# Patient Record
Sex: Female | Born: 1935 | Race: White | Hispanic: No | Smoking: Former smoker
Health system: Southern US, Community
[De-identification: ages and names within clinical notes are randomized; demographics above are authoritative.]

## PROBLEM LIST (undated history)

## (undated) DIAGNOSIS — L03039 Cellulitis of unspecified toe: Secondary | ICD-10-CM

## (undated) DIAGNOSIS — F329 Major depressive disorder, single episode, unspecified: Secondary | ICD-10-CM

## (undated) DIAGNOSIS — D649 Anemia, unspecified: Secondary | ICD-10-CM

## (undated) DIAGNOSIS — I639 Cerebral infarction, unspecified: Secondary | ICD-10-CM

## (undated) DIAGNOSIS — I219 Acute myocardial infarction, unspecified: Secondary | ICD-10-CM

## (undated) DIAGNOSIS — I669 Occlusion and stenosis of unspecified cerebral artery: Secondary | ICD-10-CM

## (undated) DIAGNOSIS — N390 Urinary tract infection, site not specified: Secondary | ICD-10-CM

## (undated) DIAGNOSIS — F0390 Unspecified dementia without behavioral disturbance: Secondary | ICD-10-CM

## (undated) DIAGNOSIS — E785 Hyperlipidemia, unspecified: Secondary | ICD-10-CM

## (undated) DIAGNOSIS — K219 Gastro-esophageal reflux disease without esophagitis: Secondary | ICD-10-CM

## (undated) DIAGNOSIS — J45909 Unspecified asthma, uncomplicated: Secondary | ICD-10-CM

## (undated) DIAGNOSIS — N179 Acute kidney failure, unspecified: Secondary | ICD-10-CM

## (undated) DIAGNOSIS — I10 Essential (primary) hypertension: Secondary | ICD-10-CM

## (undated) DIAGNOSIS — M1990 Unspecified osteoarthritis, unspecified site: Secondary | ICD-10-CM

## (undated) DIAGNOSIS — R609 Edema, unspecified: Secondary | ICD-10-CM

## (undated) HISTORY — PX: CARDIAC SURGERY: SHX584

## (undated) HISTORY — DX: Cellulitis of unspecified toe: L03.039

## (undated) HISTORY — DX: Acute myocardial infarction, unspecified: I21.9

## (undated) HISTORY — DX: Edema, unspecified: R60.9

## (undated) HISTORY — DX: Acute kidney failure, unspecified: N17.9

## (undated) HISTORY — PX: KNEE SURGERY: SHX244

## (undated) HISTORY — DX: Occlusion and stenosis of unspecified cerebral artery: I66.9

## (undated) HISTORY — DX: Urinary tract infection, site not specified: N39.0

## (undated) HISTORY — DX: Unspecified osteoarthritis, unspecified site: M19.90

## (undated) HISTORY — DX: Cerebral infarction, unspecified: I63.9

## (undated) HISTORY — DX: Unspecified asthma, uncomplicated: J45.909

## (undated) HISTORY — DX: Unspecified dementia, unspecified severity, without behavioral disturbance, psychotic disturbance, mood disturbance, and anxiety: F03.90

## (undated) HISTORY — DX: Anemia, unspecified: D64.9

---

## 2004-01-21 ENCOUNTER — Other Ambulatory Visit: Payer: Self-pay

## 2004-05-29 ENCOUNTER — Ambulatory Visit: Payer: Self-pay | Admitting: Internal Medicine

## 2006-01-22 ENCOUNTER — Ambulatory Visit: Payer: Self-pay | Admitting: Internal Medicine

## 2009-01-17 ENCOUNTER — Ambulatory Visit: Payer: Self-pay | Admitting: Internal Medicine

## 2009-02-21 ENCOUNTER — Other Ambulatory Visit: Payer: Self-pay | Admitting: Internal Medicine

## 2009-07-11 ENCOUNTER — Ambulatory Visit: Payer: Self-pay | Admitting: Internal Medicine

## 2010-03-01 ENCOUNTER — Encounter: Payer: Self-pay | Admitting: Internal Medicine

## 2010-06-27 ENCOUNTER — Ambulatory Visit: Payer: Medicare Other | Admitting: Family Medicine

## 2010-06-27 ENCOUNTER — Encounter: Payer: Self-pay | Admitting: Family Medicine

## 2010-06-27 DIAGNOSIS — H6120 Impacted cerumen, unspecified ear: Secondary | ICD-10-CM

## 2010-07-06 NOTE — Assessment & Plan Note (Signed)
Summary: EAR CLEANING/EVM   Vital Signs:  Patient Profile:   77 Years Old Female CC:      Feels like Ears are closed up. Height:     60 inches Weight:      170 pounds BMI:     33.32 O2 Sat:      97 % O2 treatment:    Room Air Temp:     98.2 degrees F oral Pulse rate:   73 / minute Pulse rhythm:   regular Resp:     18 per minute BP sitting:   135 / 78  (right arm)  Pt. in pain?   no  Vitals Entered By: Levonne Spiller EMT-P (June 27, 2010 5:00 PM)              Is Patient Diabetic? No  Does patient need assistance? Functional Status Self care Ambulation Normal      Current Allergies (reviewed today): ! * CODIENEHistory of Present Illness History from: patient Reason for visit: see chief complaint Chief Complaint: Feels like Ears are clogged up. History of Present Illness: The patient presented with her daughter because they were concerned that the patient had a buildup of wax in the ears.  She has had some decreased hearing sensation and was concerned.  No other problems to report at this time.  The patient did not bring or was able to recall her significant medical history including the medications that she was taking.  The daughter was also unable to provide the information.    REVIEW OF SYSTEMS Constitutional Symptoms      Denies fever, chills, night sweats, weight loss, weight gain, and fatigue.  Eyes       Denies change in vision, eye pain, eye discharge, glasses, contact lenses, and eye surgery. Ear/Nose/Throat/Mouth       Complains of hearing loss/aids.      Denies change in hearing, ear pain, ear discharge, dizziness, frequent runny nose, frequent nose bleeds, sinus problems, sore throat, hoarseness, and tooth pain or bleeding.      Comments: possible cerumen impaction Respiratory       Denies dry cough, productive cough, wheezing, shortness of breath, asthma, bronchitis, and emphysema/COPD.  Cardiovascular       Denies murmurs, chest pain, and tires easily  with exhertion.    Gastrointestinal       Denies stomach pain, nausea/vomiting, diarrhea, constipation, blood in bowel movements, and indigestion. Genitourniary       Denies painful urination, blood or discharge from vagina, kidney stones, and loss of urinary control. Neurological       Denies paralysis, seizures, and fainting/blackouts. Musculoskeletal       Denies muscle pain, joint pain, joint stiffness, decreased range of motion, redness, swelling, muscle weakness, and gout.  Skin       Denies bruising, unusual mles/lumps or sores, and hair/skin or nail changes.  Psych       Denies mood changes, temper/anger issues, anxiety/stress, speech problems, depression, and sleep problems.  Past History:  Past Medical History: Pt and daughter unable to recall  Past Surgical History: Pt and daughter present unable to recall  Family History: Pt denies significant family health problems.   Social History: Pt is 77 years old, living with daughters who provide care for her.   Physical Exam General appearance: well developed, well nourished, no acute distress Head: normocephalic, atraumatic Eyes: conjunctivae and lids normal Pupils: equal, round, reactive to light Ears: excessive cerumen bilateral Nasal: mild edema of the  turbinates Oral/Pharynx: moist mucous membranes Neurological: grossly intact and non-focal MSE: oriented to time, place, and person Assessment New Problems: CERUMEN IMPACTION, BILATERAL (ICD-380.4)   Patient Education: The risks, benefits and possible side effects were clearly explained and discussed with the patient.  The patient verbalized clear understanding.  The patient was given instructions to return if symptoms don't improve, worsen or new changes develop.  If it is not during clinic hours and the patient cannot get back to this clinic then the patient was told to seek medical care at an available urgent care or emergency department.  The patient verbalized  understanding.    Plan Planning Comments:   Ear lavage performed.  Follow Up: Follow up on an as needed basis, Follow up with Primary Physician  The patient and/or caregiver has been counseled thoroughly with regard to medications prescribed including dosage, schedule, interactions, rationale for use, and possible side effects and they verbalize understanding.  Diagnoses and expected course of recovery discussed and will return if not improved as expected or if the condition worsens. Patient and/or caregiver verbalized understanding.   PROCEDURE: Follow up: Bilateral Ear Lavage Performed in the office with gentle warm water and peroxide.  Large amounts of dried and hardened ear wax removed from both ears. Pt tolerated the procedure very well.  She said she felt much better after the procedure.  Both auditory canals and TMs were clear after visual inspection.  Care instructions were provided for the patient. The patient and her daughter verbalized clear understanding.    Patient Instructions: 1)  Be careful when walking and ambulating because you may be slightly dizzy or unsteady on your feet after having this procedure done.  Be EXTRA careful for the next 3 hours.  2)  See your primary care physician as scheduled for your next regular follow up exam. 3)  The patient was informed that there is no on-call provider or services available at this clinic during off-hours (when the clinic is closed).  If the patient developed a problem or concern that required immediate attention, the patient was advised to go the the nearest available urgent care or emergency department for medical care.  The patient verbalized understanding.

## 2011-05-24 LAB — CBC
HCT: 30.4 % — ABNORMAL LOW (ref 35.0–47.0)
HGB: 10.2 g/dL — ABNORMAL LOW (ref 12.0–16.0)
MCHC: 33.6 g/dL (ref 32.0–36.0)
MCV: 90 fL (ref 80–100)
Platelet: 215 10*3/uL (ref 150–440)
RDW: 13.8 % (ref 11.5–14.5)
WBC: 6.6 10*3/uL (ref 3.6–11.0)

## 2011-05-24 LAB — COMPREHENSIVE METABOLIC PANEL
Albumin: 3.1 g/dL — ABNORMAL LOW (ref 3.4–5.0)
Anion Gap: 10 (ref 7–16)
BUN: 20 mg/dL — ABNORMAL HIGH (ref 7–18)
Calcium, Total: 8.1 mg/dL — ABNORMAL LOW (ref 8.5–10.1)
Chloride: 92 mmol/L — ABNORMAL LOW (ref 98–107)
Co2: 32 mmol/L (ref 21–32)
EGFR (African American): 52 — ABNORMAL LOW
EGFR (Non-African Amer.): 43 — ABNORMAL LOW
Potassium: 3.5 mmol/L (ref 3.5–5.1)
SGOT(AST): 16 U/L (ref 15–37)
SGPT (ALT): 18 U/L
Total Protein: 6.4 g/dL (ref 6.4–8.2)

## 2011-05-24 LAB — TROPONIN I: Troponin-I: <0.02 ng/mL

## 2011-05-24 LAB — RAPID INFLUENZA A&B ANTIGENS

## 2011-05-25 ENCOUNTER — Inpatient Hospital Stay: Payer: Self-pay | Admitting: Student

## 2011-05-25 LAB — CK TOTAL AND CKMB (NOT AT ARMC)
CK, Total: 62 U/L (ref 21–215)
CK, Total: 58 U/L (ref 21–215)
CK-MB: 0.7 ng/mL (ref 0.5–3.6)

## 2011-05-25 LAB — URINALYSIS, COMPLETE
Blood: NEGATIVE
Hyaline Cast: 7
Ketone: NEGATIVE
Ph: 7 (ref 4.5–8.0)
Protein: NEGATIVE
Specific Gravity: 1.006 (ref 1.003–1.030)
WBC UR: 2 /HPF (ref 0–5)

## 2011-05-25 LAB — TROPONIN I: Troponin-I: <0.02 ng/mL

## 2011-05-26 LAB — CBC WITH DIFFERENTIAL/PLATELET
Basophil #: 0 10*3/uL (ref 0.0–0.1)
Basophil %: 0.1 %
Eosinophil #: 0 10*3/uL (ref 0.0–0.7)
HCT: 28.3 % — ABNORMAL LOW (ref 35.0–47.0)
HGB: 9.4 g/dL — ABNORMAL LOW (ref 12.0–16.0)
Lymphocyte %: 8.7 %
MCHC: 33.2 g/dL (ref 32.0–36.0)
Monocyte #: 1.1 10*3/uL — ABNORMAL HIGH (ref 0.0–0.7)
Monocyte %: 8.9 %
Neutrophil #: 9.7 10*3/uL — ABNORMAL HIGH (ref 1.4–6.5)
Neutrophil %: 82.2 %
RDW: 14.1 % (ref 11.5–14.5)
WBC: 11.8 10*3/uL — ABNORMAL HIGH (ref 3.6–11.0)

## 2011-05-26 LAB — MAGNESIUM: Magnesium: 1.8 mg/dL

## 2011-05-26 LAB — BASIC METABOLIC PANEL
Anion Gap: 10 (ref 7–16)
BUN: 20 mg/dL — ABNORMAL HIGH (ref 7–18)
Chloride: 94 mmol/L — ABNORMAL LOW (ref 98–107)
Co2: 29 mmol/L (ref 21–32)
Creatinine: 1.1 mg/dL (ref 0.60–1.30)
EGFR (African American): >60
Potassium: 3.4 mmol/L — ABNORMAL LOW (ref 3.5–5.1)
Sodium: 133 mmol/L — ABNORMAL LOW (ref 136–145)

## 2011-05-26 LAB — LIPID PANEL
HDL Cholesterol: 39 mg/dL — ABNORMAL LOW (ref 40–60)
Triglycerides: 111 mg/dL (ref 0–200)
VLDL Cholesterol, Calc: 22 mg/dL (ref 5–40)

## 2011-05-26 LAB — PROTIME-INR: INR: 1.1

## 2011-05-26 LAB — TSH: Thyroid Stimulating Horm: 1.03 u[IU]/mL

## 2011-05-26 LAB — HEMOGLOBIN A1C: Hemoglobin A1C: 5.9 % (ref 4.2–6.3)

## 2011-05-27 LAB — BASIC METABOLIC PANEL
Calcium, Total: 8.3 mg/dL — ABNORMAL LOW (ref 8.5–10.1)
Chloride: 94 mmol/L — ABNORMAL LOW (ref 98–107)
Creatinine: 1.21 mg/dL (ref 0.60–1.30)
EGFR (Non-African Amer.): 45 — ABNORMAL LOW
Glucose: 86 mg/dL (ref 65–99)
Osmolality: 268 (ref 275–301)
Potassium: 3.7 mmol/L (ref 3.5–5.1)
Sodium: 133 mmol/L — ABNORMAL LOW (ref 136–145)

## 2011-05-27 LAB — CBC WITH DIFFERENTIAL/PLATELET
Basophil #: 0 10*3/uL (ref 0.0–0.1)
Eosinophil #: 0.3 10*3/uL (ref 0.0–0.7)
HCT: 28.9 % — ABNORMAL LOW (ref 35.0–47.0)
Lymphocyte %: 16.6 %
MCH: 30.3 pg (ref 26.0–34.0)
Monocyte #: 0.9 10*3/uL — ABNORMAL HIGH (ref 0.0–0.7)
Monocyte %: 9.2 %
RDW: 14.4 % (ref 11.5–14.5)
WBC: 9.7 10*3/uL (ref 3.6–11.0)

## 2011-05-28 LAB — BASIC METABOLIC PANEL
BUN: 21 mg/dL — ABNORMAL HIGH (ref 7–18)
Calcium, Total: 8.1 mg/dL — ABNORMAL LOW (ref 8.5–10.1)
Chloride: 90 mmol/L — ABNORMAL LOW (ref 98–107)
Co2: 31 mmol/L (ref 21–32)
Creatinine: 1.2 mg/dL (ref 0.60–1.30)
EGFR (African American): 55 — ABNORMAL LOW
Osmolality: 266 (ref 275–301)
Potassium: 3.3 mmol/L — ABNORMAL LOW (ref 3.5–5.1)

## 2012-08-28 ENCOUNTER — Emergency Department: Payer: Self-pay | Admitting: Emergency Medicine

## 2012-10-17 LAB — URINALYSIS, COMPLETE
Bacteria: NONE SEEN
Bilirubin,UR: NEGATIVE
Blood: NEGATIVE
Glucose,UR: NEGATIVE mg/dL (ref 0–75)
Ketone: NEGATIVE
Leukocyte Esterase: NEGATIVE
Protein: NEGATIVE
Specific Gravity: 1.005 (ref 1.003–1.030)

## 2012-10-17 LAB — CBC
HGB: 9.7 g/dL — ABNORMAL LOW (ref 12.0–16.0)
MCH: 24.3 pg — ABNORMAL LOW (ref 26.0–34.0)
MCV: 75 fL — ABNORMAL LOW (ref 80–100)
Platelet: 279 10*3/uL (ref 150–440)
RBC: 4.01 10*6/uL (ref 3.80–5.20)

## 2012-10-17 LAB — HEPATIC FUNCTION PANEL A (ARMC)
Alkaline Phosphatase: 69 U/L (ref 50–136)
Bilirubin,Total: 0.4 mg/dL (ref 0.2–1.0)
Total Protein: 7.1 g/dL (ref 6.4–8.2)

## 2012-10-17 LAB — BASIC METABOLIC PANEL
Anion Gap: 5 — ABNORMAL LOW (ref 7–16)
Chloride: 89 mmol/L — ABNORMAL LOW (ref 98–107)
Creatinine: 1.07 mg/dL (ref 0.60–1.30)
EGFR (African American): 54 — ABNORMAL LOW
EGFR (Non-African Amer.): 46 — ABNORMAL LOW
Sodium: 125 mmol/L — ABNORMAL LOW (ref 136–145)

## 2012-10-17 LAB — TROPONIN I: Troponin-I: <0.02 ng/mL

## 2012-10-17 LAB — PRO B NATRIURETIC PEPTIDE: B-Type Natriuretic Peptide: 3229 pg/mL — ABNORMAL HIGH (ref 0–450)

## 2012-10-18 ENCOUNTER — Inpatient Hospital Stay: Payer: Self-pay | Admitting: Internal Medicine

## 2012-10-18 LAB — CK TOTAL AND CKMB (NOT AT ARMC)
CK, Total: 109 U/L (ref 21–215)
CK, Total: 73 U/L (ref 21–215)
CK-MB: 1.5 ng/mL (ref 0.5–3.6)

## 2012-10-18 LAB — TROPONIN I: Troponin-I: <0.02 ng/mL

## 2012-10-19 LAB — IRON AND TIBC
Iron Bind.Cap.(Total): 406 ug/dL (ref 250–450)
Iron Saturation: 6 %
Iron: 23 ug/dL — ABNORMAL LOW (ref 50–170)
Unbound Iron-Bind.Cap.: 383 ug/dL

## 2012-10-19 LAB — TSH: Thyroid Stimulating Horm: 0.378 u[IU]/mL — ABNORMAL LOW

## 2012-10-19 LAB — BASIC METABOLIC PANEL
Anion Gap: 5 — ABNORMAL LOW (ref 7–16)
Creatinine: 1.43 mg/dL — ABNORMAL HIGH (ref 0.60–1.30)
EGFR (African American): 38 — ABNORMAL LOW
EGFR (Non-African Amer.): 33 — ABNORMAL LOW
Glucose: 148 mg/dL — ABNORMAL HIGH (ref 65–99)
Osmolality: 272 (ref 275–301)
Sodium: 130 mmol/L — ABNORMAL LOW (ref 136–145)

## 2012-10-19 LAB — FERRITIN: Ferritin (ARMC): 11 ng/mL (ref 8–388)

## 2012-10-19 LAB — HEMOGLOBIN A1C: Hemoglobin A1C: 6.1 % (ref 4.2–6.3)

## 2012-10-19 LAB — MAGNESIUM: Magnesium: 1.9 mg/dL

## 2012-10-19 LAB — T4, FREE: Free Thyroxine: 1.22 ng/dL (ref 0.76–1.46)

## 2012-10-20 LAB — BASIC METABOLIC PANEL
Anion Gap: 8 (ref 7–16)
Calcium, Total: 8.1 mg/dL — ABNORMAL LOW (ref 8.5–10.1)
Creatinine: 1.45 mg/dL — ABNORMAL HIGH (ref 0.60–1.30)
EGFR (African American): 37 — ABNORMAL LOW
EGFR (Non-African Amer.): 32 — ABNORMAL LOW
Glucose: 140 mg/dL — ABNORMAL HIGH (ref 65–99)
Osmolality: 268 (ref 275–301)
Potassium: 3.2 mmol/L — ABNORMAL LOW (ref 3.5–5.1)

## 2012-10-21 LAB — BASIC METABOLIC PANEL
Anion Gap: 6 — ABNORMAL LOW (ref 7–16)
Calcium, Total: 8.3 mg/dL — ABNORMAL LOW (ref 8.5–10.1)
Creatinine: 1.18 mg/dL (ref 0.60–1.30)
EGFR (African American): 48 — ABNORMAL LOW
EGFR (Non-African Amer.): 41 — ABNORMAL LOW
Osmolality: 266 (ref 275–301)

## 2012-10-21 LAB — HEMOGLOBIN: HGB: 9.1 g/dL — ABNORMAL LOW (ref 12.0–16.0)

## 2012-10-22 LAB — BASIC METABOLIC PANEL
BUN: 26 mg/dL — ABNORMAL HIGH (ref 7–18)
Calcium, Total: 8.2 mg/dL — ABNORMAL LOW (ref 8.5–10.1)
Chloride: 91 mmol/L — ABNORMAL LOW (ref 98–107)
EGFR (African American): >60
Glucose: 101 mg/dL — ABNORMAL HIGH (ref 65–99)

## 2012-10-23 LAB — BASIC METABOLIC PANEL
BUN: 22 mg/dL — ABNORMAL HIGH (ref 7–18)
Co2: 33 mmol/L — ABNORMAL HIGH (ref 21–32)
EGFR (African American): >60
Glucose: 93 mg/dL (ref 65–99)
Sodium: 126 mmol/L — ABNORMAL LOW (ref 136–145)

## 2012-10-23 LAB — CULTURE, BLOOD (SINGLE)

## 2012-10-24 LAB — BASIC METABOLIC PANEL
Anion Gap: 5 — ABNORMAL LOW (ref 7–16)
BUN: 21 mg/dL — ABNORMAL HIGH (ref 7–18)
Chloride: 91 mmol/L — ABNORMAL LOW (ref 98–107)
Co2: 32 mmol/L (ref 21–32)
EGFR (Non-African Amer.): >60
Glucose: 106 mg/dL — ABNORMAL HIGH (ref 65–99)
Osmolality: 260 (ref 275–301)

## 2013-05-16 ENCOUNTER — Emergency Department: Payer: Self-pay | Admitting: Emergency Medicine

## 2013-05-16 LAB — BASIC METABOLIC PANEL
Anion Gap: 1 — ABNORMAL LOW (ref 7–16)
BUN: 18 mg/dL (ref 7–18)
Calcium, Total: 8.4 mg/dL — ABNORMAL LOW (ref 8.5–10.1)
Creatinine: 1.08 mg/dL (ref 0.60–1.30)
Osmolality: 277 (ref 275–301)
Potassium: 4 mmol/L (ref 3.5–5.1)

## 2013-05-16 LAB — CBC
HGB: 11.9 g/dL — ABNORMAL LOW (ref 12.0–16.0)
MCH: 31.2 pg (ref 26.0–34.0)
MCHC: 33.2 g/dL (ref 32.0–36.0)
MCV: 94 fL (ref 80–100)
Platelet: 186 10*3/uL (ref 150–440)
RBC: 3.83 10*6/uL (ref 3.80–5.20)
RDW: 14.3 % (ref 11.5–14.5)

## 2013-05-20 ENCOUNTER — Emergency Department: Payer: Self-pay | Admitting: Emergency Medicine

## 2013-05-20 LAB — BASIC METABOLIC PANEL
BUN: 20 mg/dL — ABNORMAL HIGH (ref 7–18)
Calcium, Total: 8.6 mg/dL (ref 8.5–10.1)
Chloride: 99 mmol/L (ref 98–107)
EGFR (Non-African Amer.): 46 — ABNORMAL LOW
Glucose: 127 mg/dL — ABNORMAL HIGH (ref 65–99)
Osmolality: 272 (ref 275–301)
Potassium: 4 mmol/L (ref 3.5–5.1)
Sodium: 134 mmol/L — ABNORMAL LOW (ref 136–145)

## 2013-05-20 LAB — CBC
HGB: 12.6 g/dL (ref 12.0–16.0)
MCH: 31.5 pg (ref 26.0–34.0)
MCV: 94 fL (ref 80–100)
Platelet: 204 10*3/uL (ref 150–440)
RBC: 4 10*6/uL (ref 3.80–5.20)
RDW: 14.2 % (ref 11.5–14.5)
WBC: 7.6 10*3/uL (ref 3.6–11.0)

## 2013-06-04 ENCOUNTER — Emergency Department (HOSPITAL_COMMUNITY): Payer: Medicare PPO

## 2013-06-04 ENCOUNTER — Inpatient Hospital Stay (HOSPITAL_COMMUNITY): Payer: Medicare PPO

## 2013-06-04 ENCOUNTER — Inpatient Hospital Stay (HOSPITAL_COMMUNITY)
Admission: EM | Admit: 2013-06-04 | Discharge: 2013-06-09 | DRG: 065 | Disposition: A | Discharge status: Skilled Nursing Facility | Payer: Medicare PPO | Attending: Internal Medicine | Admitting: Internal Medicine

## 2013-06-04 ENCOUNTER — Encounter (HOSPITAL_COMMUNITY): Payer: Self-pay | Admitting: Emergency Medicine

## 2013-06-04 DIAGNOSIS — F329 Major depressive disorder, single episode, unspecified: Secondary | ICD-10-CM | POA: Diagnosis present

## 2013-06-04 DIAGNOSIS — I639 Cerebral infarction, unspecified: Secondary | ICD-10-CM | POA: Diagnosis present

## 2013-06-04 DIAGNOSIS — N179 Acute kidney failure, unspecified: Secondary | ICD-10-CM | POA: Diagnosis present

## 2013-06-04 DIAGNOSIS — F32A Depression, unspecified: Secondary | ICD-10-CM | POA: Diagnosis present

## 2013-06-04 DIAGNOSIS — I509 Heart failure, unspecified: Secondary | ICD-10-CM | POA: Diagnosis present

## 2013-06-04 DIAGNOSIS — F3289 Other specified depressive episodes: Secondary | ICD-10-CM | POA: Diagnosis present

## 2013-06-04 DIAGNOSIS — D72829 Elevated white blood cell count, unspecified: Secondary | ICD-10-CM | POA: Diagnosis present

## 2013-06-04 DIAGNOSIS — K219 Gastro-esophageal reflux disease without esophagitis: Secondary | ICD-10-CM | POA: Diagnosis present

## 2013-06-04 DIAGNOSIS — E785 Hyperlipidemia, unspecified: Secondary | ICD-10-CM | POA: Diagnosis present

## 2013-06-04 DIAGNOSIS — I10 Essential (primary) hypertension: Secondary | ICD-10-CM | POA: Diagnosis present

## 2013-06-04 DIAGNOSIS — G8190 Hemiplegia, unspecified affecting unspecified side: Secondary | ICD-10-CM | POA: Diagnosis present

## 2013-06-04 DIAGNOSIS — J4489 Other specified chronic obstructive pulmonary disease: Secondary | ICD-10-CM | POA: Diagnosis present

## 2013-06-04 DIAGNOSIS — J449 Chronic obstructive pulmonary disease, unspecified: Secondary | ICD-10-CM | POA: Diagnosis present

## 2013-06-04 DIAGNOSIS — G459 Transient cerebral ischemic attack, unspecified: Secondary | ICD-10-CM

## 2013-06-04 DIAGNOSIS — I959 Hypotension, unspecified: Secondary | ICD-10-CM | POA: Diagnosis present

## 2013-06-04 DIAGNOSIS — I6340 Cerebral infarction due to embolism of unspecified cerebral artery: Principal | ICD-10-CM | POA: Diagnosis present

## 2013-06-04 HISTORY — DX: Hyperlipidemia, unspecified: E78.5

## 2013-06-04 HISTORY — DX: Major depressive disorder, single episode, unspecified: F32.9

## 2013-06-04 HISTORY — DX: Gastro-esophageal reflux disease without esophagitis: K21.9

## 2013-06-04 HISTORY — DX: Essential (primary) hypertension: I10

## 2013-06-04 LAB — BASIC METABOLIC PANEL
BUN: 24 mg/dL — AB (ref 6–23)
CALCIUM: 8.7 mg/dL (ref 8.4–10.5)
CO2: 30 mEq/L (ref 19–32)
CREATININE: 1.24 mg/dL — AB (ref 0.50–1.10)
Chloride: 94 mEq/L — ABNORMAL LOW (ref 96–112)
GFR, EST AFRICAN AMERICAN: 43 mL/min — AB (ref >90)
GFR, EST NON AFRICAN AMERICAN: 37 mL/min — AB (ref >90)
Glucose, Bld: 114 mg/dL — ABNORMAL HIGH (ref 70–99)
Potassium: 4.2 mEq/L (ref 3.7–5.3)
Sodium: 138 mEq/L (ref 137–147)

## 2013-06-04 LAB — URINALYSIS, ROUTINE W REFLEX MICROSCOPIC
BILIRUBIN URINE: NEGATIVE
Glucose, UA: NEGATIVE mg/dL
Hgb urine dipstick: NEGATIVE
KETONES UR: NEGATIVE mg/dL
LEUKOCYTES UA: NEGATIVE
NITRITE: NEGATIVE
Protein, ur: NEGATIVE mg/dL
Specific Gravity, Urine: 1.012 (ref 1.005–1.030)
Urobilinogen, UA: 0.2 mg/dL (ref 0.0–1.0)
pH: 6.5 (ref 5.0–8.0)

## 2013-06-04 LAB — CBC
HEMATOCRIT: 40.2 % (ref 36.0–46.0)
Hemoglobin: 13.2 g/dL (ref 12.0–15.0)
MCH: 31.4 pg (ref 26.0–34.0)
MCHC: 32.8 g/dL (ref 30.0–36.0)
MCV: 95.7 fL (ref 78.0–100.0)
Platelets: 194 10*3/uL (ref 150–400)
RBC: 4.2 MIL/uL (ref 3.87–5.11)
RDW: 14.2 % (ref 11.5–15.5)
WBC: 11.1 10*3/uL — ABNORMAL HIGH (ref 4.0–10.5)

## 2013-06-04 LAB — PROTIME-INR
INR: 1.02 (ref 0.00–1.49)
Prothrombin Time: 13.2 seconds (ref 11.6–15.2)

## 2013-06-04 LAB — APTT: aPTT: 23 seconds — ABNORMAL LOW (ref 24–37)

## 2013-06-04 LAB — GLUCOSE, CAPILLARY: Glucose-Capillary: 113 mg/dL — ABNORMAL HIGH (ref 70–99)

## 2013-06-04 LAB — POCT I-STAT TROPONIN I: TROPONIN I, POC: 0 ng/mL (ref 0.00–0.08)

## 2013-06-04 LAB — TROPONIN I: Troponin I: <0.3 ng/mL (ref <0.30)

## 2013-06-04 MED ORDER — ASPIRIN 300 MG RE SUPP
300.0000 mg | Freq: Every day | RECTAL | Status: DC
  Filled 2013-06-04 (×2): qty 1

## 2013-06-04 NOTE — Progress Notes (Signed)
Unit CM UR Completed by [facility] ED CM  W. Jaisa Defino RN  

## 2013-06-04 NOTE — H&P (Signed)
[facility] History and Physical  Daisy [name] ZOX:[id number] DOB: 08-01-1921 DOA: 06/04/2013  Referring physician: ER physician PCP: No primary provider on file.   Chief Complaint: right facial droop  HPI:  Pt is 80 yo relatively healthy female with no specific medical problems [facility] with main concern of sudden onset right side facial droop that she first noticed 3-4 hours prior to this admission. This has resolved by the time she has arrived to ED. Pt denies similar events in the past, no fevers, chills, no shortness of breath and no chest pain, no abdominal or urinary concerns. Pt denies any specific symptoms prior to this event.  In ED, no focal neurological deficits noted. [facility] asked to admit for TIA work up.   Assessment and Plan: Right facial droop  - admit to telemetry bed for TIA work up - order MRI brain and 2D ECHO, carotid dopplers - check Lipid panel and A1C - monitor vitals on telemetry - PT/OT/SLP evaluation - aspirin PO once pt passes swallow evaluation  Acute renal failure - likely pre renal - place on IVF and repeat BMP in AM Leukocytosis - likely from stress reaction, demargination - no signs of acute infectious etiology - will check UA and CXR - repeat CBC in AM  Radiological Exams on Admission: Ct Head Wo Contrast   06/04/2013    1. No acute intracranial abnormality.  2. Moderate age-appropriate cortical and deep atrophy and severe chronic microvascular ischemic changes of the white matter.  EKG: Normal sinus rhythm, no ST/T wave changes  Code Status: Full Family Communication: Pt at bedside Disposition Plan: Admit for further evaluation  Manson PasseyEVINE, Daisy Butkiewicz, MD  [facility] Pager [phone]  Review of Systems:  Constitutional: Negative for diaphoresis.  HENT: Negative for hearing loss, ear pain, nosebleeds, congestion, sore throat, neck pain, tinnitus and ear discharge.   Eyes: Negative for blurred vision, double vision,  photophobia, pain, discharge and redness.  Respiratory: Negative for cough, hemoptysis, sputum production, shortness of breath, wheezing and stridor.   Cardiovascular: Negative for chest pain, palpitations, orthopnea, claudication and leg swelling.  Gastrointestinal: Negative for nausea, vomiting and abdominal pain. Negative for heartburn, constipation, blood in stool and melena.  Genitourinary: Negative for dysuria, urgency, frequency, hematuria and flank pain.  Musculoskeletal: Negative for myalgias, back pain, joint pain and falls.  Skin: Negative for itching and rash.  Neurological: Per HPI  Endo/Heme/Allergies: Negative for environmental allergies and polydipsia. Does not bruise/bleed easily.  Psychiatric/Behavioral: Negative for suicidal ideas. The patient is not nervous/anxious.      History reviewed. No pertinent past medical history. History reviewed. No pertinent past surgical history. Social History:  reports that she has never smoked. She has never used smokeless tobacco. She reports that she does not drink alcohol or use illicit drugs.  Allergies  Allergen Reactions  . Codeine     REACTION: Pt. unsure.    Family History: no history of cancers, no cardiovascular diseases on mother or father side  Prior to Admission medications   Not on File   Physical Exam: There were no vitals filed for this visit.  Physical Exam  Constitutional: Appears well-developed and well-nourished. No distress.  HENT: Normocephalic. External right and left ear normal. Oropharynx is clear and moist.  Eyes: Conjunctivae and EOM are normal. PERRLA, no scleral icterus.  Neck: Normal ROM. Neck supple. No JVD. No tracheal deviation. No thyromegaly.  CVS: RRR, S1/S2 +, no murmurs, no gallops, no carotid bruit.  Pulmonary: Effort and breath  sounds normal, no stridor, rhonchi, wheezes, rales.  Abdominal: Soft. BS +,  no distension, tenderness, rebound or guarding.  Musculoskeletal: Normal range of  motion. No edema and no tenderness.  Lymphadenopathy: No lymphadenopathy noted, cervical, inguinal. Neuro: Alert. Normal reflexes, muscle tone coordination. No cranial nerve deficit. Skin: Skin is warm and dry. No rash noted. Not diaphoretic. No erythema. No pallor.  Psychiatric: Normal mood and affect. Behavior, judgment, thought content normal.   Labs on Admission:  Basic Metabolic Panel:  Recent Labs Lab 06/04/13 1931  NA 138  K 4.2  CL 94*  CO2 30  GLUCOSE 114*  BUN 24*  CREATININE 1.24*  CALCIUM 8.7   CBC:  Recent Labs Lab 06/04/13 1931  WBC 11.1*  HGB 13.2  HCT 40.2  MCV 95.7  PLT 194   Cardiac Enzymes:  Recent Labs Lab 06/04/13 1931  TROPONINI <0.30   CBG:  Recent Labs Lab 06/04/13 1953  GLUCAP 113*    If 7PM-7AM, please contact night-coverage [email or web address] Password [email or web address] 06/04/2013, 8:12 PM

## 2013-06-04 NOTE — ED Notes (Signed)
Patient coming from home. Daughter was giving patient a bath at approximately 1500 this afternoon and discovered patient had right sided weakness, right facial droop and that patient was more confused than normal. EMS states that the patient had slurred speech upon their arrival. CBG 128 and patient received 234 asa at 1700. Patient is alert, but disoriented. Airway intact.

## 2013-06-04 NOTE — ED Provider Notes (Signed)
CSN: [id number]     Arrival date & time 06/04/13  1926 History   First MD Initiated Contact with Patient 06/04/13 1932     No chief complaint on file.  (Consider location/radiation/quality/duration/timing/severity/associated sxs/prior Treatment) HPI Comments: Daughter was bathing her and she had acute onset of R sided weakness while bathing. Initially had flaccid paralysis on the R, improved with EMS.  Patient is a 80 y.o. female presenting with neurologic complaint. The history is provided by the patient.  Neurologic Problem This is a new problem. The current episode started 3 to 5 hours ago. The problem occurs constantly. The problem has not changed since onset.Pertinent negatives include no abdominal pain and no shortness of breath. Nothing aggravates the symptoms. Nothing relieves the symptoms. She has tried nothing for the symptoms.    No past medical history on file. No past surgical history on file. No family history on file. History  Substance Use Topics  . Smoking status: Not on file  . Smokeless tobacco: Not on file  . Alcohol Use: Not on file   OB History   No data available     Review of Systems  Constitutional: Negative for fever.  Respiratory: Negative for cough and shortness of breath.   Gastrointestinal: Negative for vomiting and abdominal pain.  All other systems reviewed and are negative.    Allergies  Codeine  Home Medications  No current outpatient prescriptions on file. There were no vitals taken for this visit. Physical Exam  Nursing note and vitals reviewed. Constitutional: She is oriented to person, place, and time. She appears well-developed and well-nourished. No distress.  HENT:  Head: Normocephalic and atraumatic.  Eyes: EOM are normal. Pupils are equal, round, and reactive to light.  Neck: Normal range of motion. Neck supple.  Cardiovascular: Normal rate and regular rhythm.  Exam reveals no friction rub.   No murmur heard. Pulmonary/Chest:  Effort normal and breath sounds normal. No respiratory distress. She has no wheezes. She has no rales.  Abdominal: Soft. She exhibits no distension. There is no tenderness. There is no rebound.  Musculoskeletal: Normal range of motion. She exhibits no edema.  Neurological: She is alert and oriented to person, place, and time. No cranial nerve deficit or sensory deficit. She exhibits abnormal muscle tone (mild R sided weakness). GCS eye subscore is 4. GCS verbal subscore is 5. GCS motor subscore is 6.  Skin: She is not diaphoretic.    ED Course  Procedures (including critical care time) Labs Review Labs Reviewed  CBC - Abnormal; Notable for the following:    WBC 11.1 (*)    All other components within normal limits  BASIC METABOLIC PANEL - Abnormal; Notable for the following:    Chloride 94 (*)    Glucose, Bld 114 (*)    BUN 24 (*)    Creatinine, Ser 1.24 (*)    GFR calc non Af Amer 37 (*)    GFR calc Af Amer 43 (*)    All other components within normal limits  APTT - Abnormal; Notable for the following:    aPTT 23 (*)    All other components within normal limits  GLUCOSE, CAPILLARY - Abnormal; Notable for the following:    Glucose-Capillary 113 (*)    All other components within normal limits  PROTIME-INR  TROPONIN I  URINALYSIS, ROUTINE W REFLEX MICROSCOPIC  HEMOGLOBIN A1C  LIPID PANEL  POCT I-STAT TROPONIN I   Imaging Review Dg Chest 2 View  06/04/2013   CLINICAL  DATA:  Shortness of breath for 1 day.  EXAM: CHEST  2 VIEW  COMPARISON:  PA and lateral chest 05/20/2013.  FINDINGS: There is linear atelectasis in the lingula. The lungs are otherwise clear. Heart size is mildly enlarged. No pneumothorax or pleural effusion.  IMPRESSION: No acute disease.  Linear atelectasis in the lingula is noted.   Electronically Signed   By: Drusilla Kannerhomas  Dalessio M.D.   On: 06/04/2013 21:51   Ct Head Wo Contrast  06/04/2013   CLINICAL DATA:  Left-sided weakness. Acute mental status changes. Unable  to answer questions. Code stroke.  EXAM: CT HEAD WITHOUT CONTRAST  TECHNIQUE: Contiguous axial images were obtained from the base of the skull through the vertex without intravenous contrast.  COMPARISON:  None.  FINDINGS: Moderate age-appropriate cortical and deep atrophy. Severe changes of small vessel disease of the white matter diffusely, including the pons. Old lacunar strokes in both basal ganglia and both thalami. No mass lesion. No midline shift. No acute hemorrhage or hematoma. No extra-axial fluid collections. No evidence of acute infarction.  No focal osseous abnormality involving the skull. Visualized paranasal sinuses, bilateral mastoid air cells, and bilateral middle ear cavities well-aerated. bilateral carotid siphon and left vertebral artery atherosclerosis.  IMPRESSION: 1. No acute intracranial abnormality. 2. Moderate age-appropriate cortical and deep atrophy and severe chronic microvascular ischemic changes of the white matter. These results were called by telephone at the time of interpretation on 06/04/2013 at 7:54 PM to Dr. Cyril Mourningamillo of the stroke service, who verbally acknowledged these results.   Electronically Signed   By: Hulan Saashomas  Lawrence M.D.   On: 06/04/2013 19:55    EKG Interpretation    Date/Time:  Thursday June 04 2013 19:45:33 EST Ventricular Rate:  64 PR Interval:  230 QRS Duration: 150 QT Interval:  483 QTC Calculation: 498 R Axis:   -166 Text Interpretation:  Sinus or ectopic atrial rhythm Prolonged PR interval Nonspecific intraventricular conduction delay Anterolateral infarct, old No prior EKG Confirmed by Gwendolyn GrantWALDEN  MD, Kaveon Blatz ([id number]) on 06/04/2013 7:55:06 PM            MDM   1. TIA (transient ischemic attack)    80 year old female presents with right-sided flaccid paralysis. He has one half hours ago while her daughter was bathing her. Patient denied any headache, chest pain or shortness of breath. No history of strokes or diabetes. Only has history CHF.  Patient on arrival with open airway, lasting comfortably. Patient had stronger strength in the right with maybe mild weakness compared to the left. With much improvement, no concern for acute stroke and no TPA indications. Head CT without acute bleed. Neuro evaluated patient and believes she should be brought in for TIA workup   Dagmar HaitWilliam Kristal Perl, MD 06/05/13 [phone]

## 2013-06-04 NOTE — Consult Note (Addendum)
Referring Physician: ED/CODE STROKE    Chief Complaint: RIGHT HEMIPARESIS, CONFUSION  HPI:                                                                                                                                         Daisy [name] is an 80 y.o. female with a past medical history significant for CHF, HTN, COPD, brought in by EMS as a code stroke due to acute onset of the above stated symptoms. Last known well by family at 3 pm today, when his family was giving her a bath and noted that she was weak in the right side and seemed to be confused. Upon arrival to ED she was alert but confused and had initial NIHSS 6, with subsequent improvement while still in the ED. CT brain revealed no acute abnormality. At this time she denies HA, vertigo, double vision, difficulty swallowing, visual disturbances, chest pain, or palpitations.   Date last known well: 06/04/13 Time last known well: 3pm  tPA Given: no, out of the window for IV thrombolysis NIHSS: 6 MRS: 1  History reviewed. No pertinent past medical history.  History reviewed. No pertinent past surgical history.  No family history on file. Social History:  reports that she has never smoked. She has never used smokeless tobacco. She reports that she does not drink alcohol or use illicit drugs.  Allergies:  Allergies  Allergen Reactions  . Aricept [Donepezil Hcl]   . Codeine     REACTION: Pt. unsure.  Marland Kitchen Ketek [Telithromycin]     Medications:                                                                                                                           I have reviewed the patient's current medications.  ROS:  History obtained from chart review  General ROS: negative for - chills, fatigue, fever, night sweats, weight gain or weight loss Psychological ROS: negative for - behavioral  disorder, hallucinations, mood swings or suicidal ideation Ophthalmic ROS: negative for - blurry vision, double vision, eye pain or loss of vision ENT ROS: negative for - epistaxis, nasal discharge, oral lesions, sore throat, tinnitus or vertigo Allergy and Immunology ROS: negative for - hives or itchy/watery eyes Hematological and Lymphatic ROS: negative for - bleeding problems, bruising or swollen lymph nodes Endocrine ROS: negative for - galactorrhea, hair pattern changes, polydipsia/polyuria or temperature intolerance Respiratory ROS: negative for - cough, hemoptysis, shortness of breath or wheezing Cardiovascular ROS: negative for - chest pain, dyspnea on exertion, edema or irregular heartbeat Gastrointestinal ROS: negative for - abdominal pain, diarrhea, hematemesis, nausea/vomiting or stool incontinence Genito-Urinary ROS: negative for - dysuria, hematuria, incontinence or urinary frequency/urgency Musculoskeletal ROS: negative for - joint swelling or muscular weakness Neurological ROS: as noted in HPI Dermatological ROS: negative for rash and skin lesion changes  Physical exam: pleasant female in no apparent distress. Blood pressure 138/84, pulse 62, resp. rate 21, SpO2 95.00%. Head: normocephalic. Neck: supple, no bruits, no JVD. Cardiac: no murmurs. Lungs: clear. Abdomen: soft, no tender, no mass. Extremities: no edema.  Neurologic Examination:                                                                                                      Mental Status: Alert but disoriented.  Speech fluent without evidence of aphasia.  Able to follow simple step commands without difficulty. Cranial Nerves: II: Discs flat bilaterally; Visual fields grossly normal, pupils equal, round, reactive to light and accommodation III,IV, VI: ptosis not present, extra-ocular motions intact bilaterally V,VII: smile symmetric, facial light touch sensation normal bilaterally VIII: hearing normal  bilaterally IX,X: gag reflex present XI: bilateral shoulder shrug XII: midline tongue extension without atrophy or fasciculations  Motor: Mild right arm weakness. Tone and bulk:normal tone throughout; no atrophy noted Sensory: Pinprick and light touch intact throughout, bilaterally Deep Tendon Reflexes:  Right: Upper Extremity   Left: Upper extremity   biceps (C-5 to C-6) 2/4   biceps (C-5 to C-6) 2/4 tricep (C7) 2/4    triceps (C7) 2/4 Brachioradialis (C6) 2/4  Brachioradialis (C6) 2/4  Lower Extremity Lower Extremity  quadriceps (L-2 to L-4) 2/4   quadriceps (L-2 to L-4) 2/4 Achilles (S1) 2/4   Achilles (S1) 2/4  Plantars: Right: upgoing   Left: downgoing Cerebellar: normal finger-to-nose,  normal heel-to-shin test Gait:  No tested. CV: pulses palpable throughout    Results for orders placed during the hospital encounter of 06/04/13 (from the past 48 hour(s))  CBC     Status: Abnormal   Collection Time    06/04/13  7:31 PM      Result Value Range   WBC 11.1 (*) 4.0 - 10.5 K/uL   RBC 4.20  3.87 - 5.11 MIL/uL   Hemoglobin 13.2  12.0 - 15.0 g/dL   HCT 40.2  36.0 - 46.0 %   MCV 95.7  78.0 - 100.0 fL   MCH 31.4  26.0 - 34.0 pg   MCHC 32.8  30.0 - 36.0 g/dL   RDW 14.2  11.5 - 15.5 %   Platelets 194  150 - 400 K/uL  BASIC METABOLIC PANEL     Status: Abnormal   Collection Time    06/04/13  7:31 PM      Result Value Range   Sodium 138  137 - 147 mEq/L   Potassium 4.2  3.7 - 5.3 mEq/L   Chloride 94 (*) 96 - 112 mEq/L   CO2 30  19 - 32 mEq/L   Glucose, Bld 114 (*) 70 - 99 mg/dL   BUN 24 (*) 6 - 23 mg/dL   Creatinine, Ser 1.24 (*) 0.50 - 1.10 mg/dL   Calcium 8.7  8.4 - 10.5 mg/dL   GFR calc non Af Amer 37 (*) >90 mL/min   GFR calc Af Amer 43 (*) >90 mL/min   Comment: (NOTE)     The eGFR has been calculated using the CKD EPI equation.     This calculation has not been validated in all clinical situations.     eGFR's persistently <90 mL/min signify possible Chronic  Kidney     Disease.  PROTIME-INR     Status: None   Collection Time    06/04/13  7:31 PM      Result Value Range   Prothrombin Time 13.2  11.6 - 15.2 seconds   INR 1.02  0.00 - 1.49  APTT     Status: Abnormal   Collection Time    06/04/13  7:31 PM      Result Value Range   aPTT 23 (*) 24 - 37 seconds  TROPONIN I     Status: None   Collection Time    06/04/13  7:31 PM      Result Value Range   Troponin I <0.30  <0.30 ng/mL   Comment:            Due to the release kinetics of cTnI,     a negative result within the first hours     of the onset of symptoms does not rule out     myocardial infarction with certainty.     If myocardial infarction is still suspected,     repeat the test at appropriate intervals.  GLUCOSE, CAPILLARY     Status: Abnormal   Collection Time    06/04/13  7:53 PM      Result Value Range   Glucose-Capillary 113 (*) 70 - 99 mg/dL  POCT I-STAT TROPONIN I     Status: None   Collection Time    06/04/13  8:25 PM      Result Value Range   Troponin i, poc 0.00  0.00 - 0.08 ng/mL   Comment 3            Comment: Due to the release kinetics of cTnI,     a negative result within the first hours     of the onset of symptoms does not rule out     myocardial infarction with certainty.     If myocardial infarction is still suspected,     repeat the test at appropriate intervals.   Ct Head Wo Contrast  06/04/2013   CLINICAL DATA:  Left-sided weakness. Acute mental status changes. Unable to answer questions. Code stroke.  EXAM: CT HEAD WITHOUT CONTRAST  TECHNIQUE: Contiguous axial images were obtained from the base of the  skull through the vertex without intravenous contrast.  COMPARISON:  None.  FINDINGS: Moderate age-appropriate cortical and deep atrophy. Severe changes of small vessel disease of the white matter diffusely, including the pons. Old lacunar strokes in both basal ganglia and both thalami. No mass lesion. No midline shift. No acute hemorrhage or hematoma.  No extra-axial fluid collections. No evidence of acute infarction.  No focal osseous abnormality involving the skull. Visualized paranasal sinuses, bilateral mastoid air cells, and bilateral middle ear cavities well-aerated. bilateral carotid siphon and left vertebral artery atherosclerosis.  IMPRESSION: 1. No acute intracranial abnormality. 2. Moderate age-appropriate cortical and deep atrophy and severe chronic microvascular ischemic changes of the white matter. These results were called by telephone at the time of interpretation on 06/04/2013 at 7:54 PM to Dr. Aram Beecham of the stroke service, who verbally acknowledged these results.   Electronically Signed   By: Evangeline Dakin M.D.   On: 06/04/2013 19:55     Assessment: 80 y.o. female brought with due to acute onset right hemiparesis and confusion. Initial NIHSS 6 but remarkably improved while in the ED. She was out of the window for IV thrombolysis and thus tpa was not administered. Probable left brain ischemic stroke. Admit to medicine and complete stroke work up. Aspirin pending results stroke work up. Stroke team will resume care in the morning.  Stroke Risk Factors - age, CHF, HTN  Plan: 1. HgbA1c, fasting lipid panel 2. MRI, MRA  of the brain without contrast 3. Echocardiogram 4. Carotid dopplers 5. Prophylactic therapy-aspirin rectally  6. Risk factor modification 7. Telemetry monitoring 8. Frequent neuro checks 9. PT/OT SLP  Dorian Pod, MD [facility] [phone]  06/04/2013, 8:44 PM

## 2013-06-04 NOTE — ED Notes (Signed)
Patient Transported to MRI 

## 2013-06-04 NOTE — ED Notes (Signed)
Patient transported to XR. 

## 2013-06-05 ENCOUNTER — Encounter (HOSPITAL_COMMUNITY): Payer: Self-pay | Admitting: Internal Medicine

## 2013-06-05 DIAGNOSIS — I10 Essential (primary) hypertension: Secondary | ICD-10-CM

## 2013-06-05 DIAGNOSIS — F329 Major depressive disorder, single episode, unspecified: Secondary | ICD-10-CM | POA: Diagnosis present

## 2013-06-05 DIAGNOSIS — N179 Acute kidney failure, unspecified: Secondary | ICD-10-CM | POA: Diagnosis present

## 2013-06-05 DIAGNOSIS — I639 Cerebral infarction, unspecified: Secondary | ICD-10-CM | POA: Diagnosis present

## 2013-06-05 DIAGNOSIS — I517 Cardiomegaly: Secondary | ICD-10-CM

## 2013-06-05 DIAGNOSIS — E785 Hyperlipidemia, unspecified: Secondary | ICD-10-CM

## 2013-06-05 DIAGNOSIS — I959 Hypotension, unspecified: Secondary | ICD-10-CM | POA: Diagnosis present

## 2013-06-05 DIAGNOSIS — F32A Depression, unspecified: Secondary | ICD-10-CM | POA: Diagnosis present

## 2013-06-05 DIAGNOSIS — K219 Gastro-esophageal reflux disease without esophagitis: Secondary | ICD-10-CM

## 2013-06-05 DIAGNOSIS — D72829 Elevated white blood cell count, unspecified: Secondary | ICD-10-CM | POA: Diagnosis present

## 2013-06-05 HISTORY — DX: Hyperlipidemia, unspecified: E78.5

## 2013-06-05 HISTORY — DX: Essential (primary) hypertension: I10

## 2013-06-05 HISTORY — DX: Depression, unspecified: F32.A

## 2013-06-05 HISTORY — DX: Gastro-esophageal reflux disease without esophagitis: K21.9

## 2013-06-05 LAB — LIPID PANEL
CHOL/HDL RATIO: 4.9 ratio
Cholesterol: 245 mg/dL — ABNORMAL HIGH (ref 0–200)
Cholesterol: 227 mg/dL — ABNORMAL HIGH (ref 0–200)
HDL: 50 mg/dL (ref >39)
HDL: 50 mg/dL (ref >39)
LDL CALC: 151 mg/dL — AB (ref 0–99)
LDL Cholesterol: 166 mg/dL — ABNORMAL HIGH (ref 0–99)
Total CHOL/HDL Ratio: 4.5 RATIO
Triglycerides: 146 mg/dL (ref <150)
Triglycerides: 132 mg/dL (ref <150)
VLDL: 26 mg/dL (ref 0–40)
VLDL: 29 mg/dL (ref 0–40)

## 2013-06-05 LAB — CBC
HCT: 39.3 % (ref 36.0–46.0)
Hemoglobin: 12.8 g/dL (ref 12.0–15.0)
MCH: 31 pg (ref 26.0–34.0)
MCHC: 32.6 g/dL (ref 30.0–36.0)
MCV: 95.2 fL (ref 78.0–100.0)
Platelets: 195 10*3/uL (ref 150–400)
RBC: 4.13 MIL/uL (ref 3.87–5.11)
RDW: 14.1 % (ref 11.5–15.5)
WBC: 10.5 10*3/uL (ref 4.0–10.5)

## 2013-06-05 LAB — BASIC METABOLIC PANEL
BUN: 29 mg/dL — ABNORMAL HIGH (ref 6–23)
CALCIUM: 8.5 mg/dL (ref 8.4–10.5)
CO2: 28 mEq/L (ref 19–32)
CREATININE: 1.22 mg/dL — AB (ref 0.50–1.10)
Chloride: 96 mEq/L (ref 96–112)
GFR calc Af Amer: 44 mL/min — ABNORMAL LOW (ref >90)
GFR calc non Af Amer: 38 mL/min — ABNORMAL LOW (ref >90)
Glucose, Bld: 216 mg/dL — ABNORMAL HIGH (ref 70–99)
Potassium: 4.4 mEq/L (ref 3.7–5.3)
Sodium: 140 mEq/L (ref 137–147)

## 2013-06-05 LAB — HEMOGLOBIN A1C
HEMOGLOBIN A1C: 6.1 % — AB (ref <5.7)
Hgb A1c MFr Bld: 6 % — ABNORMAL HIGH (ref <5.7)
Mean Plasma Glucose: 126 mg/dL — ABNORMAL HIGH (ref <117)
Mean Plasma Glucose: 128 mg/dL — ABNORMAL HIGH (ref <117)

## 2013-06-05 LAB — TSH: TSH: 0.685 u[IU]/mL (ref 0.350–4.500)

## 2013-06-05 MED ORDER — PANTOPRAZOLE SODIUM 40 MG PO TBEC: 40.0000 mg | DELAYED_RELEASE_TABLET | Freq: Every day | ORAL | Status: DC

## 2013-06-05 MED ORDER — ATORVASTATIN CALCIUM 20 MG PO TABS
20.0000 mg | ORAL_TABLET | Freq: Every day | ORAL | Status: DC
  Administered 2013-06-05 – 2013-06-08 (×4): 20 mg via ORAL
  Filled 2013-06-05 (×5): qty 1

## 2013-06-05 MED ORDER — PANTOPRAZOLE SODIUM 40 MG PO TBEC
40.0000 mg | DELAYED_RELEASE_TABLET | Freq: Every day | ORAL | Status: DC
Start: 2013-06-05 — End: 2013-06-09
  Administered 2013-06-05 – 2013-06-09 (×4): 40 mg via ORAL
  Filled 2013-06-05 (×4): qty 1

## 2013-06-05 MED ORDER — SODIUM CHLORIDE 0.9 % IV BOLUS (SEPSIS)
500.0000 mL | Freq: Once | INTRAVENOUS | Status: AC
  Administered 2013-06-05: 500 mL via INTRAVENOUS

## 2013-06-05 MED ORDER — ENOXAPARIN SODIUM 40 MG/0.4ML ~~LOC~~ SOLN
40.0000 mg | SUBCUTANEOUS | Status: DC
  Administered 2013-06-05 – 2013-06-09 (×5): 40 mg via SUBCUTANEOUS
  Filled 2013-06-05 (×5): qty 0.4

## 2013-06-05 MED ORDER — MOMETASONE FURO-FORMOTEROL FUM 100-5 MCG/ACT IN AERO
2.0000 | INHALATION_SPRAY | Freq: Two times a day (BID) | RESPIRATORY_TRACT | Status: DC
  Administered 2013-06-05 – 2013-06-09 (×7): 2 via RESPIRATORY_TRACT
  Filled 2013-06-05 (×2): qty 8.8

## 2013-06-05 MED ORDER — ASPIRIN 325 MG PO TABS
325.0000 mg | ORAL_TABLET | Freq: Every day | ORAL | Status: DC
Start: 2013-06-05 — End: 2013-06-06
  Administered 2013-06-05 – 2013-06-06 (×2): 325 mg via ORAL
  Filled 2013-06-05 (×2): qty 1

## 2013-06-05 MED ORDER — CITALOPRAM HYDROBROMIDE 20 MG PO TABS
20.0000 mg | ORAL_TABLET | Freq: Every day | ORAL | Status: DC
  Administered 2013-06-05 – 2013-06-06 (×2): 20 mg via ORAL
  Filled 2013-06-05 (×4): qty 1

## 2013-06-05 MED ORDER — SODIUM CHLORIDE 0.9 % IV SOLN
INTRAVENOUS | Status: DC
  Administered 2013-06-05: 23:00:00 via INTRAVENOUS
  Administered 2013-06-06: 950 mL via INTRAVENOUS
  Administered 2013-06-07: 02:00:00 via INTRAVENOUS

## 2013-06-05 MED ORDER — ACETAMINOPHEN 325 MG PO TABS: 650.0000 mg | ORAL_TABLET | ORAL | Status: DC | PRN

## 2013-06-05 MED ORDER — SODIUM CHLORIDE 0.9 % IV BOLUS (SEPSIS)
1000.0000 mL | Freq: Once | INTRAVENOUS | Status: AC
  Administered 2013-06-05: 1000 mL via INTRAVENOUS

## 2013-06-05 MED ORDER — LORATADINE 10 MG PO TABS
10.0000 mg | ORAL_TABLET | Freq: Every day | ORAL | Status: DC
  Administered 2013-06-05 – 2013-06-09 (×5): 10 mg via ORAL
  Filled 2013-06-05 (×6): qty 1

## 2013-06-05 MED ORDER — SODIUM CHLORIDE 0.9 % IV SOLN
INTRAVENOUS | Status: AC
  Administered 2013-06-05 (×2): via INTRAVENOUS

## 2013-06-05 NOTE — Evaluation (Addendum)
Physical Therapy Evaluation Patient Details Name: Daisy [name] MRN: [id number] DOB: 13-May-1922 Today's Date: 06/05/2013 Time: [id number] PT Time Calculation (min): 28 min  PT Assessment / Plan / Recommendation History of Present Illness  Patient is an 80 yo female s/p multiple infarct watershed ischemic CVA.  Clinical Impression  Patient demonstrates deficits in functional mobility as indicated below. Pt will benefit from continued skilled PT to address deficits and maximize independence. Will continue to see as indicated. Recommend CIR upon discharge.    PT Assessment  Patient needs continued PT services    Follow Up Recommendations  CIR          Equipment Recommendations  None recommended by PT       Frequency Min 2X/week    Precautions / Restrictions Precautions Precautions: Fall Restrictions Weight Bearing Restrictions: No   Pertinent Vitals/Pain No pain at this time      Mobility  Bed Mobility Overal bed mobility: Needs Assistance Bed Mobility: Supine to Sit Supine to sit: Mod assist;+2 for physical assistance General bed mobility comments: Patient unable to initiate bed mobility or problem solve despite max cues for sequencing Transfers Overall transfer level: Needs assistance Equipment used: 2 person hand held assist Transfers: Sit to/from Stand Sit to Stand: Min assist;+2 physical assistance General transfer comment: Bilateral support for stability Ambulation/Gait Ambulation/Gait assistance: Max assist (At times required +2 assist for LOB forward) Ambulation Distance (Feet): 60 Feet Assistive device: 1 person hand held assist Gait Pattern/deviations: Decreased stride length;Shuffle;Drifts right/left;Trunk flexed;Narrow base of support (anterior) Gait velocity: decreased Gait velocity interpretation: <1.8 ft/sec, indicative of risk for recurrent falls General Gait Details: high fall risk, poor stability, increased fwd flexion Modified Rankin (Stroke  Patients Only) Pre-Morbid Rankin Score: No significant disability Modified Rankin: Moderately severe disability    Exercises     PT Diagnosis: Difficulty walking;Abnormality of gait  PT Problem List: Decreased strength;Decreased activity tolerance;Decreased balance;Decreased mobility;Decreased coordination;Decreased cognition;Obesity PT Treatment Interventions: DME instruction;Gait training;Stair training;Functional mobility training;Therapeutic activities;Therapeutic exercise;Balance training;Patient/family education     PT Goals(Current goals can be found in the care plan section) Acute Rehab PT Goals Patient Stated Goal: per family, to get back to independent level PT Goal Formulation: With patient/family Time For Goal Achievement: 06/19/13 Potential to Achieve Goals: Good  Visit Information  Last PT Received On: 06/05/13 Assistance Needed: +2 History of Present Illness: Patient is an 80 yo female s/p multiple infarct watershed ischemic CVA.       Prior Functioning  Home Living Family/patient expects to be discharged to:: Private residence Living Arrangements: Children Available Help at Discharge: Family Type of Home: House Home Access: Stairs to enter Secretary/administratorntrance Stairs-Number of Steps: 4 Entrance Stairs-Rails: Right;Left Home Layout: One level Home Equipment: Environmental consultantWalker - 4 wheels Prior Function Level of Independence: Independent with assistive device(s) Communication Communication: No difficulties Dominant Hand: Right    Cognition  Cognition Arousal/Alertness: Awake/alert Behavior During Therapy: WFL for tasks assessed/performed Overall Cognitive Status: Impaired/Different from baseline Area of Impairment: Orientation;Following commands;Safety/judgement;Problem solving Orientation Level: Disoriented to;Time Following Commands: Follows one step commands inconsistently;Follows one step commands with increased time Safety/Judgement: Decreased awareness of  deficits Problem Solving: Slow processing;Decreased initiation;Difficulty sequencing;Requires verbal cues;Requires tactile cues    Extremity/Trunk Assessment Upper Extremity Assessment Upper Extremity Assessment: Defer to OT evaluation (noted Drift) Lower Extremity Assessment Lower Extremity Assessment: RLE deficits/detail RLE Deficits / Details: asymetrical weakness 3+/5 RLE RLE Coordination: decreased fine motor;decreased gross motor   Balance Balance Overall balance assessment: Needs assistance Sitting-balance support: Feet supported Sitting  balance-[name] Scale: Good Standing balance support: During functional activity Standing balance-[name] Scale: Fair  End of Session PT - End of Session Equipment Utilized During Treatment: Gait belt Activity Tolerance: Patient tolerated treatment well Patient left: in chair;with call bell/phone within reach;with family/visitor present Nurse Communication: Mobility status  GP     Fabio Asa 06/05/2013, 4:17 PM Charlotte Crumb, PT DPT  [phone]

## 2013-06-05 NOTE — Progress Notes (Signed)
Stroke Team Progress Note  HISTORY Daisy [name] is an 80 y.o. female with a past medical history significant for CHF, HTN, COPD, brought in by EMS as a code stroke due to acute onset of right hemiparesis and confusion. Last known well by family at 3 pm today 06/04/2013, when his family was giving her a bath and noted that she was weak in the right side and seemed to be confused. Upon arrival to ED she was alert but confused and had initial NIHSS 6, with subsequent improvement while still in the ED. CT brain revealed no acute abnormality. At this time she denies HA, vertigo, double vision, difficulty swallowing, visual disturbances, chest pain, or palpitations. Patient was not a TPA candidate secondary to delay in arrival. She was admitted for further evaluation and treatment.  SUBJECTIVE Her  family is not at the bedside.  Overall she feels her condition is rapidly improving.    OBJECTIVE Most recent Vital Signs: Filed Vitals:   06/05/13 0030 06/05/13 0230 06/05/13 0421 06/05/13 1150  BP: 152/67 126/63 91/52 105/65  Pulse: 87 64 71 57  Temp: 97.9 F (36.6 C) 97.8 F (36.6 C) 97.9 F (36.6 C) 98 F (36.7 C)  TempSrc:   Oral Oral  Resp: 18 18  18   Height: 5\' 1"  (1.549 m)     Weight: 86 kg (189 lb 9.5 oz)     SpO2: 94% 93% 93% 94%   CBG (last 3)   Recent Labs  06/04/13 1953  GLUCAP 113*    IV Fluid Intake:     MEDICATIONS  . aspirin  300 mg Rectal Daily  . aspirin  325 mg Oral Daily  . atorvastatin  20 mg Oral q1800  . citalopram  20 mg Oral Daily  . enoxaparin (LOVENOX) injection  40 mg Subcutaneous Q24H  . loratadine  10 mg Oral Daily  . mometasone-formoterol  2 puff Inhalation BID   PRN:  acetaminophen  Diet:  Cardiac thin liquids Activity:   Bathroom privileges with assistance DVT Prophylaxis:  Lovenox 40 mg sq daily   CLINICALLY SIGNIFICANT STUDIES Basic Metabolic Panel:  Recent Labs Lab 06/04/13 1931 06/05/13 0350  NA 138 140  K 4.2 4.4  CL 94* 96  CO2 30  28  GLUCOSE 114* 216*  BUN 24* 29*  CREATININE 1.24* 1.22*  CALCIUM 8.7 8.5   Liver Function Tests: No results found for this basename: AST, ALT, ALKPHOS, BILITOT, PROT, ALBUMIN,  in the last 168 hours CBC:  Recent Labs Lab 06/04/13 1931 06/05/13 0350  WBC 11.1* 10.5  HGB 13.2 12.8  HCT 40.2 39.3  MCV 95.7 95.2  PLT 194 195   Coagulation:  Recent Labs Lab 06/04/13 1931  LABPROT 13.2  INR 1.02   Cardiac Enzymes:  Recent Labs Lab 06/04/13 1931  TROPONINI <0.30   Urinalysis:  Recent Labs Lab 06/04/13 2110  COLORURINE YELLOW  LABSPEC 1.012  PHURINE 6.5  GLUCOSEU NEGATIVE  HGBUR NEGATIVE  BILIRUBINUR NEGATIVE  KETONESUR NEGATIVE  PROTEINUR NEGATIVE  UROBILINOGEN 0.2  NITRITE NEGATIVE  LEUKOCYTESUR NEGATIVE   Lipid Panel    Component Value Date/Time   CHOL 227* 06/05/2013 0400   TRIG 132 06/05/2013 0400   HDL 50 06/05/2013 0400   CHOLHDL 4.5 06/05/2013 0400   VLDL 26 06/05/2013 0400   LDLCALC 151* 06/05/2013 0400   HgbA1C  No results found for this basename: HGBA1C    Urine Drug Screen:   No results found for this basename: labopia, cocainscrnur, labbenz, amphetmu,  thcu, labbarb    Alcohol Level: No results found for this basename: ETH,  in the last 168 hours   CT of the brain  06/04/2013    1. No acute intracranial abnormality. 2. Moderate age-appropriate cortical and deep atrophy and severe chronic microvascular ischemic changes of the white matter. These results were called by telephone at the time of interpretation on 06/04/2013 at 7:54 PM to Dr. Cyril Mourningamillo of the stroke service, who verbally acknowledged these results.    MRI of the brain  06/05/2013     1. Multi focal ischemic infarcts involving the left frontal, parietal, and occipital lobes as above. These foci are predominantly in a watershed distribution, and may be related to a hypotensive episode. 2. Advanced age related atrophy with chronic microvascular ischemic disease. 3. Scattered subcentimeter hypo  intense foci on gradient echo sequence involving both cerebral hemispheres, most consistent with small chronic micro hemorrhages. Findings may be related to underlying amyloid angiopathy versus chronic hypertensive micro hemorrhages. 4. Small remote lacunar infarcts within the right thalamus and left basal ganglia.    MRA of the brain  06/05/2013     1. No proximal branch occlusion or high-grade flow-limiting stenosis identified. Specifically, no high-grade stenosis identified within the visualized left internal carotid artery or left middle cerebral artery that may contribute to the left cerebral watershed territory infarcts. 2. Short segment mild stenosis of approximately 40% within the proximal right M1 segment. 3. No intracranial aneurysm.  2D Echocardiogram    Carotid Doppler  Technically limited due to body habitus. Bilateral: 1-39% ICA stenosis. Vertebral artery flow is antegrade on the right. Left vertebral was not insonated.   CXR  06/04/2013    No acute disease.  Linear atelectasis in the lingula is noted.    EKG  Sinus or atopic atrial rhythm.   Therapy Recommendations   Physical Exam   Pleasant elderly lady not in distress.Awake alert. Afebrile. Head is nontraumatic. Neck is supple without bruit. Hearing is  diminished. Cardiac exam no murmur or gallop. Lungs are clear to auscultation. Distal pulses are well felt. Neurological Exam : Awake alert oriented x 2. Diminished attention and recall. Follows two-step commands. Slightly hesitant speech   and language. Mild right lower face asymmetry. Tongue midline. No drift. Mild diminished fine finger movements on the right Orbits left over right upper extremity. Mild right grip weak.. Normal sensation . Normal coordination. ASSESSMENT Daisy [name] is a 80 y.o. female presenting with right hemiparesis and confusion. Imaging confirms a left frontal, parietal and occipital infarcts in a watershed distribution. Infarct felt to be   embolic  secondary to  Unknown source.  On aspirin 325 mg orally every day prior to admission. Now on aspirin 325 mg orally every day for secondary stroke prevention. Patient with resultant  Mild aphasia and weakness Work up underway.  hypertension COPD CHF Acute renal failure, likely pre-renal, placed on IVF Leukocytosis, no signs of infection Hyperlipidemia, LDL 151, on crestor 5 daily PTA, now on lipitor 20 mg daily, goal LDL < 100 (< 70 for diabetics)   Hospital day # 1  TREATMENT/PLAN  Continue aspirin 325 mg orally every day for secondary stroke prevention.  F/u 2D echo  OOB, therapy evals. May need rehabilitation  I have personally obtained a history, examined the patient, evaluated imaging results, and formulated the assessment and plan of care. I agree with the above. Delia HeadyPramod Kerin Cecchi, MD

## 2013-06-05 NOTE — Progress Notes (Addendum)
*  PRELIMINARY RESULTS* Vascular Ultrasound Carotid Duplex (Doppler) has been completed.  Preliminary findings: Technically limited due to body habitus. Bilateral:  1-39% ICA stenosis.  Vertebral artery flow is antegrade on the right. Left vertebral was not insonated.       Farrel DemarkJill Eunice, RDMS, RVT  06/05/2013, 12:55 PM

## 2013-06-05 NOTE — Progress Notes (Signed)
Echo Lab  2D Echocardiogram completed.  Verle Wheeling L Ghazi Rumpf, RDCS 06/05/2013 1:03 PM

## 2013-06-05 NOTE — Progress Notes (Signed)
[facility] PROGRESS NOTE  Jaydence Vanyo ZOX:[id number] DOB: 13-Feb-1922 DOA: 06/04/2013 PCP: No primary provider on file.  Assessment/Plan: #1 acute CVA MRI of the head with multifocal ischemic infarcts involving left frontal, parietal and occipital lobes. Patient also noted to be hypotensive. Carotid Doppler is pending. 2-D echo pending.continue aspirin for secondary stroke prevention. Patient noted to have elevated LDL and will be started on a statin. Patient may require TEE.we'll place on IV fluids hold blood pressure medications and follow. Neurology following and appreciate input and recommendations.  #2 hypotension Patient noted to be on diuretics and antihypertensive medications prior to admission. Patient is currently afebrile. Urinalysis was negative. Chest x-ray also noted to be negative. Will hold antihypertensive medications. Hydrate with IV fluids. Follow.  #3 acute renal failure Likely secondary to prerenal azotemia in the setting of ACE inhibitor and diuretics. ACE inhibitor and diuretics on hold. Continue IV fluids. Will follow.  #4 hyperlipidemia LDL of 151. Patient was on 5 mg of Crestor prior to admission and on discharge will likely need to be increased to 10 mg of Crestor. Patient has been started on Lipitor while in house. Follow.  #5 leukocytosis Likely a reactive leukocytosis. Chest x-ray is negative. Urinalysis is negative. No need for antibiotics at this time. Follow.  #6 depression Resume home regimen of Celexa.  #7 hypertension Antihypertensive medications on hold secondary to problems #2 and 3.  #8 gastroesophageal reflux disease PPI.  #9 prophylaxis PPI for GI prophylaxis. Lovenox for DVT prophylaxis.  Code Status: full Family Communication: updated patient and daughters at bedside. Disposition Plan: home versus skilled nursing facility when medically stable.   Consultants:  Neurology: Dr. Cyril Mourning 06/04/2013  Procedures:  CT head  06/04/2013  MRI/MRA of the head 06/04/2013  Antibiotics:  none  HPI/Subjective: Patient with some improvement. Right-sided weakness improving. Slurred speech improved.  Objective: Filed Vitals:   06/05/13 1859  BP: 88/49  Pulse: 72  Temp: 98 F (36.7 C)  Resp: 18   No intake or output data in the 24 hours ending 06/05/13 2146 Filed Weights   06/05/13 0030  Weight: 86 kg (189 lb 9.5 oz)    Exam:   General:  Alert. NAD.  Cardiovascular: regular rate rhythm no murmurs rubs or gallops.  Respiratory: clear to auscultation bilaterally in the anterior lung fields.  Abdomen: soft, nontender, nondistended, positive bowel sounds  Musculoskeletal: no clubbing cyanosis or edema  Data Reviewed: Basic Metabolic Panel:  Recent Labs Lab 06/04/13 1931 06/05/13 0350  NA 138 140  K 4.2 4.4  CL 94* 96  CO2 30 28  GLUCOSE 114* 216*  BUN 24* 29*  CREATININE 1.24* 1.22*  CALCIUM 8.7 8.5   Liver Function Tests: No results found for this basename: AST, ALT, ALKPHOS, BILITOT, PROT, ALBUMIN,  in the last 168 hours No results found for this basename: LIPASE, AMYLASE,  in the last 168 hours No results found for this basename: AMMONIA,  in the last 168 hours CBC:  Recent Labs Lab 06/04/13 1931 06/05/13 0350  WBC 11.1* 10.5  HGB 13.2 12.8  HCT 40.2 39.3  MCV 95.7 95.2  PLT 194 195   Cardiac Enzymes:  Recent Labs Lab 06/04/13 1931  TROPONINI <0.30   BNP (last 3 results) No results found for this basename: PROBNP,  in the last 8760 hours CBG:  Recent Labs Lab 06/04/13 1953  GLUCAP 113*    No results found for this or any previous visit (from the past 240 hour(s)).   Studies:  Dg Chest 2 View  06/04/2013   CLINICAL DATA:  Shortness of breath for 1 day.  EXAM: CHEST  2 VIEW  COMPARISON:  PA and lateral chest 05/20/2013.  FINDINGS: There is linear atelectasis in the lingula. The lungs are otherwise clear. Heart size is mildly enlarged. No pneumothorax or pleural  effusion.  IMPRESSION: No acute disease.  Linear atelectasis in the lingula is noted.   Electronically Signed   By: Drusilla Kanner M.D.   On: 06/04/2013 21:51   Ct Head Wo Contrast  06/04/2013   CLINICAL DATA:  Left-sided weakness. Acute mental status changes. Unable to answer questions. Code stroke.  EXAM: CT HEAD WITHOUT CONTRAST  TECHNIQUE: Contiguous axial images were obtained from the base of the skull through the vertex without intravenous contrast.  COMPARISON:  None.  FINDINGS: Moderate age-appropriate cortical and deep atrophy. Severe changes of small vessel disease of the white matter diffusely, including the pons. Old lacunar strokes in both basal ganglia and both thalami. No mass lesion. No midline shift. No acute hemorrhage or hematoma. No extra-axial fluid collections. No evidence of acute infarction.  No focal osseous abnormality involving the skull. Visualized paranasal sinuses, bilateral mastoid air cells, and bilateral middle ear cavities well-aerated. bilateral carotid siphon and left vertebral artery atherosclerosis.  IMPRESSION: 1. No acute intracranial abnormality. 2. Moderate age-appropriate cortical and deep atrophy and severe chronic microvascular ischemic changes of the white matter. These results were called by telephone at the time of interpretation on 06/04/2013 at 7:54 PM to Dr. Cyril Mourning of the stroke service, who verbally acknowledged these results.   Electronically Signed   By: Hulan Saas M.D.   On: 06/04/2013 19:55   Mr Maxine Glenn Head Wo Contrast  06/05/2013   CLINICAL DATA:  Right-sided facial droop, now resolved  EXAM: MRI HEAD WITHOUT CONTRAST  MRA HEAD WITHOUT CONTRAST  TECHNIQUE: Multiplanar, multiecho pulse sequences of the brain and surrounding structures were obtained without intravenous contrast. Angiographic images of the head were obtained using MRA technique without contrast.  COMPARISON:  Prior CT performed earlier on the same day and  FINDINGS: MRI HEAD FINDINGS   Advanced age-related atrophy is present. Extensive scattered and confluent T2/FLAIR hyperintensity within the periventricular and deep white matter is compatible with moderate chronic microvascular ischemic changes.  Cervicomedullary junction is within normal limits. Pituitary gland is normal. Asymmetric FLAIR hyperintensity involving the intraconal and extraconal fat of the left orbit is thought to be artifactual in nature, as similar changes are seen within the adjacent left face.  No mass lesion or midline shift. There is mild ventricular prominence related to generalized cerebral atrophy without evidence of hydrocephalus. No extra-axial fluid collection.  Multiple foci of restricted diffusion are seen involving the cortical gray matter and subcortical white matter of the right frontal, parietal, and occipital lobes (series 5, and image 15 through 25). These foci are predominantly along a watershed distribution involving the left centrum semi ovale. No significant mass effect at this time. No associated hemorrhage seen on gradient echo sequence. No right-sided cerebral infarcts identified. No infratentorial infarct.  A few scattered subcentimeter and T1 hypointense, T2 hyperintense lacunar infarcts are noted within the right thalamus and left basal ganglia.  A few scattered foci of hypo intense signal intensity seen on gradient echo sequence within the right frontal and parietal lobes likely represent chronic micro hemorrhages. The largest of these is located within the right frontal lobe and measures 4 mm (series 10, image 99). Similar foci seen within the  right thalamus. Several scattered foci are also noted within the left cerebral hemisphere.  Calvarium demonstrates a normal appearance with normal signal intensity. Visualized upper cervical spine is grossly normal.  Scalp soft tissues within normal limits.  Paranasal sinuses are clear.  Small right mastoid effusion noted.  MRA HEAD FINDINGS  The visualized  portions of the distal cervical internal carotid arteries are within normal limits with widely patent antegrade flow. No high-grade stenosis seen. The cervical segments of the distal internal carotid arteries are tortuous. The petrous, cavernous, and supra clinoid segments of the internal carotid arteries are widely patent without high-grade stenosis or occlusion. The A1 segments are symmetric in caliber with widely patent antegrade flow. Anterior communicating artery and anterior cerebral arteries are within normal limits.  The left M1 segment is widely patent with antegrade flow. No proximal branch occlusion or high-grade flow-limiting stenosis identified. The distal left MCA territory branches are within normal limits.  Mild multi focal irregularity noted within the distal aspect of the right M1 segment without high-grade focal stenosis. A short-segment stenosis of approximately 40% is seen within the proximal right M1 segment (series 703, image 7). The distal right MCA territory branches are normal.  Vertebral arteries are codominant. The posterior inferior cerebellar arteries are widely patent with antegrade flow. Vertebrobasilar junction and basilar artery are within normal limits. No basilar tip stenosis or aneurysm. Posterior cerebellar arteries are widely patent without high-grade stenosis. Question fetal origin of the right PCA. The superior cerebellar arteries and anterior inferior cerebellar arteries are within normal limits.  No intracranial aneurysm identified.  IMPRESSION: MR HEAD:  1. Multi focal ischemic infarcts involving the left frontal, parietal, and occipital lobes as above. These foci are predominantly in a watershed distribution, and may be related to a hypotensive episode. 2. Advanced age related atrophy with chronic microvascular ischemic disease. 3. Scattered subcentimeter hypo intense foci on gradient echo sequence involving both cerebral hemispheres, most consistent with small chronic  micro hemorrhages. Findings may be related to underlying amyloid angiopathy versus chronic hypertensive micro hemorrhages. 4. Small remote lacunar infarcts within the right thalamus and left basal ganglia.  MRA HEAD:  1. No proximal branch occlusion or high-grade flow-limiting stenosis identified. Specifically, no high-grade stenosis identified within the visualized left internal carotid artery or left middle cerebral artery that may contribute to the left cerebral watershed territory infarcts. 2. Short segment mild stenosis of approximately 40% within the proximal right M1 segment. 3. No intracranial aneurysm.   Electronically Signed   By: Rise MuBenjamin  McClintock M.D.   On: 06/05/2013 00:43   Mr Brain Wo Contrast  06/05/2013   CLINICAL DATA:  Right-sided facial droop, now resolved  EXAM: MRI HEAD WITHOUT CONTRAST  MRA HEAD WITHOUT CONTRAST  TECHNIQUE: Multiplanar, multiecho pulse sequences of the brain and surrounding structures were obtained without intravenous contrast. Angiographic images of the head were obtained using MRA technique without contrast.  COMPARISON:  Prior CT performed earlier on the same day and  FINDINGS: MRI HEAD FINDINGS  Advanced age-related atrophy is present. Extensive scattered and confluent T2/FLAIR hyperintensity within the periventricular and deep white matter is compatible with moderate chronic microvascular ischemic changes.  Cervicomedullary junction is within normal limits. Pituitary gland is normal. Asymmetric FLAIR hyperintensity involving the intraconal and extraconal fat of the left orbit is thought to be artifactual in nature, as similar changes are seen within the adjacent left face.  No mass lesion or midline shift. There is mild ventricular prominence related to generalized cerebral atrophy  without evidence of hydrocephalus. No extra-axial fluid collection.  Multiple foci of restricted diffusion are seen involving the cortical gray matter and subcortical white matter of the  right frontal, parietal, and occipital lobes (series 5, and image 15 through 25). These foci are predominantly along a watershed distribution involving the left centrum semi ovale. No significant mass effect at this time. No associated hemorrhage seen on gradient echo sequence. No right-sided cerebral infarcts identified. No infratentorial infarct.  A few scattered subcentimeter and T1 hypointense, T2 hyperintense lacunar infarcts are noted within the right thalamus and left basal ganglia.  A few scattered foci of hypo intense signal intensity seen on gradient echo sequence within the right frontal and parietal lobes likely represent chronic micro hemorrhages. The largest of these is located within the right frontal lobe and measures 4 mm (series 10, image 99). Similar foci seen within the right thalamus. Several scattered foci are also noted within the left cerebral hemisphere.  Calvarium demonstrates a normal appearance with normal signal intensity. Visualized upper cervical spine is grossly normal.  Scalp soft tissues within normal limits.  Paranasal sinuses are clear.  Small right mastoid effusion noted.  MRA HEAD FINDINGS  The visualized portions of the distal cervical internal carotid arteries are within normal limits with widely patent antegrade flow. No high-grade stenosis seen. The cervical segments of the distal internal carotid arteries are tortuous. The petrous, cavernous, and supra clinoid segments of the internal carotid arteries are widely patent without high-grade stenosis or occlusion. The A1 segments are symmetric in caliber with widely patent antegrade flow. Anterior communicating artery and anterior cerebral arteries are within normal limits.  The left M1 segment is widely patent with antegrade flow. No proximal branch occlusion or high-grade flow-limiting stenosis identified. The distal left MCA territory branches are within normal limits.  Mild multi focal irregularity noted within the distal  aspect of the right M1 segment without high-grade focal stenosis. A short-segment stenosis of approximately 40% is seen within the proximal right M1 segment (series 703, image 7). The distal right MCA territory branches are normal.  Vertebral arteries are codominant. The posterior inferior cerebellar arteries are widely patent with antegrade flow. Vertebrobasilar junction and basilar artery are within normal limits. No basilar tip stenosis or aneurysm. Posterior cerebellar arteries are widely patent without high-grade stenosis. Question fetal origin of the right PCA. The superior cerebellar arteries and anterior inferior cerebellar arteries are within normal limits.  No intracranial aneurysm identified.  IMPRESSION: MR HEAD:  1. Multi focal ischemic infarcts involving the left frontal, parietal, and occipital lobes as above. These foci are predominantly in a watershed distribution, and may be related to a hypotensive episode. 2. Advanced age related atrophy with chronic microvascular ischemic disease. 3. Scattered subcentimeter hypo intense foci on gradient echo sequence involving both cerebral hemispheres, most consistent with small chronic micro hemorrhages. Findings may be related to underlying amyloid angiopathy versus chronic hypertensive micro hemorrhages. 4. Small remote lacunar infarcts within the right thalamus and left basal ganglia.  MRA HEAD:  1. No proximal branch occlusion or high-grade flow-limiting stenosis identified. Specifically, no high-grade stenosis identified within the visualized left internal carotid artery or left middle cerebral artery that may contribute to the left cerebral watershed territory infarcts. 2. Short segment mild stenosis of approximately 40% within the proximal right M1 segment. 3. No intracranial aneurysm.   Electronically Signed   By: Rise Mu M.D.   On: 06/05/2013 00:43    Scheduled Meds: . aspirin  300 mg  Rectal Daily  . aspirin  325 mg Oral Daily  .  atorvastatin  20 mg Oral q1800  . citalopram  20 mg Oral Daily  . enoxaparin (LOVENOX) injection  40 mg Subcutaneous Q24H  . loratadine  10 mg Oral Daily  . mometasone-formoterol  2 puff Inhalation BID  . sodium chloride  1,000 mL Intravenous Once   Continuous Infusions: . sodium chloride      Principal Problem:   CVA (cerebral infarction) Active Problems:   Hypotension, unspecified   Other and unspecified hyperlipidemia   Acute renal failure   Leukocytosis, unspecified   Essential hypertension, benign   Depression   GERD (gastroesophageal reflux disease)    Time spent: 35 minutes    Oneill Bais M.D. [facility] Pager [phone]. If 7PM-7AM, please contact night-coverage at [email or web address], password [facility] [place] 06/05/2013, 9:46 PM  LOS: 1 day

## 2013-06-06 DIAGNOSIS — I6350 Cerebral infarction due to unspecified occlusion or stenosis of unspecified cerebral artery: Secondary | ICD-10-CM

## 2013-06-06 LAB — BASIC METABOLIC PANEL
BUN: 26 mg/dL — AB (ref 6–23)
CHLORIDE: 103 meq/L (ref 96–112)
CO2: 30 mEq/L (ref 19–32)
Calcium: 7.9 mg/dL — ABNORMAL LOW (ref 8.4–10.5)
Creatinine, Ser: 0.99 mg/dL (ref 0.50–1.10)
GFR calc non Af Amer: 49 mL/min — ABNORMAL LOW (ref >90)
GFR, EST AFRICAN AMERICAN: 57 mL/min — AB (ref >90)
GLUCOSE: 86 mg/dL (ref 70–99)
POTASSIUM: 4.2 meq/L (ref 3.7–5.3)
Sodium: 141 mEq/L (ref 137–147)

## 2013-06-06 LAB — RAPID URINE DRUG SCREEN, HOSP PERFORMED
AMPHETAMINES: NOT DETECTED
BENZODIAZEPINES: NOT DETECTED
Barbiturates: NOT DETECTED
Cocaine: NOT DETECTED
Opiates: NOT DETECTED
Tetrahydrocannabinol: NOT DETECTED

## 2013-06-06 MED ORDER — CLOPIDOGREL BISULFATE 75 MG PO TABS
75.0000 mg | ORAL_TABLET | Freq: Every day | ORAL | Status: DC
  Administered 2013-06-07 – 2013-06-08 (×2): 75 mg via ORAL
  Filled 2013-06-06 (×2): qty 1

## 2013-06-06 NOTE — Progress Notes (Addendum)
Stroke Team Progress Note  HISTORY Daisy [name] is an 80 y.o. female with a past medical history significant for CHF, HTN, COPD, brought in by EMS, 06/04/2013, as a code stroke due to acute onset of right hemiparesis and confusion. Last known well by family at 3 pm 06/04/2013, when her family was giving her a bath and noted that she was weak in the right side and seemed to be confused. Upon arrival to ED she was alert but confused and had initial NIHSS 6, with subsequent improvement while still in the ED. CT brain revealed no acute abnormality. At that time she denied HA, vertigo, double vision, difficulty swallowing, visual disturbances, chest pain, or palpitations. Patient was not a TPA candidate secondary to delay in arrival. She was admitted for further evaluation and treatment.  SUBJECTIVE  The patient's daughter and 2 granddaughters are to bedside. They had multiple questions regarding the patient's condition. They are hoping that the patient will be able to return home with family assistance. The patient is without complaints although she is anxious to go home.the family feels the patient is improved and is essentially back to baseline.  OBJECTIVE Most recent Vital Signs: Filed Vitals:   06/05/13 2132 06/06/13 0130 06/06/13 0530 06/06/13 1016  BP:  155/69 150/69 144/62  Pulse:  64 65 68  Temp:  97.6 F (36.4 C) 97.8 F (36.6 C) 98.2 F (36.8 C)  TempSrc:  Oral Oral Oral  Resp:  20 18 20   Height:      Weight:      SpO2: 95% 95% 94% 94%   CBG (last 3)   Recent Labs  06/04/13 1953  GLUCAP 113*    IV Fluid Intake:   . sodium chloride 125 mL/hr at 06/05/13 2251    MEDICATIONS  . aspirin  300 mg Rectal Daily  . aspirin  325 mg Oral Daily  . atorvastatin  20 mg Oral q1800  . citalopram  20 mg Oral Daily  . enoxaparin (LOVENOX) injection  40 mg Subcutaneous Q24H  . loratadine  10 mg Oral Daily  . mometasone-formoterol  2 puff Inhalation BID  . pantoprazole  40 mg Oral Q0600    PRN:  acetaminophen  Diet:  Cardiac thin liquids Activity:   Bathroom privileges with assistance DVT Prophylaxis:  Lovenox 40 mg sq daily   CLINICALLY SIGNIFICANT STUDIES Basic Metabolic Panel:   Recent Labs Lab 06/04/13 1931 06/05/13 0350  NA 138 140  K 4.2 4.4  CL 94* 96  CO2 30 28  GLUCOSE 114* 216*  BUN 24* 29*  CREATININE 1.24* 1.22*  CALCIUM 8.7 8.5   Liver Function Tests: No results found for this basename: AST, ALT, ALKPHOS, BILITOT, PROT, ALBUMIN,  in the last 168 hours CBC:   Recent Labs Lab 06/04/13 1931 06/05/13 0350  WBC 11.1* 10.5  HGB 13.2 12.8  HCT 40.2 39.3  MCV 95.7 95.2  PLT 194 195   Coagulation:   Recent Labs Lab 06/04/13 1931  LABPROT 13.2  INR 1.02   Cardiac Enzymes:   Recent Labs Lab 06/04/13 1931  TROPONINI <0.30   Urinalysis:   Recent Labs Lab 06/04/13 2110  COLORURINE YELLOW  LABSPEC 1.012  PHURINE 6.5  GLUCOSEU NEGATIVE  HGBUR NEGATIVE  BILIRUBINUR NEGATIVE  KETONESUR NEGATIVE  PROTEINUR NEGATIVE  UROBILINOGEN 0.2  NITRITE NEGATIVE  LEUKOCYTESUR NEGATIVE   Lipid Panel    Component Value Date/Time   CHOL 227* 06/05/2013 0400   TRIG 132 06/05/2013 0400   HDL 50  06/05/2013 0400   CHOLHDL 4.5 06/05/2013 0400   VLDL 26 06/05/2013 0400   LDLCALC 151* 06/05/2013 0400   HgbA1C  Lab Results  Component Value Date   HGBA1C 6.1* 06/05/2013    Urine Drug Screen:      Component Value Date/Time   LABOPIA NONE DETECTED 06/06/2013 0547    Alcohol Level: No results found for this basename: ETH,  in the last 168 hours   CT of the brain   06/04/2013     1. No acute intracranial abnormality. 2. Moderate age-appropriate cortical and deep atrophy and severe chronic microvascular ischemic changes of the white matter.   MRI of the brain   06/05/2013      1. Multi focal ischemic infarcts involving the left frontal, parietal, and occipital lobes as above. These foci are predominantly in a watershed distribution, and may be  related to a hypotensive episode. 2. Advanced age related atrophy with chronic microvascular ischemic disease. 3. Scattered subcentimeter hypo intense foci on gradient echo sequence involving both cerebral hemispheres, most consistent with small chronic micro hemorrhages. Findings may be related to underlying amyloid angiopathy versus chronic hypertensive micro hemorrhages. 4. Small remote lacunar infarcts within the right thalamus and left basal ganglia.    MRA of the brain  06/05/2013     1. No proximal branch occlusion or high-grade flow-limiting stenosis identified. Specifically, no high-grade stenosis identified within the visualized left internal carotid artery or left middle cerebral artery that may contribute to the left cerebral watershed territory infarcts. 2. Short segment mild stenosis of approximately 40% within the proximal right M1 segment. 3. No intracranial aneurysm.  2D Echocardiogram  - normal systolic function. No cardiac source of emboli identified.  Carotid Doppler  Technically limited due to body habitus. Bilateral: 1-39% ICA stenosis. Vertebral artery flow is antegrade on the right. Left vertebral was not insonated.   CXR  06/04/2013    No acute disease.  Linear atelectasis in the lingula is noted.    EKG  Sinus or atopic atrial rhythm.   Therapy Recommendations - skilled nursing facility recommended.  Physical Exam     Neurologic Examination:  Mental Status:  Alert but disoriented. Speech fluent without evidence of aphasia. Able to follow simple step commands without difficulty.  Cranial Nerves:  II: Discs flat bilaterally; Visual fields grossly normal, pupils equal, round, reactive to light and accommodation  III,IV, VI: ptosis not present, extra-ocular motions intact bilaterally  V,VII: smile symmetric, facial light touch sensation normal bilaterally  VIII: hearing normal bilaterally  IX,X: gag reflex present  XI: bilateral shoulder shrug  XII: midline tongue  extension without atrophy or fasciculations  Motor:  Mild right arm weakness.  Tone and bulk:normal tone throughout; no atrophy noted  Sensory: Pinprick and light touch intact throughout, bilaterally  Deep Tendon Reflexes:  Right: Upper Extremity Left: Upper extremity  biceps (C-5 to C-6) 2/4 biceps (C-5 to C-6) 2/4  tricep (C7) 2/4 triceps (C7) 2/4  Brachioradialis (C6) 2/4 Brachioradialis (C6) 2/4  Lower Extremity Lower Extremity  quadriceps (L-2 to L-4) 2/4 quadriceps (L-2 to L-4) 2/4  Achilles (S1) 2/4 Achilles (S1) 2/4  Plantars:  Right: upgoing Left: downgoing  Cerebellar:  normal finger-to-nose, normal heel-to-shin test  Gait:  Deferred   CV: pulses palpable throughout     ASSESSMENT Daisy [name] is a 80 y.o. female presenting with right hemiparesis and confusion. Imaging confirms a left frontal, parietal and occipital infarcts in a watershed distribution. Infarct felt to be  embolic secondary to an unknown source.  On aspirin 325 mg orally every day prior to admission. Now on aspirin 325 mg orally every day for secondary stroke prevention. Patient with resultant  Mild aphasia and weakness Work up complete.  hypertension COPD CHF Acute renal failure, likely pre-renal, placed on IVF Leukocytosis - no signs of infection - improving wbc's 10.5 K today. Hyperlipidemia, LDL 151, on crestor 5 daily PTA, now on lipitor 20 mg daily, goal LDL < 100 (< 70 for diabetics) Hemoglobin A1c 6.1   Hospital day # 2  TREATMENT/PLAN  Will change patient to plavix   Work up complete Will sign off, have the patient see Dr. Pearlean Brownie in 2 months  Physical therapist recommends skilled nursing facility placement. I think family would prefer to take the patient home.  Would consider prolonged cardiac monitor for 3 weeks following discharge.  Delton See PA-C [facility] Pager [phone] 06/06/2013, 11:48 AM  I have personally obtained a history, examined the  patient, evaluated imaging results, and formulated the assessment and plan of care. I agree with the above.  Lesly Dukes

## 2013-06-06 NOTE — Progress Notes (Signed)
Occupational Therapy Evaluation Patient Details Name: Rolene ArbourMolene Pascal MRN: [id number] DOB: 1921/06/19 Today's Date: 06/06/2013 Time: [id number] OT Time Calculation (min): 37 min  OT Assessment / Plan / Recommendation History of present illness Patient is an 80 yo female s/p multiple infarct watershed ischemic CVA.   Clinical Impression   PTA, pt's daughter livedwith her and assisted with basic ADL. Pt mod I with short distances ambulating with rollator. Per family, pt with baseline, "forgetfulness". Pt with significant functional decline due to deficits listed below. Feel pt would benefit from CIR to facilitate return home with family, who can provide 24/7 care after D/C. Discussed possibility of CIR, and family very interested. Pt will benefit from skilled OT services to facilitate D/C to next venue due to below deficits.    OT Assessment  Patient needs continued OT Services    Follow Up Recommendations  CIR    Barriers to Discharge      Equipment Recommendations  None recommended by OT    Recommendations for Other Services Rehab consult  Frequency  Min 3X/week    Precautions / Restrictions Precautions Precautions: Fall Precaution Comments: apraxic Restrictions Weight Bearing Restrictions: No   Pertinent Vitals/Pain no apparent distress     ADL  Grooming: Moderate assistance Where Assessed - Grooming: Supported sitting Upper Body Bathing: Moderate assistance Where Assessed - Upper Body Bathing: Unsupported sitting Lower Body Bathing: Maximal assistance Where Assessed - Lower Body Bathing: Supported sit to stand Upper Body Dressing: Moderate assistance Where Assessed - Upper Body Dressing: Unsupported sitting Lower Body Dressing: Maximal assistance Where Assessed - Lower Body Dressing: Supported sit to stand Toilet Transfer: Moderate assistance Toilet Transfer Method: Stand pivot Toilet Transfer Equipment: Bedside commode Toileting - Clothing Manipulation and Hygiene:  +1 Total assistance Equipment Used: Gait belt Transfers/Ambulation Related to ADLs: mod a stand pivot to L ADL Comments: decline in funcitonal status    OT Diagnosis: Generalized weakness;Cognitive deficits;Disturbance of vision;Altered mental status;Apraxia  OT Problem List: Decreased strength;Decreased range of motion;Decreased activity tolerance;Impaired balance (sitting and/or standing);Decreased coordination;Impaired vision/perception;Decreased cognition;Decreased safety awareness;Decreased knowledge of use of DME or AE;Decreased knowledge of precautions;Cardiopulmonary status limiting activity;Impaired sensation;Obesity;Impaired UE functional use OT Treatment Interventions: Self-care/ADL training;Therapeutic exercise;Neuromuscular education;DME and/or AE instruction;Therapeutic activities;Cognitive remediation/compensation;Visual/perceptual remediation/compensation;Patient/family education;Balance training   OT Goals(Current goals can be found in the care plan section) Acute Rehab OT Goals Patient Stated Goal: per family, to get back to independent level OT Goal Formulation: With patient Time For Goal Achievement: 06/20/13 Potential to Achieve Goals: Good  Visit Information  Last OT Received On: 06/06/13 Assistance Needed: +2 History of Present Illness: Patient is an 80 yo female s/p multiple infarct watershed ischemic CVA.       Prior Functioning     Home Living Family/patient expects to be discharged to:: Private residence Living Arrangements: Children Available Help at Discharge: Family Type of Home: House Home Access: Stairs to enter Secretary/administratorntrance Stairs-Number of Steps: 4 Entrance Stairs-Rails: Right;Left Home Layout: One level Home Equipment: Walker - 4 wheels Prior Function Level of Independence: Independent with assistive device(s);Needs assistance Gait / Transfers Assistance Needed: ambulated short distances with rollator ADL's / Homemaking Assistance Needed: daughter  assisted with basic ADL Communication / Swallowing Assistance Needed: nodifficulty Communication Communication: Expressive difficulties Dominant Hand: Right         Vision/Perception Vision - History Baseline Vision: Wears glasses all the time Vision - Assessment Eye Alignment: Within Functional Limits Vision Assessment: Vision tested Ocular Range of Motion: Within Functional Limits Alignment/Gaze Preference: Within Defined Limits  Tracking/Visual Pursuits: Decreased smoothness of vertical tracking;Decreased smoothness of horizontal tracking Saccades: Impaired - to be further tested in functional context Visual Fields: Right visual field deficit (appears decreased on right. difficult to assess due to cogni) Additional Comments: Pt reports that R side of my face appears dark Perception Perception: Within Functional Limits Praxis Praxis: Impaired Praxis Impairment Details: Motor planning;Ideomotor;Initiation (delay in initiation)   Cognition  Cognition Arousal/Alertness: Awake/alert Behavior During Therapy: WFL for tasks assessed/performed Overall Cognitive Status: Impaired/Different from baseline Area of Impairment: Orientation;Following commands;Safety/judgement;Problem solving;Attention;Memory;Awareness Orientation Level: Disoriented to;Time;Situation Current Attention Level: Sustained Memory: Decreased recall of precautions;Decreased short-term memory Following Commands: Follows one step commands with increased time (better with getural cues) Safety/Judgement: Decreased awareness of deficits;Decreased awareness of safety Awareness: Intellectual Problem Solving: Slow processing;Decreased initiation;Difficulty sequencing;Requires verbal cues;Requires tactile cues General Comments: most likely some baseline dementia - per family    Extremity/Trunk Assessment Upper Extremity Assessment Upper Extremity Assessment: RUE deficits/detail;LUE deficits/detail RUE Deficits /  Details: isloated joint movment. weaker proximally RUE Sensation: decreased proprioception;decreased light touch RUE Coordination: decreased fine motor LUE Deficits / Details: generalized weakness Lower Extremity Assessment Lower Extremity Assessment: Defer to PT evaluation RLE Deficits / Details: asymetrical weakness 3+/5 RLE RLE Coordination: decreased fine motor;decreased gross motor Cervical / Trunk Assessment Cervical / Trunk Assessment: Other exceptions Cervical / Trunk Exceptions: decreased postural control; affected by attentional deficit     Mobility Bed Mobility Overal bed mobility: Needs Assistance Bed Mobility: Supine to Sit Supine to sit: Mod assist;+2 for physical assistance General bed mobility comments: Patient unable to initiate bed mobility or problem solve despite max cues for sequencing Transfers Overall transfer level: Needs assistance Equipment used: 2 person hand held assist Sit to Stand: Min assist;+2 physical assistance General transfer comment: Bilateral support for stability     Exercise     Balance Balance Overall balance assessment: Needs assistance Sitting-balance support: Bilateral upper extremity supported;Feet supported Sitting balance-[name] Scale: Good Standing balance support: During functional activity Standing balance-[name] Scale: Fair General Comments General comments (skin integrity, edema, etc.): multiple bruises   End of Session OT - End of Session Equipment Utilized During Treatment: Gait belt Activity Tolerance: Patient tolerated treatment well Patient left: in chair;in CPM;with family/visitor present  GO     Zahmir Lalla,HILLARY 06/06/2013, 5:47 PM [facility], OTR/L  [phone] 06/06/2013

## 2013-06-06 NOTE — Progress Notes (Signed)
[facility] PROGRESS NOTE  Daisy ArbourMolene [name] ZOX:[id number] DOB: 1921/12/29 DOA: 06/04/2013 PCP: No primary provider on file.  Assessment/Plan: #1 acute CVA MRI of the head with multifocal ischemic infarcts involving left frontal, parietal and occipital lobes. Patient also noted to be hypotensive. Carotid Doppler with no significant ICA stenosis per preliminary report. 2-D echo with no source of emboli. Aspirin changed to plavix for secondary stroke prevention. Patient noted to have elevated LDL and will be started on a statin. Patient may require TEE.  Neurology following and appreciate input and recommendations.  #2 hypotension Patient noted to be on diuretics and antihypertensive medications prior to admission. Patient is currently afebrile. Urinalysis was negative. Chest x-ray also noted to be negative. Will hold antihypertensive medications. Improved with hydration. Follow.  #3 acute renal failure Likely secondary to prerenal azotemia in the setting of ACE inhibitor and diuretics. ACE inhibitor and diuretics on hold. Renal function improving with hydration. Decrease IV fluids. Will follow.  #4 hyperlipidemia LDL of 151. Patient was on 5 mg of Crestor prior to admission and on discharge will likely need to be increased to 10 mg of Crestor. Patient has been started on Lipitor while in house. Follow.  #5 leukocytosis Likely a reactive leukocytosis. Chest x-ray is negative. Urinalysis is negative. No need for antibiotics at this time. Follow.  #6 depression Resumed home regimen of Celexa.  #7 hypertension Antihypertensive medications on hold secondary to problems #2 and 3.  #8 gastroesophageal reflux disease PPI.  #9 prophylaxis PPI for GI prophylaxis. Lovenox for DVT prophylaxis.  Code Status: full Family Communication: updated patient and daughters at bedside. Disposition Plan: skilled nursing facility when medically stable.   Consultants:  Neurology: Dr. Cyril Mourningamillo  06/04/2013  Procedures:  CT head 06/04/2013  MRI/MRA of the head 06/04/2013  2 D Echo 06/05/2013  Carotid Dopplers 06/05/2013  Antibiotics:  none  HPI/Subjective: Patient with some improvement. Right-sided weakness improving. Slurred speech improved.  Objective: Filed Vitals:   06/06/13 1412  BP: 135/70  Pulse: 72  Temp: 98.2 F (36.8 C)  Resp: 20    Intake/Output Summary (Last 24 hours) at 06/06/13 1526 Last data filed at 06/06/13 1224  Gross per 24 hour  Intake 1779.58 ml  Output      0 ml  Net 1779.58 ml   Filed Weights   06/05/13 0030  Weight: 86 kg (189 lb 9.5 oz)    Exam:   General:  Alert. NAD.  Cardiovascular: regular rate rhythm no murmurs rubs or gallops.  Respiratory: clear to auscultation bilaterally in the anterior lung fields.  Abdomen: soft, nontender, nondistended, positive bowel sounds  Musculoskeletal: no clubbing cyanosis or edema  Data Reviewed: Basic Metabolic Panel:  Recent Labs Lab 06/04/13 1931 06/05/13 0350 06/06/13 1102  NA 138 140 141  K 4.2 4.4 4.2  CL 94* 96 103  CO2 30 28 30   GLUCOSE 114* 216* 86  BUN 24* 29* 26*  CREATININE 1.24* 1.22* 0.99  CALCIUM 8.7 8.5 7.9*   Liver Function Tests: No results found for this basename: AST, ALT, ALKPHOS, BILITOT, PROT, ALBUMIN,  in the last 168 hours No results found for this basename: LIPASE, AMYLASE,  in the last 168 hours No results found for this basename: AMMONIA,  in the last 168 hours CBC:  Recent Labs Lab 06/04/13 1931 06/05/13 0350  WBC 11.1* 10.5  HGB 13.2 12.8  HCT 40.2 39.3  MCV 95.7 95.2  PLT 194 195   Cardiac Enzymes:  Recent Labs Lab 06/04/13 1931  TROPONINI <0.30   BNP (last 3 results) No results found for this basename: PROBNP,  in the last 8760 hours CBG:  Recent Labs Lab 06/04/13 1953  GLUCAP 113*    No results found for this or any previous visit (from the past 240 hour(s)).   Studies: Dg Chest 2 View  06/04/2013   CLINICAL  DATA:  Shortness of breath for 1 day.  EXAM: CHEST  2 VIEW  COMPARISON:  PA and lateral chest 05/20/2013.  FINDINGS: There is linear atelectasis in the lingula. The lungs are otherwise clear. Heart size is mildly enlarged. No pneumothorax or pleural effusion.  IMPRESSION: No acute disease.  Linear atelectasis in the lingula is noted.   Electronically Signed   By: Drusilla Kanner M.D.   On: 06/04/2013 21:51   Ct Head Wo Contrast  06/04/2013   CLINICAL DATA:  Left-sided weakness. Acute mental status changes. Unable to answer questions. Code stroke.  EXAM: CT HEAD WITHOUT CONTRAST  TECHNIQUE: Contiguous axial images were obtained from the base of the skull through the vertex without intravenous contrast.  COMPARISON:  None.  FINDINGS: Moderate age-appropriate cortical and deep atrophy. Severe changes of small vessel disease of the white matter diffusely, including the pons. Old lacunar strokes in both basal ganglia and both thalami. No mass lesion. No midline shift. No acute hemorrhage or hematoma. No extra-axial fluid collections. No evidence of acute infarction.  No focal osseous abnormality involving the skull. Visualized paranasal sinuses, bilateral mastoid air cells, and bilateral middle ear cavities well-aerated. bilateral carotid siphon and left vertebral artery atherosclerosis.  IMPRESSION: 1. No acute intracranial abnormality. 2. Moderate age-appropriate cortical and deep atrophy and severe chronic microvascular ischemic changes of the white matter. These results were called by telephone at the time of interpretation on 06/04/2013 at 7:54 PM to Dr. Cyril Mourning of the stroke service, who verbally acknowledged these results.   Electronically Signed   By: Hulan Saas M.D.   On: 06/04/2013 19:55   Mr Maxine Glenn Head Wo Contrast  06/05/2013   CLINICAL DATA:  Right-sided facial droop, now resolved  EXAM: MRI HEAD WITHOUT CONTRAST  MRA HEAD WITHOUT CONTRAST  TECHNIQUE: Multiplanar, multiecho pulse sequences of the  brain and surrounding structures were obtained without intravenous contrast. Angiographic images of the head were obtained using MRA technique without contrast.  COMPARISON:  Prior CT performed earlier on the same day and  FINDINGS: MRI HEAD FINDINGS  Advanced age-related atrophy is present. Extensive scattered and confluent T2/FLAIR hyperintensity within the periventricular and deep white matter is compatible with moderate chronic microvascular ischemic changes.  Cervicomedullary junction is within normal limits. Pituitary gland is normal. Asymmetric FLAIR hyperintensity involving the intraconal and extraconal fat of the left orbit is thought to be artifactual in nature, as similar changes are seen within the adjacent left face.  No mass lesion or midline shift. There is mild ventricular prominence related to generalized cerebral atrophy without evidence of hydrocephalus. No extra-axial fluid collection.  Multiple foci of restricted diffusion are seen involving the cortical gray matter and subcortical white matter of the right frontal, parietal, and occipital lobes (series 5, and image 15 through 25). These foci are predominantly along a watershed distribution involving the left centrum semi ovale. No significant mass effect at this time. No associated hemorrhage seen on gradient echo sequence. No right-sided cerebral infarcts identified. No infratentorial infarct.  A few scattered subcentimeter and T1 hypointense, T2 hyperintense lacunar infarcts are noted within the right thalamus and left basal ganglia.  A few scattered foci of hypo intense signal intensity seen on gradient echo sequence within the right frontal and parietal lobes likely represent chronic micro hemorrhages. The largest of these is located within the right frontal lobe and measures 4 mm (series 10, image 99). Similar foci seen within the right thalamus. Several scattered foci are also noted within the left cerebral hemisphere.  Calvarium  demonstrates a normal appearance with normal signal intensity. Visualized upper cervical spine is grossly normal.  Scalp soft tissues within normal limits.  Paranasal sinuses are clear.  Small right mastoid effusion noted.  MRA HEAD FINDINGS  The visualized portions of the distal cervical internal carotid arteries are within normal limits with widely patent antegrade flow. No high-grade stenosis seen. The cervical segments of the distal internal carotid arteries are tortuous. The petrous, cavernous, and supra clinoid segments of the internal carotid arteries are widely patent without high-grade stenosis or occlusion. The A1 segments are symmetric in caliber with widely patent antegrade flow. Anterior communicating artery and anterior cerebral arteries are within normal limits.  The left M1 segment is widely patent with antegrade flow. No proximal branch occlusion or high-grade flow-limiting stenosis identified. The distal left MCA territory branches are within normal limits.  Mild multi focal irregularity noted within the distal aspect of the right M1 segment without high-grade focal stenosis. A short-segment stenosis of approximately 40% is seen within the proximal right M1 segment (series 703, image 7). The distal right MCA territory branches are normal.  Vertebral arteries are codominant. The posterior inferior cerebellar arteries are widely patent with antegrade flow. Vertebrobasilar junction and basilar artery are within normal limits. No basilar tip stenosis or aneurysm. Posterior cerebellar arteries are widely patent without high-grade stenosis. Question fetal origin of the right PCA. The superior cerebellar arteries and anterior inferior cerebellar arteries are within normal limits.  No intracranial aneurysm identified.  IMPRESSION: MR HEAD:  1. Multi focal ischemic infarcts involving the left frontal, parietal, and occipital lobes as above. These foci are predominantly in a watershed distribution, and may  be related to a hypotensive episode. 2. Advanced age related atrophy with chronic microvascular ischemic disease. 3. Scattered subcentimeter hypo intense foci on gradient echo sequence involving both cerebral hemispheres, most consistent with small chronic micro hemorrhages. Findings may be related to underlying amyloid angiopathy versus chronic hypertensive micro hemorrhages. 4. Small remote lacunar infarcts within the right thalamus and left basal ganglia.  MRA HEAD:  1. No proximal branch occlusion or high-grade flow-limiting stenosis identified. Specifically, no high-grade stenosis identified within the visualized left internal carotid artery or left middle cerebral artery that may contribute to the left cerebral watershed territory infarcts. 2. Short segment mild stenosis of approximately 40% within the proximal right M1 segment. 3. No intracranial aneurysm.   Electronically Signed   By: Rise Mu M.D.   On: 06/05/2013 00:43   Mr Brain Wo Contrast  06/05/2013   CLINICAL DATA:  Right-sided facial droop, now resolved  EXAM: MRI HEAD WITHOUT CONTRAST  MRA HEAD WITHOUT CONTRAST  TECHNIQUE: Multiplanar, multiecho pulse sequences of the brain and surrounding structures were obtained without intravenous contrast. Angiographic images of the head were obtained using MRA technique without contrast.  COMPARISON:  Prior CT performed earlier on the same day and  FINDINGS: MRI HEAD FINDINGS  Advanced age-related atrophy is present. Extensive scattered and confluent T2/FLAIR hyperintensity within the periventricular and deep white matter is compatible with moderate chronic microvascular ischemic changes.  Cervicomedullary junction is within normal limits. Pituitary  gland is normal. Asymmetric FLAIR hyperintensity involving the intraconal and extraconal fat of the left orbit is thought to be artifactual in nature, as similar changes are seen within the adjacent left face.  No mass lesion or midline shift. There  is mild ventricular prominence related to generalized cerebral atrophy without evidence of hydrocephalus. No extra-axial fluid collection.  Multiple foci of restricted diffusion are seen involving the cortical gray matter and subcortical white matter of the right frontal, parietal, and occipital lobes (series 5, and image 15 through 25). These foci are predominantly along a watershed distribution involving the left centrum semi ovale. No significant mass effect at this time. No associated hemorrhage seen on gradient echo sequence. No right-sided cerebral infarcts identified. No infratentorial infarct.  A few scattered subcentimeter and T1 hypointense, T2 hyperintense lacunar infarcts are noted within the right thalamus and left basal ganglia.  A few scattered foci of hypo intense signal intensity seen on gradient echo sequence within the right frontal and parietal lobes likely represent chronic micro hemorrhages. The largest of these is located within the right frontal lobe and measures 4 mm (series 10, image 99). Similar foci seen within the right thalamus. Several scattered foci are also noted within the left cerebral hemisphere.  Calvarium demonstrates a normal appearance with normal signal intensity. Visualized upper cervical spine is grossly normal.  Scalp soft tissues within normal limits.  Paranasal sinuses are clear.  Small right mastoid effusion noted.  MRA HEAD FINDINGS  The visualized portions of the distal cervical internal carotid arteries are within normal limits with widely patent antegrade flow. No high-grade stenosis seen. The cervical segments of the distal internal carotid arteries are tortuous. The petrous, cavernous, and supra clinoid segments of the internal carotid arteries are widely patent without high-grade stenosis or occlusion. The A1 segments are symmetric in caliber with widely patent antegrade flow. Anterior communicating artery and anterior cerebral arteries are within normal limits.   The left M1 segment is widely patent with antegrade flow. No proximal branch occlusion or high-grade flow-limiting stenosis identified. The distal left MCA territory branches are within normal limits.  Mild multi focal irregularity noted within the distal aspect of the right M1 segment without high-grade focal stenosis. A short-segment stenosis of approximately 40% is seen within the proximal right M1 segment (series 703, image 7). The distal right MCA territory branches are normal.  Vertebral arteries are codominant. The posterior inferior cerebellar arteries are widely patent with antegrade flow. Vertebrobasilar junction and basilar artery are within normal limits. No basilar tip stenosis or aneurysm. Posterior cerebellar arteries are widely patent without high-grade stenosis. Question fetal origin of the right PCA. The superior cerebellar arteries and anterior inferior cerebellar arteries are within normal limits.  No intracranial aneurysm identified.  IMPRESSION: MR HEAD:  1. Multi focal ischemic infarcts involving the left frontal, parietal, and occipital lobes as above. These foci are predominantly in a watershed distribution, and may be related to a hypotensive episode. 2. Advanced age related atrophy with chronic microvascular ischemic disease. 3. Scattered subcentimeter hypo intense foci on gradient echo sequence involving both cerebral hemispheres, most consistent with small chronic micro hemorrhages. Findings may be related to underlying amyloid angiopathy versus chronic hypertensive micro hemorrhages. 4. Small remote lacunar infarcts within the right thalamus and left basal ganglia.  MRA HEAD:  1. No proximal branch occlusion or high-grade flow-limiting stenosis identified. Specifically, no high-grade stenosis identified within the visualized left internal carotid artery or left middle cerebral artery that may contribute to  the left cerebral watershed territory infarcts. 2. Short segment mild stenosis of  approximately 40% within the proximal right M1 segment. 3. No intracranial aneurysm.   Electronically Signed   By: Rise Mu M.D.   On: 06/05/2013 00:43    Scheduled Meds: . atorvastatin  20 mg Oral q1800  . citalopram  20 mg Oral Daily  . [START ON 06/07/2013] clopidogrel  75 mg Oral Q breakfast  . enoxaparin (LOVENOX) injection  40 mg Subcutaneous Q24H  . loratadine  10 mg Oral Daily  . mometasone-formoterol  2 puff Inhalation BID  . pantoprazole  40 mg Oral Q0600   Continuous Infusions: . sodium chloride 125 mL/hr at 06/05/13 2251    Principal Problem:   CVA (cerebral infarction) Active Problems:   Hypotension, unspecified   Other and unspecified hyperlipidemia   Acute renal failure   Leukocytosis, unspecified   Essential hypertension, benign   Depression   GERD (gastroesophageal reflux disease)    Time spent: 35 minutes    THOMPSON,DANIEL M.D. [facility] Pager [phone]. If 7PM-7AM, please contact night-coverage at [email or web address], password [facility] 06/06/2013, 3:26 PM  LOS: 2 days

## 2013-06-07 DIAGNOSIS — I10 Essential (primary) hypertension: Secondary | ICD-10-CM

## 2013-06-07 DIAGNOSIS — E785 Hyperlipidemia, unspecified: Secondary | ICD-10-CM

## 2013-06-07 LAB — BASIC METABOLIC PANEL
BUN: 18 mg/dL (ref 6–23)
CHLORIDE: 102 meq/L (ref 96–112)
CO2: 29 mEq/L (ref 19–32)
Calcium: 8.3 mg/dL — ABNORMAL LOW (ref 8.4–10.5)
Creatinine, Ser: 0.91 mg/dL (ref 0.50–1.10)
GFR calc non Af Amer: 54 mL/min — ABNORMAL LOW (ref >90)
GFR, EST AFRICAN AMERICAN: 63 mL/min — AB (ref >90)
Glucose, Bld: 87 mg/dL (ref 70–99)
POTASSIUM: 4 meq/L (ref 3.7–5.3)
Sodium: 142 mEq/L (ref 137–147)

## 2013-06-07 MED ORDER — CARVEDILOL 25 MG PO TABS
25.0000 mg | ORAL_TABLET | Freq: Two times a day (BID) | ORAL | Status: DC
  Administered 2013-06-07 – 2013-06-08 (×3): 25 mg via ORAL
  Filled 2013-06-07 (×5): qty 1

## 2013-06-07 MED ORDER — AMLODIPINE BESYLATE 10 MG PO TABS
10.0000 mg | ORAL_TABLET | Freq: Every day | ORAL | Status: DC
  Administered 2013-06-07: 10 mg via ORAL
  Filled 2013-06-07: qty 1

## 2013-06-07 MED ORDER — CITALOPRAM HYDROBROMIDE 20 MG PO TABS
30.0000 mg | ORAL_TABLET | Freq: Every day | ORAL | Status: DC
  Administered 2013-06-07 – 2013-06-08 (×2): 30 mg via ORAL
  Filled 2013-06-07 (×2): qty 1

## 2013-06-07 MED ORDER — AMLODIPINE BESYLATE 5 MG PO TABS
5.0000 mg | ORAL_TABLET | Freq: Every day | ORAL | Status: DC
  Administered 2013-06-08: 5 mg via ORAL
  Filled 2013-06-07: qty 1

## 2013-06-07 NOTE — Progress Notes (Signed)
[facility] PROGRESS NOTE  Rolene ArbourMolene Saleeby ZOX:[id number] DOB: 07/18/1921 DOA: 06/04/2013 PCP: No primary provider on file.  Assessment/Plan: #1 acute CVA MRI of the head with multifocal ischemic infarcts involving left frontal, parietal and occipital lobes. Patient also noted to be hypotensive. Carotid Doppler with no significant ICA stenosis per preliminary report. 2-D echo with no source of emboli. Aspirin changed to plavix for secondary stroke prevention. Patient noted to have elevated LDL and will be started on a statin. Patient may require TEE.  Neurology following and appreciate input and recommendations.  #2 hypotension Patient noted to be on diuretics and antihypertensive medications prior to admission. Patient is currently afebrile. Urinalysis was negative. Chest x-ray also noted to be negative. Coreg and norvasc resumed. Monitor.  #3 acute renal failure Likely secondary to prerenal azotemia in the setting of ACE inhibitor and diuretics. ACE inhibitor and diuretics on hold. Renal function improving with hydration. Decrease IV fluids. Will follow.  #4 hyperlipidemia LDL of 151. Patient was on 5 mg of Crestor prior to admission and on discharge will likely need to be increased to 10 mg of Crestor. Patient has been started on Lipitor while in house. Follow.  #5 leukocytosis Likely a reactive leukocytosis. Chest x-ray is negative. Urinalysis is negative. No need for antibiotics at this time. Follow.  #6 depression Resumed home regimen of Celexa.  #7 hypertension Antihypertensive medications of coreg and norvasc resumed.  #8 gastroesophageal reflux disease PPI.  #9 prophylaxis PPI for GI prophylaxis. Lovenox for DVT prophylaxis.  Code Status: full Family Communication: updated patient and daughters at bedside. Disposition Plan: skilled nursing facility when medically stable.   Consultants:  Neurology: Dr. Cyril Mourningamillo 06/04/2013  Procedures:  CT head  06/04/2013  MRI/MRA of the head 06/04/2013  2 D Echo 06/05/2013  Carotid Dopplers 06/05/2013  Antibiotics:  none  HPI/Subjective: Patient with some improvement. Right-sided weakness improving. Slurred speech improved.  Objective: Filed Vitals:   06/07/13 0958  BP: 139/61  Pulse: 73  Temp: 98.8 F (37.1 C)  Resp: 20    Intake/Output Summary (Last 24 hours) at 06/07/13 1125 Last data filed at 06/07/13 0600  Gross per 24 hour  Intake   1160 ml  Output    450 ml  Net    710 ml   Filed Weights   06/05/13 0030  Weight: 86 kg (189 lb 9.5 oz)    Exam:   General:  Alert. NAD.  Cardiovascular: regular rate rhythm no murmurs rubs or gallops.  Respiratory: clear to auscultation bilaterally in the anterior lung fields.  Abdomen: soft, nontender, nondistended, positive bowel sounds  Musculoskeletal: no clubbing cyanosis or edema  Data Reviewed: Basic Metabolic Panel:  Recent Labs Lab 06/04/13 1931 06/05/13 0350 06/06/13 1102 06/07/13 0605  NA 138 140 141 142  K 4.2 4.4 4.2 4.0  CL 94* 96 103 102  CO2 30 28 30 29   GLUCOSE 114* 216* 86 87  BUN 24* 29* 26* 18  CREATININE 1.24* 1.22* 0.99 0.91  CALCIUM 8.7 8.5 7.9* 8.3*   Liver Function Tests: No results found for this basename: AST, ALT, ALKPHOS, BILITOT, PROT, ALBUMIN,  in the last 168 hours No results found for this basename: LIPASE, AMYLASE,  in the last 168 hours No results found for this basename: AMMONIA,  in the last 168 hours CBC:  Recent Labs Lab 06/04/13 1931 06/05/13 0350  WBC 11.1* 10.5  HGB 13.2 12.8  HCT 40.2 39.3  MCV 95.7 95.2  PLT 194 195   Cardiac  Enzymes:  Recent Labs Lab 06/04/13 1931  TROPONINI <0.30   BNP (last 3 results) No results found for this basename: PROBNP,  in the last 8760 hours CBG:  Recent Labs Lab 06/04/13 1953  GLUCAP 113*    No results found for this or any previous visit (from the past 240 hour(s)).   Studies: No results found.  Scheduled  Meds: . amLODipine  10 mg Oral Daily  . atorvastatin  20 mg Oral q1800  . carvedilol  25 mg Oral BID WC  . citalopram  30 mg Oral Daily  . clopidogrel  75 mg Oral Q breakfast  . enoxaparin (LOVENOX) injection  40 mg Subcutaneous Q24H  . loratadine  10 mg Oral Daily  . mometasone-formoterol  2 puff Inhalation BID  . pantoprazole  40 mg Oral Q0600   Continuous Infusions:    Principal Problem:   CVA (cerebral infarction) Active Problems:   Hypotension, unspecified   Other and unspecified hyperlipidemia   Acute renal failure   Leukocytosis, unspecified   Essential hypertension, benign   Depression   GERD (gastroesophageal reflux disease)    Time spent: 35 minutes    Kiandra Sanguinetti M.D. [facility] Pager [phone]. If 7PM-7AM, please contact night-coverage at [email or web address], password [facility] 06/07/2013, 11:25 AM  LOS: 3 days

## 2013-06-07 NOTE — Consult Note (Signed)
Physical Medicine and Rehabilitation Consult  Reason for Consult: Right sided weakness and confusion Referring Physician:  Dr Janee Mornhompson.    HPI: Daisy [name] is a 80 y.o. female with  past medical history significant for CHF, HTN, COPD, brought in by EMS on 06/04/13  as a code stroke due to acute onset of the right sided weakness and confusion.  Upon arrival to ED she was alert but confused and had initial NIHSS 6, with subsequent improvement while still in the ED. CT head negative for acute changes. MTI brain with multi focal ischemic infarcts involving the left frontal, parietal, and occipital lobes in predominantly in a watershed distribution, and may be related to a hypotensive episode.  Acute renal failure treated with diuretics.  Carotid dopplers with 1-39% ICA stenosis. 2D echo with LVH, systolic function normal and no wall abnormality. Neurology recommended changing patient to plavix for secondary stroke prevention.    Review of Systems  HENT: Positive for hearing loss.   Eyes: Negative for blurred vision and double vision.  Respiratory: Negative for cough and shortness of breath.   Cardiovascular: Negative for chest pain and palpitations.  Gastrointestinal: Negative for heartburn and nausea.  Genitourinary: Positive for urgency and frequency.       Incontinence  Musculoskeletal: Positive for joint pain (right knee endstage DJD).  Neurological: Positive for weakness. Negative for headaches.  Psychiatric/Behavioral: Positive for memory loss. The patient does not have insomnia.     Past Medical History  Diagnosis Date  . Other and unspecified hyperlipidemia 06/05/2013  . Essential hypertension, benign 06/05/2013  . Depression 06/05/2013  . GERD (gastroesophageal reflux disease) 06/05/2013    History reviewed. No pertinent past surgical history.   No family history on file.   Social History:  Lives with daughter.  Able to ambulate short distances with RW. Unable to get off low  surfaces without assist. Needed assist with ADLs/toileting. Per  reports that she has never smoked. She has never used smokeless tobacco. Per reports that she does not drink alcohol or use illicit drugs.   Allergies  Allergen Reactions  . Aricept [Donepezil Hcl] Other (See Comments)    "has nightmares"  . Codeine     REACTION: Pt. unsure.  Marland Kitchen. Ketek [Telithromycin] Other (See Comments)    "fearful state, not herself"   Medications Prior to Admission  Medication Sig Dispense Refill  . acetaminophen (TYLENOL) 500 MG tablet Take 1,000 mg by mouth every 6 (six) hours as needed for moderate pain.      Marland Kitchen. amLODipine (NORVASC) 10 MG tablet Take 10 mg by mouth daily.      Marland Kitchen. aspirin EC 325 MG tablet Take 325 mg by mouth daily as needed for mild pain.      . carvedilol (COREG) 25 MG tablet Take 25 mg by mouth 2 (two) times daily with a meal.      . cetirizine (ZYRTEC) 10 MG tablet Take 10 mg by mouth every evening.      . citalopram (CELEXA) 20 MG tablet Take 30 mg by mouth daily.      . ferrous sulfate 325 (65 FE) MG tablet Take 325 mg by mouth daily.       . Fluticasone-Salmeterol (ADVAIR) 250-50 MCG/DOSE AEPB Inhale 1 puff into the lungs 2 (two) times daily.      . furosemide (LASIX) 40 MG tablet Take 40 mg by mouth 2 (two) times daily.      . hydrocortisone cream 1 % Apply 1 application topically 2 (  two) times daily as needed (rash).      Marland Kitchen lisinopril (PRINIVIL,ZESTRIL) 40 MG tablet Take 40 mg by mouth daily.      Marland Kitchen omeprazole (PRILOSEC) 20 MG capsule Take 20 mg by mouth daily.      Marland Kitchen PROAIR HFA 108 (90 BASE) MCG/ACT inhaler Inhale 1 puff into the lungs every 6 (six) hours as needed. For wheezing/shortness of breath      . rosuvastatin (CRESTOR) 5 MG tablet Take 5 mg by mouth at bedtime.        Home: Home Living Family/patient expects to be discharged to:: Private residence Living Arrangements: Children Available Help at Discharge: Family Type of Home: House Home Access: Stairs to  enter Secretary/administrator of Steps: 4 Entrance Stairs-Rails: Right;Left Home Layout: One level Home Equipment: Environmental consultant - 4 wheels  Functional History:   Functional Status:  Mobility:     Ambulation/Gait Ambulation Distance (Feet): 60 Feet Gait velocity: decreased General Gait Details: high fall risk, poor stability, increased fwd flexion    ADL: ADL Grooming: Moderate assistance Where Assessed - Grooming: Supported sitting Upper Body Bathing: Moderate assistance Where Assessed - Upper Body Bathing: Unsupported sitting Lower Body Bathing: Maximal assistance Where Assessed - Lower Body Bathing: Supported sit to stand Upper Body Dressing: Moderate assistance Where Assessed - Upper Body Dressing: Unsupported sitting Lower Body Dressing: Maximal assistance Where Assessed - Lower Body Dressing: Supported sit to stand Toilet Transfer: Moderate assistance Toilet Transfer Method: Stand pivot Toilet Transfer Equipment: Bedside commode Equipment Used: Gait belt Transfers/Ambulation Related to ADLs: mod a stand pivot to L ADL Comments: decline in funcitonal status  Cognition: Cognition Overall Cognitive Status: Impaired/Different from baseline Orientation Level: Oriented to person;Oriented to place;Disoriented to time;Disoriented to situation Cognition Arousal/Alertness: Awake/alert Behavior During Therapy: WFL for tasks assessed/performed Overall Cognitive Status: Impaired/Different from baseline Area of Impairment: Orientation;Following commands;Safety/judgement;Problem solving;Attention;Memory;Awareness Orientation Level: Disoriented to;Time;Situation Current Attention Level: Sustained Memory: Decreased recall of precautions;Decreased short-term memory Following Commands: Follows one step commands with increased time (better with getural cues) Safety/Judgement: Decreased awareness of deficits;Decreased awareness of safety Awareness: Intellectual Problem Solving: Slow  processing;Decreased initiation;Difficulty sequencing;Requires verbal cues;Requires tactile cues General Comments: most likely some baseline dementia - per family  Blood pressure 102/51, pulse 74, temperature 98.4 F (36.9 C), temperature source Oral, resp. rate 18, height 5\' 1"  (1.549 m), weight 86 kg (189 lb 9.5 oz), SpO2 100.00%. Physical Exam  Nursing note and vitals reviewed. Constitutional: She appears well-developed and well-nourished.  HENT:  Head: Normocephalic and atraumatic.  Eyes: Conjunctivae are normal. Pupils are equal, round, and reactive to light.  Neck: Normal range of motion. Neck supple.  Cardiovascular: Normal rate and regular rhythm.   Murmur heard. Respiratory: Effort normal and breath sounds normal. No respiratory distress. She has no wheezes.  GI: Soft. Bowel sounds are normal. She exhibits no distension. There is no tenderness.  Musculoskeletal: She exhibits edema (1+pedal edema bilaterally).  Neurological: She is alert.  Oriented to self and place. Pleasant and appropriate. Able to follow simple one step commands. Poor awareness of deficits with impaired insight. Right sided limb ataxia. Displays some apraxia as well. RUE is 3+ to 4-/5. RLE is grossly 3 with HF to 4 distally with ADF and APF. Senses pain fairly equally from side to side  Skin: Skin is warm and dry.  Psychiatric: She has a normal mood and affect. Her behavior is normal.    Results for orders placed during the hospital encounter of 06/04/13 (from the past 24  hour(s))  BASIC METABOLIC PANEL     Status: Abnormal   Collection Time    06/07/13  6:05 AM      Result Value Range   Sodium 142  137 - 147 mEq/L   Potassium 4.0  3.7 - 5.3 mEq/L   Chloride 102  96 - 112 mEq/L   CO2 29  19 - 32 mEq/L   Glucose, Bld 87  70 - 99 mg/dL   BUN 18  6 - 23 mg/dL   Creatinine, Ser 1.61  0.50 - 1.10 mg/dL   Calcium 8.3 (*) 8.4 - 10.5 mg/dL   GFR calc non Af Amer 54 (*) >90 mL/min   GFR calc Af Amer 63 (*) >90  mL/min   No results found.  Assessment/Plan: Diagnosis: left brain watershed infarct 1. Does the need for close, 24 hr/day medical supervision in concert with the patient's rehab needs make it unreasonable for this patient to be served in a less intensive setting? Yes 2. Co-Morbidities requiring supervision/potential complications: depression, htn, GERD 3. Due to bladder management, bowel management, safety, skin/wound care, disease management, medication administration, pain management and patient education, does the patient require 24 hr/day rehab nursing? Yes 4. Does the patient require coordinated care of a physician, rehab nurse, PT (1-2 hrs/day, 5 days/week), OT (1-2 hrs/day, 5 days/week) and SLP (1-2 hrs/day, 5 days/week) to address physical and functional deficits in the context of the above medical diagnosis(es)? Yes Addressing deficits in the following areas: balance, endurance, locomotion, strength, transferring, bowel/bladder control, bathing, dressing, feeding, grooming, toileting, cognition, speech and psychosocial support 5. Can the patient actively participate in an intensive therapy program of at least 3 hrs of therapy per day at least 5 days per week? Yes 6. The potential for patient to make measurable gains while on inpatient rehab is good 7. Anticipated functional outcomes upon discharge from inpatient rehab are min assist to supervision with PT, min assist with OT, supervision with SLP. 8. Estimated rehab length of stay to reach the above functional goals is: 10-13 days 9. Does the patient have adequate social supports to accommodate these discharge functional goals? Yes 10. Anticipated D/C setting: Home 11. Anticipated post D/C treatments: HH therapy 12. Overall Rehab/Functional Prognosis: good  RECOMMENDATIONS: This patient's condition is appropriate for continued rehabilitative care in the following setting: CIR Patient has agreed to participate in recommended program.  Yes Note that insurance prior authorization may be required for reimbursement for recommended care.  Comment: Rehab Admissions Coordinator to follow up.  Thanks,  Ranelle Oyster, MD, [place] [facility]     06/07/2013

## 2013-06-07 NOTE — Progress Notes (Signed)
Went in room to assist patient to get oob to chair, however family member said the patient was exhausted and sleeping at the moment; family notified to please call staff when patient awakes and if she feels up to it we can get her into the chair

## 2013-06-08 ENCOUNTER — Encounter (HOSPITAL_COMMUNITY): Payer: Self-pay | Admitting: *Deleted

## 2013-06-08 DIAGNOSIS — I6330 Cerebral infarction due to thrombosis of unspecified cerebral artery: Secondary | ICD-10-CM

## 2013-06-08 LAB — BASIC METABOLIC PANEL
BUN: 23 mg/dL (ref 6–23)
CO2: 29 meq/L (ref 19–32)
Calcium: 8.3 mg/dL — ABNORMAL LOW (ref 8.4–10.5)
Chloride: 101 mEq/L (ref 96–112)
Creatinine, Ser: 1.21 mg/dL — ABNORMAL HIGH (ref 0.50–1.10)
GFR calc Af Amer: 45 mL/min — ABNORMAL LOW (ref >90)
GFR calc non Af Amer: 38 mL/min — ABNORMAL LOW (ref >90)
GLUCOSE: 106 mg/dL — AB (ref 70–99)
Potassium: 4.5 mEq/L (ref 3.7–5.3)
SODIUM: 139 meq/L (ref 137–147)

## 2013-06-08 MED ORDER — AMLODIPINE BESYLATE 2.5 MG PO TABS
2.5000 mg | ORAL_TABLET | Freq: Every day | ORAL | Status: DC
  Administered 2013-06-09: 2.5 mg via ORAL
  Filled 2013-06-08: qty 1

## 2013-06-08 MED ORDER — CARVEDILOL 12.5 MG PO TABS
12.5000 mg | ORAL_TABLET | Freq: Two times a day (BID) | ORAL | Status: DC
  Administered 2013-06-09: 12.5 mg via ORAL
  Filled 2013-06-08 (×3): qty 1

## 2013-06-08 MED ORDER — CITALOPRAM HYDROBROMIDE 20 MG PO TABS
20.0000 mg | ORAL_TABLET | Freq: Every day | ORAL | Status: DC
  Administered 2013-06-09: 20 mg via ORAL
  Filled 2013-06-08: qty 1

## 2013-06-08 MED ORDER — ASPIRIN 325 MG PO TABS
325.0000 mg | ORAL_TABLET | Freq: Every day | ORAL | Status: DC
  Administered 2013-06-09: 325 mg via ORAL
  Filled 2013-06-08: qty 1

## 2013-06-08 NOTE — Progress Notes (Signed)
Clinical Social Work Department CLINICAL SOCIAL WORK PLACEMENT NOTE 06/08/2013  Patient:  Daisy [name],[name]  Account Number:  [id number] Admit date:  06/04/2013  Clinical Social Worker:  Jetta LoutBAILEY MORGAN, Theresia MajorsLCSWA  Date/time:  06/08/2013 06:42 PM  Clinical Social Work is seeking post-discharge placement for this patient at the following level of care:   SKILLED NURSING   (*CSW will update this form in Epic as items are completed)   06/08/2013  Patient/family provided with [facility] of Clinical Social Work's list of facilities offering this level of care within the geographic area requested by the patient (or if unable, by the patient's family).  06/08/2013  Patient/family informed of their freedom to choose among providers that offer the needed level of care, that participate in Medicare, Medicaid or managed care program needed by the patient, have an available bed and are willing to accept the patient.  06/08/2013  Patient/family informed of [facility]' ownership interest in [facility], as well as of the fact that they are under no obligation to receive care at this facility.  PASARR submitted to EDS on 06/08/2013 PASARR number received from EDS on 06/08/2013  FL2 transmitted to all facilities in geographic area requested by pt/family on  06/08/2013 FL2 transmitted to all facilities within larger geographic area on   Patient informed that his/her managed care company has contracts with or will negotiate with  certain facilities, including the following:     Patient/family informed of bed offers received:   Patient chooses bed at  Physician recommends and patient chooses bed at    Patient to be transferred to  on   Patient to be transferred to facility by   The following physician request were entered in Epic:   Additional Comments: Clinicals have been sent to [facility] for SNF authorization.

## 2013-06-08 NOTE — Progress Notes (Signed)
Physical Therapy Treatment Patient Details Name: Daisy [name] MRN: [id number] DOB: 08-13-1921 Today's Date: 06/08/2013 Time: [id number] PT Time Calculation (min): 31 min  PT Assessment / Plan / Recommendation  History of Present Illness Patient is an 80 yo female s/p multiple infarct watershed ischemic CVA.   PT Comments   Patient demonstrates some improvements in mobility today. As patient is more mobile, it is evident that patient demonstrates a right sided inattention. Patient able to correct appropriately with verbal and tactile cues. Continue to feel patient would benefit from CIR upon acute discharge.   Follow Up Recommendations  CIR           Equipment Recommendations  None recommended by PT       Frequency Min 3X/week   Progress towards PT Goals  Progressing towards PT goals  Plan Current plan remains appropriate    Precautions / Restrictions Precautions Precautions: Fall Precaution Comments: apraxic Restrictions Weight Bearing Restrictions: No   Pertinent Vitals/Pain Sore back at this time, no other complaints    Mobility  Bed Mobility Overal bed mobility: Needs Assistance Bed Mobility: Supine to Sit Supine to sit: Mod assist General bed mobility comments: Patient unable to initiate bed mobility or problem solve despite max cues for sequencing Transfers Overall transfer level: Needs assistance Equipment used: Rolling walker (2 wheeled) Sit to Stand: Min assist General transfer comment: assist to come to standing, assist for stability Ambulation/Gait Ambulation/Gait assistance: Min assist Ambulation Distance (Feet): 140 Feet Assistive device: Rolling walker (2 wheeled) Gait Pattern/deviations: Decreased stride length;Shuffle;Drifts right/left;Trunk flexed;Narrow base of support Gait velocity: decreased Gait velocity interpretation: <1.8 ft/sec, indicative of risk for recurrent falls General Gait Details: high fall risk, poor stability, increased fwd  flexion, some right side inattention    Exercises  Ankle circles, long arc quads (both sides)     PT Goals (current goals can now be found in the care plan section) Acute Rehab PT Goals Patient Stated Goal: per family, to get back to independent level PT Goal Formulation: With patient/family Time For Goal Achievement: 06/19/13 Potential to Achieve Goals: Good  Visit Information  Last PT Received On: 06/08/13 Assistance Needed: +1 History of Present Illness: Patient is an 80 yo female s/p multiple infarct watershed ischemic CVA.    Subjective Data  Subjective: I would love to get out of this bed Patient Stated Goal: per family, to get back to independent level   Cognition  Cognition Arousal/Alertness: Awake/alert Behavior During Therapy: WFL for tasks assessed/performed Overall Cognitive Status: Impaired/Different from baseline Area of Impairment: Orientation;Following commands;Safety/judgement;Problem solving Orientation Level: Disoriented to;Time Following Commands: Follows one step commands inconsistently;Follows one step commands with increased time Safety/Judgement: Decreased awareness of deficits Awareness: Intellectual Problem Solving: Slow processing;Decreased initiation;Difficulty sequencing;Requires verbal cues;Requires tactile cues    Balance  Balance Overall balance assessment: Needs assistance Sitting-balance support: Bilateral upper extremity supported Sitting balance-[name] Scale: Good Standing balance support: During functional activity Standing balance-[name] Scale: Fair  End of Session PT - End of Session Equipment Utilized During Treatment: Gait belt Activity Tolerance: Patient tolerated treatment well Patient left: in chair;with call bell/phone within reach;with family/visitor present Nurse Communication: Mobility status   GP     Fabio AsaWerner, Shynia Daleo J 06/08/2013, 12:12 PM Charlotte Crumbevon Renaldo Gornick, PT DPT  [phone]

## 2013-06-08 NOTE — Progress Notes (Signed)
[facility] PROGRESS NOTE  Daisy [name] RUE:[id number] DOB: 01-Apr-1922 DOA: 06/04/2013 PCP: No primary provider on file.  Assessment/Plan: #1 acute CVA MRI of the head with multifocal ischemic infarcts involving left frontal, parietal and occipital lobes. Patient also noted to be hypotensive on admission. Carotid Doppler with no significant ICA stenosis per preliminary report. 2-D echo with no source of emboli. Aspirin changed to plavix for secondary stroke prevention. Family states patient was not on daily aspirin and would like plavix changed to ASA. Discussed with neurology and will change plavix to full dose aspirin. Patient noted to have elevated LDL and will be started on a statin. Patient may require TEE.  Neurology following and appreciate input and recommendations.  #2 hypotension Patient noted to be on diuretics and antihypertensive medications prior to admission. Patient is currently afebrile. Urinalysis was negative. Chest x-ray also noted to be negative. Coreg and norvasc resumed. Decrease coreg to half home dose. Monitor.  #3 acute renal failure Likely secondary to prerenal azotemia in the setting of ACE inhibitor and diuretics. ACE inhibitor and diuretics on hold. Renal function improving with hydration. NSL IV fluids. Will follow.  #4 hyperlipidemia LDL of 151. Patient was on 5 mg of Crestor prior to admission and on discharge will likely need to be increased to 10 mg of Crestor. Patient has been started on Lipitor while in house. Follow.  #5 leukocytosis Likely a reactive leukocytosis. Chest x-ray is negative. Urinalysis is negative. No need for antibiotics at this time. Follow.  #6 depression Resumed home regimen of Celexa.  #7 hypertension Antihypertensive medications of coreg and norvasc resumed. BP borderline. Decrease coreg to 1/2 home dose.  #8 gastroesophageal reflux disease PPI.  #9 prophylaxis PPI for GI prophylaxis. Lovenox for DVT prophylaxis.  Code  Status: full Family Communication: updated patient and daughter at bedside. Disposition Plan: skilled nursing facility when medically stable.   Consultants:  Neurology: Dr. Cyril Mourningamillo 06/04/2013  Procedures:  CT head 06/04/2013  MRI/MRA of the head 06/04/2013  2 D Echo 06/05/2013  Carotid Dopplers 06/05/2013  Antibiotics:  none  HPI/Subjective: Patient with some improvement. Right-sided weakness improving. Slurred speech improved.  Objective: Filed Vitals:   06/08/13 0922  BP: 114/66  Pulse: 73  Temp: 98.1 F (36.7 C)  Resp: 18    Intake/Output Summary (Last 24 hours) at 06/08/13 1202 Last data filed at 06/08/13 0730  Gross per 24 hour  Intake    120 ml  Output      0 ml  Net    120 ml   Filed Weights   06/05/13 0030  Weight: 86 kg (189 lb 9.5 oz)    Exam:   General:  Alert. NAD.  Cardiovascular: regular rate rhythm no murmurs rubs or gallops.  Respiratory: clear to auscultation bilaterally in the anterior lung fields.  Abdomen: soft, nontender, nondistended, positive bowel sounds  Musculoskeletal: no clubbing cyanosis or edema  Data Reviewed: Basic Metabolic Panel:  Recent Labs Lab 06/04/13 1931 06/05/13 0350 06/06/13 1102 06/07/13 0605 06/08/13 0413  NA 138 140 141 142 139  K 4.2 4.4 4.2 4.0 4.5  CL 94* 96 103 102 101  CO2 30 28 30 29 29   GLUCOSE 114* 216* 86 87 106*  BUN 24* 29* 26* 18 23  CREATININE 1.24* 1.22* 0.99 0.91 1.21*  CALCIUM 8.7 8.5 7.9* 8.3* 8.3*   Liver Function Tests: No results found for this basename: AST, ALT, ALKPHOS, BILITOT, PROT, ALBUMIN,  in the last 168 hours No results found for  this basename: LIPASE, AMYLASE,  in the last 168 hours No results found for this basename: AMMONIA,  in the last 168 hours CBC:  Recent Labs Lab 06/04/13 1931 06/05/13 0350  WBC 11.1* 10.5  HGB 13.2 12.8  HCT 40.2 39.3  MCV 95.7 95.2  PLT 194 195   Cardiac Enzymes:  Recent Labs Lab 06/04/13 1931  TROPONINI <0.30    BNP (last 3 results) No results found for this basename: PROBNP,  in the last 8760 hours CBG:  Recent Labs Lab 06/04/13 1953  GLUCAP 113*    No results found for this or any previous visit (from the past 240 hour(s)).   Studies: No results found.  Scheduled Meds: . amLODipine  5 mg Oral Daily  . atorvastatin  20 mg Oral q1800  . carvedilol  25 mg Oral BID WC  . citalopram  30 mg Oral Daily  . clopidogrel  75 mg Oral Q breakfast  . enoxaparin (LOVENOX) injection  40 mg Subcutaneous Q24H  . loratadine  10 mg Oral Daily  . mometasone-formoterol  2 puff Inhalation BID  . pantoprazole  40 mg Oral Q0600   Continuous Infusions:    Principal Problem:   CVA (cerebral infarction) Active Problems:   Hypotension, unspecified   Other and unspecified hyperlipidemia   Acute renal failure   Leukocytosis, unspecified   Essential hypertension, benign   Depression   GERD (gastroesophageal reflux disease)    Time spent: 35 minutes    Jerine Surles M.D. [facility] Pager [phone]. If 7PM-7AM, please contact night-coverage at [email or web address], password [facility] 06/08/2013, 12:02 PM  LOS: 4 days

## 2013-06-08 NOTE — Progress Notes (Signed)
Clinical Social Work Department BRIEF PSYCHOSOCIAL ASSESSMENT 06/08/2013  Patient:  Daisy [name],Daisy [name]     Account Number:  [id number]     Admit date:  06/04/2013  Clinical Social Worker:  Hendricks MiloMORGAN,Skylier Kretschmer, LCSWA  Date/Time:  06/08/2013 06:38 PM  Referred by:  Physician  Date Referred:  06/07/2013 Referred for  SNF Placement   Other Referral:   Interview type:  Family Other interview type:    PSYCHOSOCIAL DATA Living Status:  FAMILY Admitted from facility:   Level of care:   Primary support name:  Daisy [name] [phone] Primary support relationship to patient:  CHILD, ADULT Degree of support available:   Very supportive, completed assessment over the phone.    CURRENT CONCERNS  Other Concerns:    SOCIAL WORK ASSESSMENT / PLAN Clinical Social Worker (CSW) contacted patient's daughter Daisy [name] because per chart patient is not alert and oriented. Daughter reported that she would like patient to go to CIR but [place] agreeable [place]. D[facility]. CSW explained to daughter that patient's insurance [employer] might not cover CIR. Daughter verbalized her understanding.   Assessment/plan status:  Psychosocial Support/Ongoing Assessment of Needs Other assessment/ plan:   Information/referral to community resources:    PATIENT'S/FAMILY'S RESPONSE TO PLAN OF CARE: Daughter thanked CSW for calling.

## 2013-06-08 NOTE — Progress Notes (Signed)
Rehab Admissions Coordinator Note:  Patient was screened by Deshara Rossi L for appropriateness for an Inpatient Acute Rehab Consult.  At this time,an inpatient rehab consult is pending completion.  Juliann MuleJanine Teneka Malmberg, PT Rehabilitation Admissions Coordinator [phone]

## 2013-06-08 NOTE — Progress Notes (Signed)
Rehab admissions - Evaluated for possible admission.  I met with patient and her daughter.  Daughter would like inpatient rehab admission.  I will open the case with [facility] and fax information requesting acute inpatient rehab admission.  I will follow up after I hear back from [facility] carrier.  Call me for questions.  #[phone]

## 2013-06-09 ENCOUNTER — Encounter: Payer: Self-pay | Admitting: Internal Medicine

## 2013-06-09 LAB — BASIC METABOLIC PANEL WITH GFR
BUN: 22 mg/dL (ref 6–23)
CO2: 30 meq/L (ref 19–32)
Calcium: 8.5 mg/dL (ref 8.4–10.5)
Chloride: 101 meq/L (ref 96–112)
Creatinine, Ser: 1.06 mg/dL (ref 0.50–1.10)
GFR calc Af Amer: 52 mL/min — ABNORMAL LOW
GFR calc non Af Amer: 45 mL/min — ABNORMAL LOW
Glucose, Bld: 95 mg/dL (ref 70–99)
Potassium: 4.3 meq/L (ref 3.7–5.3)
Sodium: 141 meq/L (ref 137–147)

## 2013-06-09 MED ORDER — CITALOPRAM HYDROBROMIDE 20 MG PO TABS: 20.0000 mg | ORAL_TABLET | Freq: Every day | ORAL | Status: DC

## 2013-06-09 MED ORDER — ROSUVASTATIN CALCIUM 10 MG PO TABS: 10.0000 mg | ORAL_TABLET | Freq: Every day | ORAL | Status: DC

## 2013-06-09 MED ORDER — AMLODIPINE BESYLATE 2.5 MG PO TABS: 2.5000 mg | ORAL_TABLET | Freq: Every day | ORAL | Status: DC

## 2013-06-09 MED ORDER — FUROSEMIDE 40 MG PO TABS: 20.0000 mg | ORAL_TABLET | Freq: Every day | ORAL | Status: DC

## 2013-06-09 MED ORDER — ASPIRIN 325 MG PO TABS: 325.0000 mg | ORAL_TABLET | Freq: Every day | ORAL | Status: DC

## 2013-06-09 MED ORDER — CARVEDILOL 12.5 MG PO TABS: 12.5000 mg | ORAL_TABLET | Freq: Two times a day (BID) | ORAL | Status: AC

## 2013-06-09 NOTE — Progress Notes (Signed)
Occupational Therapy Treatment Patient Details Name: Daisy [name] MRN: [id number] DOB: Oct 13, 1921 Today's Date: 06/09/2013 Time: [id number] OT Time Calculation (min): 24 min  OT Assessment / Plan / Recommendation  History of present illness Patient is an 80 yo female s/p multiple infarct watershed ischemic CVA.   OT comments  Progressing with therapy but requires max inst. And demonstrational cues for safe task initiation and completion with regards to functional transfers and mobility.  Delayed initiation/processing of one-step commands ie: self care   Follow Up Recommendations  CIR           Equipment Recommendations  None recommended by OT    Recommendations for Other Services Rehab consult  Frequency Min 3X/week   Progress towards OT Goals Progress towards OT goals: Progressing toward goals  Plan Discharge plan remains appropriate    Precautions / Restrictions Precautions Precautions: Fall Restrictions Weight Bearing Restrictions: No   Pertinent Vitals/Pain No complaints    ADL  Grooming: Performed;Wash/dry hands;Set up Where Assessed - Grooming: Supported sitting Lower Body Bathing: Performed;Moderate assistance Where Assessed - Lower Body Bathing: Supine, head of bed up Toilet Transfer: Performed;Moderate assistance Toilet Transfer Method: Stand pivot;Sit to [place] Toilet Transfer Equipment: Comfort height toilet;Grab bars Toileting - Clothing Manipulation and Hygiene: Performed;Maximal assistance Where Assessed - Engineer, miningToileting Clothing Manipulation and Hygiene: Standing Transfers/Ambulation Related to ADLs: pt. moves slow, requires mod cues for completion of transfer prior to sitting down ADL Comments: lb bathing, max a front peri care secondary to bladder incontinence, all components of toileting mod/max a    OT Goals(current goals can now be found in the care plan section)    Visit Information  Last OT Received On: 06/09/13 History of Present Illness: Patient is  an 80 yo female s/p multiple infarct watershed ischemic CVA.                 Cognition  Cognition Arousal/Alertness: Awake/alert Behavior During Therapy: WFL for tasks assessed/performed Overall Cognitive Status: Impaired/Different from baseline Area of Impairment: Orientation;Following commands;Safety/judgement;Problem solving Orientation Level: Disoriented to;Time Current Attention Level: Sustained Memory: Decreased recall of precautions;Decreased short-term memory Following Commands: Follows one step commands inconsistently;Follows one step commands with increased time Safety/Judgement: Decreased awareness of deficits Awareness: Intellectual Problem Solving: Slow processing;Decreased initiation;Difficulty sequencing;Requires verbal cues;Requires tactile cues    Mobility  Bed Mobility General bed mobility comments: able to initiate bed mobility with min hand over hand assistance and inst. cues for hand placement on bed rails to faciliate wtih pulling self to eob with hob elevated.  max demonstrational cues to scoot hips to eob Transfers Overall transfer level: Needs assistance Equipment used: Rolling walker (2 wheeled) Transfers: Sit to/from [employer] CorporationStand;Stand Pivot Transfers Sit to Stand: Min assist;Mod assist Stand pivot transfers: Min assist;Mod assist General transfer comment: continuous cues for completing pivot before sitting down, and using b ues to reach for arm rests              End of Session OT - End of Session Equipment Utilized During Treatment: Rolling walker Activity Tolerance: Patient tolerated treatment well Patient left: in chair;with call bell/phone within reach;with family/visitor present       Robet LeuMorris, Sahian Kerney [place], COTA/L 06/09/2013, 11:27 AM

## 2013-06-09 NOTE — Discharge Summary (Signed)
Physician Discharge Summary  Isabell Bonafede ZOX:[id number] DOB: [birth year] DOA: 06/04/2013  PCP: No primary provider on file.  Admit date: 06/04/2013 Discharge date: 06/09/2013  Time spent: 70 minutes  Recommendations for Outpatient Follow-up:  1. Followup with Dr. Pearlean Brownie of neurology in 2 months. 2. Followup with PCP in 1 week. On followup basic metabolic profile need to be obtained to followup on patient's electrolytes and renal function. Patient's blood pressure also need to be reassessed at that time.  Discharge Diagnoses:  Principal Problem:   CVA (cerebral infarction) Active Problems:   Hypotension, unspecified   Other and unspecified hyperlipidemia   Acute renal failure   Leukocytosis, unspecified   Essential hypertension, benign   Depression   GERD (gastroesophageal reflux disease)   Discharge Condition: Stable and improved  Diet recommendation: Heart healthy  Filed Weights   06/05/13 0030  Weight: 86 kg (189 lb 9.5 oz)    History of present illness:  Pt is 80 yo relatively healthy female with no specific medical problems presented to [facility] ED with main concern of sudden onset right side facial droop that she first noticed 3-4 hours prior to this admission. This has resolved by the time she has arrived to ED. Pt denies similar events in the past, no fevers, chills, no shortness of breath and no chest pain, no abdominal or urinary concerns. Pt denies any specific symptoms prior to this event.  In ED, no focal neurological deficits noted. TRH asked to admit for TIA work up   Hospital Course:  1 acute CVA  Patient WAS ADMITTED WITH SUDDEN ONSET RIGHT-SIDED FACIAL DROOP THAT SHE NOTICED 3-4 HOURS PRIOR TO ADMISSION. PATIENT'S RIGHT-SIDED FACIAL DROOP HAD RESOLVED BY ARRIVAL TO THE EMERGENCY ROOM. PATIENT WAS ADMITTED TO A TELEMETRY FLOOR FOR STROKE WORKUP. CT HEAD DONE WAS NEGATIVE FOR ANY ACUTE ABNORMALITIES. MRI of the head with multifocal ischemic infarcts involving left  frontal, parietal and occipital lobes. Patient also noted to be hypotensive on admission. Carotid Doppler with no significant ICA stenosis per preliminary report. 2-D echo with no source of emboli. Aspirin changed to plavix for secondary stroke prevention initially. Patient was also seen by neurology and followed by the stroke team throughout the hospitalization. Patient was initially transitioned to Plavix. Family stated that patient was not on daily aspirin and what is insistent that patient be transitioned back on Plavix to aspirin as patient had not been on aspirin daily prior to admission. Patient was subsequently changed to aspirin. Discussed with neurology. Patient noted to have elevated LDL and a statin dose was increased and patient be discharged home on Crestor 10 mg daily. Patient will followup with neurology as outpatient. #2 hypotension  Patient noted to be on diuretics and antihypertensive medications prior to admission. Patient is currently afebrile. Urinalysis was negative. Chest x-ray also noted to be negative. Patient was placed on IV fluids and antihypertensive medications held initially. Patient's blood pressure improved she was subsequently started back on half home dose of Coreg as well as Norvasc. Patient's Lasix will be resumed at 20 mg daily on discharge. Patient will need to followup with PCP as outpatient.   #3 acute renal failure  Likely secondary to prerenal azotemia in the setting of ACE inhibitor and diuretics. ACE inhibitor and diuretics on hold. Renal function improving with hydration. Patient's Lasix will be resumed at a lower dose on discharge. Patient is to followup with PCP as outpatient. #4 hyperlipidemia  LDL of 151. Patient was on 5 mg of Crestor prior to  admission and on discharge will  be increased to 10 mg of Crestor. Patient has been started on Lipitor while in house.  #5 leukocytosis  Likely a reactive leukocytosis. Chest x-ray is negative. Urinalysis is negative.  No need for antibiotics at this time. WBC trending down by day of discharge. #6 depression  Resumed on Celexa at 20 mg daily.  #7 hypertension  On admission patient was noted to be hypotensive and a such antihypertensive medications were held. Patient's Norvasc and Coreg have been resumed at half home dose. Lasix will be resumed on discharge at 20 mg daily. #8 gastroesophageal reflux disease  Maintained on PPI.   Procedures: CT head 06/04/2013  MRI/MRA of the head 06/04/2013  2 D Echo 06/05/2013  Carotid Dopplers 06/05/2013     Consultations: Neurology: Dr. Cyril Mourning 06/04/2013   Discharge Exam: Filed Vitals:   06/09/13 1419  BP: 106/50  Pulse: 73  Temp: 98 F (36.7 C)  Resp: 18    General: NAD Cardiovascular: RRR Respiratory: CTAB  Discharge Instructions      Discharge Orders   Future Orders Complete By Expires   Diet - low sodium heart healthy  As directed    Discharge instructions  As directed    Comments:     Follow up with Dr Pearlean Brownie in 2 months. Follow up with MD at SNF.   Increase activity slowly  As directed        Medication List    STOP taking these medications       aspirin EC 325 MG tablet  Replaced by:  aspirin 325 MG tablet     lisinopril 40 MG tablet  Commonly known as:  PRINIVIL,ZESTRIL      TAKE these medications       acetaminophen 500 MG tablet  Commonly known as:  TYLENOL  Take 1,000 mg by mouth every 6 (six) hours as needed for moderate pain.     amLODipine 2.5 MG tablet  Commonly known as:  NORVASC  Take 1 tablet (2.5 mg total) by mouth daily.     aspirin 325 MG tablet  Take 1 tablet (325 mg total) by mouth daily.     carvedilol 12.5 MG tablet  Commonly known as:  COREG  Take 1 tablet (12.5 mg total) by mouth 2 (two) times daily with a meal.     cetirizine 10 MG tablet  Commonly known as:  ZYRTEC  Take 10 mg by mouth every evening.     citalopram 20 MG tablet  Commonly known as:  CELEXA  Take 1 tablet (20 mg total)  by mouth daily.     ferrous sulfate 325 (65 FE) MG tablet  Take 325 mg by mouth daily.     Fluticasone-Salmeterol 250-50 MCG/DOSE Aepb  Commonly known as:  ADVAIR  Inhale 1 puff into the lungs 2 (two) times daily.     furosemide 40 MG tablet  Commonly known as:  LASIX  Take 0.5 tablets (20 mg total) by mouth daily.     hydrocortisone cream 1 %  Apply 1 application topically 2 (two) times daily as needed (rash).     omeprazole 20 MG capsule  Commonly known as:  PRILOSEC  Take 20 mg by mouth daily.     PROAIR HFA 108 (90 BASE) MCG/ACT inhaler  Generic drug:  albuterol  Inhale 1 puff into the lungs every 6 (six) hours as needed. For wheezing/shortness of breath     rosuvastatin 10 MG tablet  Commonly known as:  CRESTOR  Take 1 tablet (10 mg total) by mouth at bedtime.       Allergies  Allergen Reactions  . Aricept [Donepezil Hcl] Other (See Comments)    "has nightmares"  . Codeine     REACTION: Pt. unsure.  Marland Kitchen Ketek [Telithromycin] Other (See Comments)    "fearful state, not herself"   Follow-up Information   Follow up with SETHI,PRAMODKUMAR P, MD. Schedule an appointment as soon as possible for a visit in 2 months.   Specialties:  Neurology, Radiology   Contact information:   [address] [place] [address] [phone]       Please follow up. (f/u with MD at [facility])       Schedule an appointment as soon as possible for a visit in 1 week to follow up. (f/u with PCP in 1 week.)        The results of significant diagnostics from this hospitalization (including imaging, microbiology, ancillary and laboratory) are listed below for reference.    Significant Diagnostic Studies: Dg Chest 2 View  06/04/2013   CLINICAL DATA:  Shortness of breath for 1 day.  EXAM: CHEST  2 VIEW  COMPARISON:  PA and lateral chest 05/20/2013.  FINDINGS: There is linear atelectasis in the lingula. The lungs are otherwise clear. Heart size is mildly enlarged. No pneumothorax or  pleural effusion.  IMPRESSION: No acute disease.  Linear atelectasis in the lingula is noted.   Electronically Signed   By: Drusilla Kanner M.D.   On: 06/04/2013 21:51   Ct Head Wo Contrast  06/04/2013   CLINICAL DATA:  Left-sided weakness. Acute mental status changes. Unable to answer questions. Code stroke.  EXAM: CT HEAD WITHOUT CONTRAST  TECHNIQUE: Contiguous axial images were obtained from the base of the skull through the vertex without intravenous contrast.  COMPARISON:  None.  FINDINGS: Moderate age-appropriate cortical and deep atrophy. Severe changes of small vessel disease of the white matter diffusely, including the pons. Old lacunar strokes in both basal ganglia and both thalami. No mass lesion. No midline shift. No acute hemorrhage or hematoma. No extra-axial fluid collections. No evidence of acute infarction.  No focal osseous abnormality involving the skull. Visualized paranasal sinuses, bilateral mastoid air cells, and bilateral middle ear cavities well-aerated. bilateral carotid siphon and left vertebral artery atherosclerosis.  IMPRESSION: 1. No acute intracranial abnormality. 2. Moderate age-appropriate cortical and deep atrophy and severe chronic microvascular ischemic changes of the white matter. These results were called by telephone at the time of interpretation on 06/04/2013 at 7:54 PM to Dr. Cyril Mourning of the stroke service, who verbally acknowledged these results.   Electronically Signed   By: Hulan Saas M.D.   On: 06/04/2013 19:55   Mr Maxine Glenn Head Wo Contrast  06/05/2013   CLINICAL DATA:  Right-sided facial droop, now resolved  EXAM: MRI HEAD WITHOUT CONTRAST  MRA HEAD WITHOUT CONTRAST  TECHNIQUE: Multiplanar, multiecho pulse sequences of the brain and surrounding structures were obtained without intravenous contrast. Angiographic images of the head were obtained using MRA technique without contrast.  COMPARISON:  Prior CT performed earlier on the same day and  FINDINGS: MRI HEAD  FINDINGS  Advanced age-related atrophy is present. Extensive scattered and confluent T2/FLAIR hyperintensity within the periventricular and deep white matter is compatible with moderate chronic microvascular ischemic changes.  Cervicomedullary junction is within normal limits. Pituitary gland is normal. Asymmetric FLAIR hyperintensity involving the intraconal and extraconal fat of the left orbit is thought to be artifactual  in nature, as similar changes are seen within the adjacent left face.  No mass lesion or midline shift. There is mild ventricular prominence related to generalized cerebral atrophy without evidence of hydrocephalus. No extra-axial fluid collection.  Multiple foci of restricted diffusion are seen involving the cortical gray matter and subcortical white matter of the right frontal, parietal, and occipital lobes (series 5, and image 15 through 25). These foci are predominantly along a watershed distribution involving the left centrum semi ovale. No significant mass effect at this time. No associated hemorrhage seen on gradient echo sequence. No right-sided cerebral infarcts identified. No infratentorial infarct.  A few scattered subcentimeter and T1 hypointense, T2 hyperintense lacunar infarcts are noted within the right thalamus and left basal ganglia.  A few scattered foci of hypo intense signal intensity seen on gradient echo sequence within the right frontal and parietal lobes likely represent chronic micro hemorrhages. The largest of these is located within the right frontal lobe and measures 4 mm (series 10, image 99). Similar foci seen within the right thalamus. Several scattered foci are also noted within the left cerebral hemisphere.  Calvarium demonstrates a normal appearance with normal signal intensity. Visualized upper cervical spine is grossly normal.  Scalp soft tissues within normal limits.  Paranasal sinuses are clear.  Small right mastoid effusion noted.  MRA HEAD FINDINGS  The  visualized portions of the distal cervical internal carotid arteries are within normal limits with widely patent antegrade flow. No high-grade stenosis seen. The cervical segments of the distal internal carotid arteries are tortuous. The petrous, cavernous, and supra clinoid segments of the internal carotid arteries are widely patent without high-grade stenosis or occlusion. The A1 segments are symmetric in caliber with widely patent antegrade flow. Anterior communicating artery and anterior cerebral arteries are within normal limits.  The left M1 segment is widely patent with antegrade flow. No proximal branch occlusion or high-grade flow-limiting stenosis identified. The distal left MCA territory branches are within normal limits.  Mild multi focal irregularity noted within the distal aspect of the right M1 segment without high-grade focal stenosis. A short-segment stenosis of approximately 40% is seen within the proximal right M1 segment (series 703, image 7). The distal right MCA territory branches are normal.  Vertebral arteries are codominant. The posterior inferior cerebellar arteries are widely patent with antegrade flow. Vertebrobasilar junction and basilar artery are within normal limits. No basilar tip stenosis or aneurysm. Posterior cerebellar arteries are widely patent without high-grade stenosis. Question fetal origin of the right PCA. The superior cerebellar arteries and anterior inferior cerebellar arteries are within normal limits.  No intracranial aneurysm identified.  IMPRESSION: MR HEAD:  1. Multi focal ischemic infarcts involving the left frontal, parietal, and occipital lobes as above. These foci are predominantly in a watershed distribution, and may be related to a hypotensive episode. 2. Advanced age related atrophy with chronic microvascular ischemic disease. 3. Scattered subcentimeter hypo intense foci on gradient echo sequence involving both cerebral hemispheres, most consistent with small  chronic micro hemorrhages. Findings may be related to underlying amyloid angiopathy versus chronic hypertensive micro hemorrhages. 4. Small remote lacunar infarcts within the right thalamus and left basal ganglia.  MRA HEAD:  1. No proximal branch occlusion or high-grade flow-limiting stenosis identified. Specifically, no high-grade stenosis identified within the visualized left internal carotid artery or left middle cerebral artery that may contribute to the left cerebral watershed territory infarcts. 2. Short segment mild stenosis of approximately 40% within the proximal right M1 segment. 3.  No intracranial aneurysm.   Electronically Signed   By: Rise Mu M.D.   On: 06/05/2013 00:43   Mr Brain Wo Contrast  06/05/2013   CLINICAL DATA:  Right-sided facial droop, now resolved  EXAM: MRI HEAD WITHOUT CONTRAST  MRA HEAD WITHOUT CONTRAST  TECHNIQUE: Multiplanar, multiecho pulse sequences of the brain and surrounding structures were obtained without intravenous contrast. Angiographic images of the head were obtained using MRA technique without contrast.  COMPARISON:  Prior CT performed earlier on the same day and  FINDINGS: MRI HEAD FINDINGS  Advanced age-related atrophy is present. Extensive scattered and confluent T2/FLAIR hyperintensity within the periventricular and deep white matter is compatible with moderate chronic microvascular ischemic changes.  Cervicomedullary junction is within normal limits. Pituitary gland is normal. Asymmetric FLAIR hyperintensity involving the intraconal and extraconal fat of the left orbit is thought to be artifactual in nature, as similar changes are seen within the adjacent left face.  No mass lesion or midline shift. There is mild ventricular prominence related to generalized cerebral atrophy without evidence of hydrocephalus. No extra-axial fluid collection.  Multiple foci of restricted diffusion are seen involving the cortical gray matter and subcortical white matter  of the right frontal, parietal, and occipital lobes (series 5, and image 15 through 25). These foci are predominantly along a watershed distribution involving the left centrum semi ovale. No significant mass effect at this time. No associated hemorrhage seen on gradient echo sequence. No right-sided cerebral infarcts identified. No infratentorial infarct.  A few scattered subcentimeter and T1 hypointense, T2 hyperintense lacunar infarcts are noted within the right thalamus and left basal ganglia.  A few scattered foci of hypo intense signal intensity seen on gradient echo sequence within the right frontal and parietal lobes likely represent chronic micro hemorrhages. The largest of these is located within the right frontal lobe and measures 4 mm (series 10, image 99). Similar foci seen within the right thalamus. Several scattered foci are also noted within the left cerebral hemisphere.  Calvarium demonstrates a normal appearance with normal signal intensity. Visualized upper cervical spine is grossly normal.  Scalp soft tissues within normal limits.  Paranasal sinuses are clear.  Small right mastoid effusion noted.  MRA HEAD FINDINGS  The visualized portions of the distal cervical internal carotid arteries are within normal limits with widely patent antegrade flow. No high-grade stenosis seen. The cervical segments of the distal internal carotid arteries are tortuous. The petrous, cavernous, and supra clinoid segments of the internal carotid arteries are widely patent without high-grade stenosis or occlusion. The A1 segments are symmetric in caliber with widely patent antegrade flow. Anterior communicating artery and anterior cerebral arteries are within normal limits.  The left M1 segment is widely patent with antegrade flow. No proximal branch occlusion or high-grade flow-limiting stenosis identified. The distal left MCA territory branches are within normal limits.  Mild multi focal irregularity noted within the  distal aspect of the right M1 segment without high-grade focal stenosis. A short-segment stenosis of approximately 40% is seen within the proximal right M1 segment (series 703, image 7). The distal right MCA territory branches are normal.  Vertebral arteries are codominant. The posterior inferior cerebellar arteries are widely patent with antegrade flow. Vertebrobasilar junction and basilar artery are within normal limits. No basilar tip stenosis or aneurysm. Posterior cerebellar arteries are widely patent without high-grade stenosis. Question fetal origin of the right PCA. The superior cerebellar arteries and anterior inferior cerebellar arteries are within normal limits.  No intracranial aneurysm  identified.  IMPRESSION: MR HEAD:  1. Multi focal ischemic infarcts involving the left frontal, parietal, and occipital lobes as above. These foci are predominantly in a watershed distribution, and may be related to a hypotensive episode. 2. Advanced age related atrophy with chronic microvascular ischemic disease. 3. Scattered subcentimeter hypo intense foci on gradient echo sequence involving both cerebral hemispheres, most consistent with small chronic micro hemorrhages. Findings may be related to underlying amyloid angiopathy versus chronic hypertensive micro hemorrhages. 4. Small remote lacunar infarcts within the right thalamus and left basal ganglia.  MRA HEAD:  1. No proximal branch occlusion or high-grade flow-limiting stenosis identified. Specifically, no high-grade stenosis identified within the visualized left internal carotid artery or left middle cerebral artery that may contribute to the left cerebral watershed territory infarcts. 2. Short segment mild stenosis of approximately 40% within the proximal right M1 segment. 3. No intracranial aneurysm.   Electronically Signed   By: Rise Mu M.D.   On: 06/05/2013 00:43    Microbiology: No results found for this or any previous visit (from the past  240 hour(s)).   Labs: Basic Metabolic Panel:  Recent Labs Lab 06/05/13 0350 06/06/13 1102 06/07/13 0605 06/08/13 0413 06/09/13 0611  NA 140 141 142 139 141  K 4.4 4.2 4.0 4.5 4.3  CL 96 103 102 101 101  CO2 28 30 29 29 30   GLUCOSE 216* 86 87 106* 95  BUN 29* 26* 18 23 22   CREATININE 1.22* 0.99 0.91 1.21* 1.06  CALCIUM 8.5 7.9* 8.3* 8.3* 8.5   Liver Function Tests: No results found for this basename: AST, ALT, ALKPHOS, BILITOT, PROT, ALBUMIN,  in the last 168 hours No results found for this basename: LIPASE, AMYLASE,  in the last 168 hours No results found for this basename: AMMONIA,  in the last 168 hours CBC:  Recent Labs Lab 06/04/13 1931 06/05/13 0350  WBC 11.1* 10.5  HGB 13.2 12.8  HCT 40.2 39.3  MCV 95.7 95.2  PLT 194 195   Cardiac Enzymes:  Recent Labs Lab 06/04/13 1931  TROPONINI <0.30   BNP: BNP (last 3 results) No results found for this basename: PROBNP,  in the last 8760 hours CBG:  Recent Labs Lab 06/04/13 1953  GLUCAP 113*       Signed:  Mireya Meditz MD [facility] 06/09/2013, 4:03 PM

## 2013-06-09 NOTE — Progress Notes (Addendum)
Rehab admissions - We have received a denial from [facility] for request for acute inpatient rehab admission.  Patient approved for SNF.  I will discuss with patient.  Call me for questions.  [phone]  I spoke with patient's daughter and explained the denial.  Daughter [place] in SNF in [place] and her first choice is [facility].  #[phone]

## 2013-06-09 NOTE — Clinical Social Work Note (Signed)
CSW met with pt and pt's daughters at bedside to discuss pt's discharge to SNF. Pt was hesitant about being discharged to SNF. After speaking with MD, PT, and CSW pt agreed to be discharged to SNF. Pt and pt's family have chosen [facility] SNF for placement. CSW and [facility] received insurance authorization for pt to be placed at [facility]. Pt is to be transported via ambulance. CSW has sent discharge information (summary, AVS, and hardscripts) to [facility]. CSW will complete discharge packet and place on pt's shadow chart.   RN to please call [facility] RN, Cecil Cranker at [phone]  Ambulance ([place]) [place], [place] Worker [phone]

## 2013-06-09 NOTE — Progress Notes (Signed)
Report given to Danne HarborIreka, RN from [facility].

## 2013-06-16 LAB — BASIC METABOLIC PANEL
Anion Gap: 2 — ABNORMAL LOW (ref 7–16)
BUN: 15 mg/dL (ref 7–18)
CHLORIDE: 100 mmol/L (ref 98–107)
CREATININE: 0.99 mg/dL (ref 0.60–1.30)
Calcium, Total: 8.6 mg/dL (ref 8.5–10.1)
Co2: 34 mmol/L — ABNORMAL HIGH (ref 21–32)
EGFR (African American): 59 — ABNORMAL LOW
GFR CALC NON AF AMER: 51 — AB
Glucose: 92 mg/dL (ref 65–99)
Osmolality: 272 (ref 275–301)
Potassium: 3.7 mmol/L (ref 3.5–5.1)
SODIUM: 136 mmol/L (ref 136–145)

## 2013-06-21 ENCOUNTER — Encounter: Payer: Self-pay | Admitting: Internal Medicine

## 2013-06-29 LAB — URINALYSIS, COMPLETE
BILIRUBIN, UR: NEGATIVE
Blood: NEGATIVE
GLUCOSE, UR: NEGATIVE mg/dL (ref 0–75)
KETONE: NEGATIVE
Nitrite: NEGATIVE
Ph: 5 (ref 4.5–8.0)
Protein: NEGATIVE
RBC,UR: 1 /HPF (ref 0–5)
Specific Gravity: 1.011 (ref 1.003–1.030)
Squamous Epithelial: 3
WBC UR: 184 /HPF (ref 0–5)

## 2013-07-01 LAB — URINE CULTURE

## 2013-07-19 ENCOUNTER — Encounter: Payer: Self-pay | Admitting: Internal Medicine

## 2013-08-21 ENCOUNTER — Emergency Department: Payer: Self-pay | Admitting: Emergency Medicine

## 2013-08-21 LAB — COMPREHENSIVE METABOLIC PANEL
ANION GAP: 4 — AB (ref 7–16)
AST: 13 U/L — AB (ref 15–37)
Albumin: 3.5 g/dL (ref 3.4–5.0)
Alkaline Phosphatase: 64 U/L
BUN: 15 mg/dL (ref 7–18)
Bilirubin,Total: 0.6 mg/dL (ref 0.2–1.0)
CHLORIDE: 101 mmol/L (ref 98–107)
CO2: 35 mmol/L — AB (ref 21–32)
CREATININE: 1.06 mg/dL (ref 0.60–1.30)
Calcium, Total: 8.9 mg/dL (ref 8.5–10.1)
GFR CALC AF AMER: 54 — AB
GFR CALC NON AF AMER: 47 — AB
Glucose: 101 mg/dL — ABNORMAL HIGH (ref 65–99)
OSMOLALITY: 280 (ref 275–301)
Potassium: 4 mmol/L (ref 3.5–5.1)
SGPT (ALT): 13 U/L (ref 12–78)
Sodium: 140 mmol/L (ref 136–145)
Total Protein: 6.9 g/dL (ref 6.4–8.2)

## 2013-08-21 LAB — CBC
HCT: 38.4 % (ref 35.0–47.0)
HGB: 12.6 g/dL (ref 12.0–16.0)
MCH: 31.1 pg (ref 26.0–34.0)
MCHC: 32.8 g/dL (ref 32.0–36.0)
MCV: 95 fL (ref 80–100)
Platelet: 192 10*3/uL (ref 150–440)
RBC: 4.05 10*6/uL (ref 3.80–5.20)
RDW: 14 % (ref 11.5–14.5)
WBC: 9.5 10*3/uL (ref 3.6–11.0)

## 2013-08-21 LAB — TROPONIN I: Troponin-I: <0.02 ng/mL

## 2013-08-21 LAB — CK TOTAL AND CKMB (NOT AT ARMC)
CK, Total: 45 U/L
CK-MB: 0.7 ng/mL (ref 0.5–3.6)

## 2013-09-03 ENCOUNTER — Ambulatory Visit: Payer: Self-pay | Admitting: Neurology

## 2013-09-11 ENCOUNTER — Emergency Department: Payer: Self-pay | Admitting: Emergency Medicine

## 2013-09-11 LAB — CBC
HCT: 35.7 % (ref 35.0–47.0)
HGB: 11.5 g/dL — AB (ref 12.0–16.0)
MCH: 30.8 pg (ref 26.0–34.0)
MCHC: 32.3 g/dL (ref 32.0–36.0)
MCV: 95 fL (ref 80–100)
PLATELETS: 211 10*3/uL (ref 150–440)
RBC: 3.75 10*6/uL — ABNORMAL LOW (ref 3.80–5.20)
RDW: 14.3 % (ref 11.5–14.5)
WBC: 7.5 10*3/uL (ref 3.6–11.0)

## 2013-09-11 LAB — COMPREHENSIVE METABOLIC PANEL
ANION GAP: 3 — AB (ref 7–16)
Albumin: 3 g/dL — ABNORMAL LOW (ref 3.4–5.0)
Alkaline Phosphatase: 63 U/L
BUN: 17 mg/dL (ref 7–18)
Bilirubin,Total: 0.4 mg/dL (ref 0.2–1.0)
Calcium, Total: 8.9 mg/dL (ref 8.5–10.1)
Chloride: 102 mmol/L (ref 98–107)
Co2: 34 mmol/L — ABNORMAL HIGH (ref 21–32)
Creatinine: 1.09 mg/dL (ref 0.60–1.30)
EGFR (Non-African Amer.): 45 — ABNORMAL LOW
GFR CALC AF AMER: 52 — AB
Glucose: 86 mg/dL (ref 65–99)
Osmolality: 278 (ref 275–301)
POTASSIUM: 4 mmol/L (ref 3.5–5.1)
SGOT(AST): 10 U/L — ABNORMAL LOW (ref 15–37)
SGPT (ALT): 11 U/L — ABNORMAL LOW (ref 12–78)
Sodium: 139 mmol/L (ref 136–145)
Total Protein: 6.7 g/dL (ref 6.4–8.2)

## 2013-09-11 LAB — URINALYSIS, COMPLETE
BILIRUBIN, UR: NEGATIVE
Bacteria: NEGATIVE
Blood: NEGATIVE
GLUCOSE, UR: NEGATIVE mg/dL (ref 0–75)
Ketone: NEGATIVE
NITRITE: NEGATIVE
PH: 5 (ref 4.5–8.0)
Protein: NEGATIVE
RBC, UR: NONE SEEN /HPF (ref 0–5)
Specific Gravity: 1.01 (ref 1.003–1.030)

## 2013-09-11 LAB — PRO B NATRIURETIC PEPTIDE: B-Type Natriuretic Peptide: 526 pg/mL — ABNORMAL HIGH (ref 0–450)

## 2013-09-11 LAB — CK TOTAL AND CKMB (NOT AT ARMC)
CK, TOTAL: 28 U/L
CK-MB: 0.7 ng/mL (ref 0.5–3.6)

## 2013-09-11 LAB — TROPONIN I

## 2013-09-16 LAB — CULTURE, BLOOD (SINGLE)

## 2014-02-08 ENCOUNTER — Inpatient Hospital Stay: Payer: Self-pay | Admitting: Internal Medicine

## 2014-02-08 LAB — BASIC METABOLIC PANEL
Anion Gap: 10 (ref 7–16)
BUN: 15 mg/dL (ref 7–18)
CHLORIDE: 101 mmol/L (ref 98–107)
Calcium, Total: 8.4 mg/dL — ABNORMAL LOW (ref 8.5–10.1)
Co2: 27 mmol/L (ref 21–32)
Creatinine: 1.33 mg/dL — ABNORMAL HIGH (ref 0.60–1.30)
EGFR (African American): 41 — ABNORMAL LOW
EGFR (Non-African Amer.): 35 — ABNORMAL LOW
GLUCOSE: 134 mg/dL — AB (ref 65–99)
Osmolality: 278 (ref 275–301)
POTASSIUM: 3.5 mmol/L (ref 3.5–5.1)
Sodium: 138 mmol/L (ref 136–145)

## 2014-02-08 LAB — CBC
HCT: 37.8 % (ref 35.0–47.0)
HGB: 12.4 g/dL (ref 12.0–16.0)
MCH: 30.7 pg (ref 26.0–34.0)
MCHC: 32.7 g/dL (ref 32.0–36.0)
MCV: 94 fL (ref 80–100)
Platelet: 208 10*3/uL (ref 150–440)
RBC: 4.02 10*6/uL (ref 3.80–5.20)
RDW: 14.3 % (ref 11.5–14.5)
WBC: 12.5 10*3/uL — AB (ref 3.6–11.0)

## 2014-02-08 LAB — URINALYSIS, COMPLETE
BILIRUBIN, UR: NEGATIVE
Glucose,UR: NEGATIVE mg/dL (ref 0–75)
KETONE: NEGATIVE
Nitrite: POSITIVE
Ph: 5 (ref 4.5–8.0)
Protein: 75
RBC,UR: 197 /HPF (ref 0–5)
SPECIFIC GRAVITY: 1.005 (ref 1.003–1.030)

## 2014-02-08 LAB — PRO B NATRIURETIC PEPTIDE: B-Type Natriuretic Peptide: 2361 pg/mL — ABNORMAL HIGH (ref 0–450)

## 2014-02-08 LAB — TROPONIN I: Troponin-I: <0.02 ng/mL

## 2014-02-08 LAB — RAPID INFLUENZA A&B ANTIGENS (ARMC ONLY)

## 2014-02-09 LAB — CBC WITH DIFFERENTIAL/PLATELET
BASOS ABS: 0 10*3/uL (ref 0.0–0.1)
BASOS PCT: 0 %
EOS ABS: 0 10*3/uL (ref 0.0–0.7)
EOS PCT: 0.1 %
HCT: 38.8 % (ref 35.0–47.0)
HGB: 12 g/dL (ref 12.0–16.0)
LYMPHS PCT: 3.4 %
Lymphocyte #: 0.5 10*3/uL — ABNORMAL LOW (ref 1.0–3.6)
MCH: 29.9 pg (ref 26.0–34.0)
MCHC: 30.9 g/dL — ABNORMAL LOW (ref 32.0–36.0)
MCV: 97 fL (ref 80–100)
Monocyte #: 0.5 x10 3/mm (ref 0.2–0.9)
Monocyte %: 3 %
NEUTROS PCT: 93.5 %
Neutrophil #: 14.8 10*3/uL — ABNORMAL HIGH (ref 1.4–6.5)
Platelet: 215 10*3/uL (ref 150–440)
RBC: 4 10*6/uL (ref 3.80–5.20)
RDW: 14.6 % — ABNORMAL HIGH (ref 11.5–14.5)
WBC: 15.8 10*3/uL — AB (ref 3.6–11.0)

## 2014-02-09 LAB — BASIC METABOLIC PANEL
Anion Gap: 5 — ABNORMAL LOW (ref 7–16)
BUN: 21 mg/dL — ABNORMAL HIGH (ref 7–18)
CALCIUM: 8.4 mg/dL — AB (ref 8.5–10.1)
CO2: 34 mmol/L — AB (ref 21–32)
Chloride: 101 mmol/L (ref 98–107)
Creatinine: 1.91 mg/dL — ABNORMAL HIGH (ref 0.60–1.30)
EGFR (African American): 26 — ABNORMAL LOW
GFR CALC NON AF AMER: 23 — AB
GLUCOSE: 181 mg/dL — AB (ref 65–99)
OSMOLALITY: 287 (ref 275–301)
POTASSIUM: 3.6 mmol/L (ref 3.5–5.1)
Sodium: 140 mmol/L (ref 136–145)

## 2014-02-10 LAB — CREATININE, SERUM
CREATININE: 1.64 mg/dL — AB (ref 0.60–1.30)
EGFR (African American): 32 — ABNORMAL LOW
EGFR (Non-African Amer.): 27 — ABNORMAL LOW

## 2014-02-10 LAB — URINE CULTURE

## 2014-02-11 LAB — BASIC METABOLIC PANEL
ANION GAP: 3 — AB (ref 7–16)
BUN: 25 mg/dL — ABNORMAL HIGH (ref 7–18)
CHLORIDE: 101 mmol/L (ref 98–107)
CO2: 35 mmol/L — AB (ref 21–32)
CREATININE: 1.55 mg/dL — AB (ref 0.60–1.30)
Calcium, Total: 8 mg/dL — ABNORMAL LOW (ref 8.5–10.1)
EGFR (Non-African Amer.): 33 — ABNORMAL LOW
GFR CALC AF AMER: 40 — AB
GLUCOSE: 97 mg/dL (ref 65–99)
Osmolality: 282 (ref 275–301)
Potassium: 4.1 mmol/L (ref 3.5–5.1)
Sodium: 139 mmol/L (ref 136–145)

## 2014-02-13 LAB — CULTURE, BLOOD (SINGLE)

## 2014-09-10 NOTE — Consult Note (Signed)
Brief Consult Note: Diagnosis: CHF, probable diastolic with hyponatremia and ascute bronchitis.   Patient was seen by consultant.   Consult note dictated.   Comments: REC  Agree with current therpay, cont diuresis, if hyponatremia worsend may need to convert from furosemide to tolvaptan, review echo.  Electronic Signatures: Marcina MillardParaschos, Santana Edell (MD)  (Signed 31-May-14 12:27)  Authored: Brief Consult Note   Last Updated: 31-May-14 12:27 by Marcina MillardParaschos, Amir Fick (MD)

## 2014-09-10 NOTE — H&P (Signed)
PATIENT NAME:  Daisy [name], Daisy [name] MR#:  [id number] DATE OF BIRTH:  November 26, 1921  DATE OF ADMISSION:  10/18/2012  PRIMARY CARE PHYSICIAN: Reola MosherAndrew S. Randa LynnLamb, MD   REFERRING PHYSICIAN: Doralee AlbinoLinda M. Ladona Ridgelaylor, MD   CHIEF COMPLAINT: Shortness of breath, wheezing, cough.   HISTORY OF PRESENT ILLNESS: Daisy [name] is an 81 year old pleasant white female with past medical history of hypertension, hyperlipidemia, coronary artery disease and COPD, who comes to the Emergency Department with complaints of cold, cough and wheezing for the last 1 week. It started as a chest congestion, continued to have cough with mild productive sputum. Concerning this, the patient was taken to the primary care physician, who gave her 2 rounds of antibiotics, initially with Zithromax, followed by Levaquin as the patient did not show much improvement. The patient was also given prednisone and breathing treatments without much improvement. The patient has been experiencing severe shortness of breath with mild exertion, also expediences PND, orthopnea. As the patient's symptoms were getting worse, with audible wheezing, family was concerned and was taken to the urgent care center, where portable x-ray was obtained. X-ray showed significant infiltrate on the left lower lobe. Concerning this, the patient was sent to the Emergency Department. In the Emergency Department, CT was done which showed negative for any PE, does not show any obvious infiltrates. The patient had elevated BNP of 3000. Denied having any chest pain, palpitations. The patient received 1 dose of Lasix with good diuresis of greater than 1 liter of urine output in the last 2 hours. The patient was also experiencing subjective fevers. At baseline, the patient has poor functional status; however, has good family support from the family.   PAST MEDICAL HISTORY:  1. Mild dementia.  2. Depression.  3. Osteoarthritis.  4. Hypertension.  5. Coronary artery disease. Per the patient, did not  have any heart cath in the past.  6. Hyperlipidemia.  7. Urinary incontinence. 8. Bladder prolapse.    PAST SURGICAL HISTORY:  1. Right knee surgery.  2. Total abdominal hysterectomy with bilateral salpingo-oophorectomy.  3. Bladder tack x3.   ALLERGIES: TO CODEINE AND ARICEPT.   HOME MEDICATIONS:  1. Ventolin 90 mcg 2 puffs 4 times a day.  2. Propranolol 120 mg once a day.  3. Prednisone 20 mg as needed.  4. Omeprazole 20 mg 2 times a day.  5.  25 mg once a day.  6. Losartan 100 mg once a day.  7. Lisinopril 40 mg once a day. 8. Levofloxacin 500 mg daily.  9. Hydrochlorothiazide 12.5 mg once a day.  10. Lasix 40 mg once a day.  11. Crestor 5 mg once a day.  12. Citalopram 50 mg once a day.  13. Bacitracin topical cream as needed.  14. Advair Diskus 1 puff 2 times a day.   SOCIAL HISTORY: Remote history of smoking. Denies drinking alcohol or using illicit drugs. Lives in [place] by herself with the help of the family members.   REVIEW OF SYSTEMS:  CONSTITUTIONAL: Subjective fevers, generalized weakness.  EYES: No change in vision.  ENT: No sore throat. No change in hearing.  RESPIRATORY: Has cough, shortness of breath.  CARDIOVASCULAR: No chest pain, palpitations, shortness of breath, PND, orthopnea.  ENDOCRINE: No polyuria or polydipsia.  GENITOURINARY: No dysuria or hematuria. Has urinary incontinence.  SKIN: No rash or lesions.  NEUROLOGIC: No weakness or numbness.  PSYCHIATRIC: History of depression and anxiety.   PHYSICAL EXAMINATION:  GENERAL: This is a well-built, well-nourished, age-appropriate, very pleasant female  lying down in the bed, not in distress.  VITAL SIGNS: Temperature 98.5, pulse 71, blood pressure 190/64, respiratory rate of 24, oxygen saturation is 98% on 2 liters of oxygen.  HEENT: Head normocephalic, atraumatic. Eyes: No scleral icterus. Conjunctivae normal. Pupils equal and reactive to light. Extraocular movements are intact. Mucous membranes  moist. No pharyngeal erythema.  NECK: Supple. No lymphadenopathy. No JVD. No carotid bruit. No thyromegaly.  CHEST: Has no focal tenderness. Bilateral diffuse wheezing. Crackles are heard up to midchest.  HEART: S1, S2 regular. No murmurs are heard.   LOWER EXTREMITIES: Have 2 to 3+ pitting edema extending up to the knees.  SKIN: No rash or lesions.  MUSCULOSKELETAL: Good range of motion in all the extremities.  LYMPHATIC: No axillary or inguinal lymphadenopathy.  NEUROLOGIC: The patient is alert and oriented to place, person and time. Cranial nerves II through XII intact. Motor 5/5 in upper and lower extremities. No sensory deficits.   LABORATORY DATA: UA negative for nitrites and leukocyte esterase. CTA shows no pulmonary embolism. No thoracic aortic aneurysm. Minimal dependent atelectasis. Troponin less than 0.2 with CK-MB of 1.5. ABG is well within normal limits. LFTs are completely within normal limits. BNP 3200. Sodium of 125, the rest of all the values are within normal limits. CBC: WBC of 8.7, hemoglobin 9.7, platelet count of 279. EKG, 12-lead: Normal sinus rhythm with no ST-[name] wave abnormalities.   ASSESSMENT AND PLAN: Daisy [name] is an 81 year old female who is brought to the Emergency Department with cough, shortness of breath, not improving with outpatient treatment.   1. Congestive heart failure, new onset, most likely diastolic; however, the patient does not have any echocardiogram on the records. Will obtain echocardiogram. Admit the patient to the monitored bed. Continue with IV Lasix b.i.d. Strict I's and O's, daily weights. Also consider consulting cardiology. Cardiology consult has been placed. The patient is on lisinopril, also on propranolol. Will hold the propranolol and start the patient on Coreg.  2. Chronic obstructive pulmonary disease exacerbation. Underlying pneumonia might have precipitated chronic obstructive pulmonary disease exacerbation. Continue with the breathing  treatments, Solu-Medrol and continue with Levaquin.  3. Hyponatremia secondary to underlying lung process. Treat the underlying chronic obstructive pulmonary disease exacerbation and pneumonia and follow up as well as good diuresis. Urine sodium may not be accurate as the patient already received Lasix.  4. Hypertensive urgency. Continue with IV Lasix and follow up. Will also keep the patient on labetalol as needed.  5. Debility. Will involve the physical therapy, occupational therapy.  6. Keep the patient on deep vein thrombosis prophylaxis with Lovenox.   TIME SPENT: 45 minutes.   ____________________________ Susa Griffins, MD pv:OSi D: 10/18/2012 07:42:43 ET [name]: 10/18/2012 08:46:35 ET JOB#: [id number]  cc: Susa Griffins, MD, <Dictator> Reola Mosher. Randa Lynn, MD Clerance Lav Fonda Rochon MD ELECTRONICALLY SIGNED 10/20/2012 0:52

## 2014-09-10 NOTE — Discharge Summary (Signed)
PATIENT NAME:  Daisy [name], Daisy [name] MR#:  [id number] DATE OF BIRTH:  [birth year]  DATE OF ADMISSION:  10/18/2012 DATE OF DISCHARGE:  10/24/2012  DISCHARGE DIAGNOSES: 1.  Chronic obstructive pulmonary disease exacerbation.  2.  Acute diastolic congestive heart failure.  3.  Hyponatremia.  4.  Acute renal failure.  5.  Malignant hypertension.   CONDITION ON DISCHARGE: Stable.   CODE STATUS: Full code.   MEDICATIONS ON DISCHARGE:  1.  Citalopram 30 mg once a day.  2.  Advair 1 puff 2 times a day.  3.  Bacitracin topical, apply to affected area 4 times a day.  4.  Prednisone 10 mg oral tablet, start at 60 mg and taper x 10 mg daily until complete.  5.  Lisinopril 40 mg oral tablet 2 times a day for high blood pressure.  6.  Crestor 5 mg oral tablet once a day.  7.  Carvedilol 25 mg oral tablet 2 times a day for high blood pressure.  8.  Amlodipine 10 mg once a day.  9.  Ferrous sulfate 325 mg 2 times a day.  10.  Docusate sodium 100 mg oral capsule 2 times a day for constipation.  11.  Omeprazole 20 mg 2 times a day for acidity.  12.  Levofloxacin 250 mg oral tablet once a day for 2 days.  13.  Hydralazine 10 mg oral tablet 3 times a day for high blood pressure.   DIET ON DISCHARGE: Regular. Diet supplement: Ensure. Diet supplement frequency: Once a day. Consistency: Regular.   ACTIVITY LIMITATION: As tolerated.   TIMEFRAME TO FOLLOWUP: Within 1 to 2 weeks. Follow with primary care within 1 to 2 weeks to check kidney function and sodium level with Dr. Alonna Buckler, [facility], [place], [place].   HISTORY OF PRESENT ILLNESS: The patient is an 81 year old pleasant white female with past medical history of hypertension, hyperlipidemia, coronary artery disease and COPD, presented to the Emergency Department with complaint of cold, cough and wheezing for 1 week. Primary care physician gave her 2 rounds of antibiotics, initially with Zithromax followed by Levaquin. The patient did not  show any improvement. Was also given prednisone and breathing treatments without much improvement and so she decided to come to the Emergency Room. They took her initially to urgent care center. X-ray was done. It showed significant infiltrate in left lower lobe and so she was sent to ED. CT scan was done and showed negative for any pulmonary embolism and did not show any obvious infiltrate also. The patient had elevated BNP of 3000. Denied any chest pain or palpitation. The patient was admitted for further management.   HOSPITAL COURSE AND STAY:  1.  COPD exacerbation and patient also had some mild pneumonia in region of lingula: We continued steroid, nebulizer, Levaquin oral. The patient started feeling better with treatment of 3 to 4 days and due to global weakness, the patient was suggested to have home health aide at home. 2.  Pneumonia: It was bacterial pneumonia. Not much improvement on Rocephin, with Levaquin showed some improvement. The patient already had Levaquin and azithromycin as outpatient, but maybe after all these antibiotics, the patient started improving. Blood cultures were negative.  3.  CHF, acute diastolic failure: She was off Lasix. No acute coronary syndrome. No ACE inhibitor due to mild renal insufficiency.  4.  Malignant hypertension on presentation: We continued medication for blood pressure control and blood pressure remained stable. We gradually adjusted the blood pressure medication to  control it nicely.  5.  Renal insufficiency: Initially the patient had worsening of the renal function with Lasix, and after holding Lasix, her creatinine got better and patient remained asymptomatic.  6.  Hyponatremia: Likely due to overdiuresis. Diet changes to regular and the patient remained stable and sodium level came up slowly.  7.  Hypokalemia: Supplemented IV. Magnesium was also checked and potassium level came and remained normal.  8.  Iron deficiency anemia: Started on oral iron  supplementation and advised to have at home after discharge.   CONSULT IN THE HOSPITAL: With Dr. Marcina MillardAlexander Paraschos for cardiology.  IMPORTANT LABORATORY RESULTS IN THE HOSPITAL: WBC 8.7, hemoglobin 9.7, platelet count 279. BUN 20 and creatinine 1.07 on presentation, sodium 125. BNP was 3229. Troponin was less than 0.02. On ABG, pH was 7.47, pCO2 was 46 and pO2 was 138 on 28% oxygen supplementation. Blood culture was negative. Urinalysis was grossly negative. CT chest for pulmonary embolism was negative. Troponin remained negative, less than 0.02. Echocardiogram showed left ventricular ejection fraction 50% to 55%, normal global left ventricular systolic function, impaired relaxation pattern of LV diastolic filling, moderate left ventricular hypertrophy, moderately dilated left atrium, moderate mitral valve regurgitation, mild to moderate tricuspid regurgitation. TSH was 0.378. Free thyroxine was 1.22. Creatinine went up to 1.43 on followup and sodium level 130. On further followup, creatinine went up to 1.45 and sodium dropped to 127. After stopping Lasix, creatinine started trending down and came to 0.96 and 0.83 later on. Sodium level stayed at 128 and 126 level.   TOTAL TIME SPENT ON THIS DISCHARGE: 45 minutes.    ____________________________ Hope PigeonVaibhavkumar G. Elisabeth PigeonVachhani, MD vgv:jm D: 10/28/2012 14:11:07 ET T: 10/28/2012 16:25:33 ET JOB#: [id number]  cc: Hope PigeonVaibhavkumar G. Elisabeth PigeonVachhani, MD, <Dictator> Reola MosherAndrew S. Randa LynnLamb, MD Altamese DillingVAIBHAVKUMAR Havoc Sanluis MD ELECTRONICALLY SIGNED 11/03/2012 22:12

## 2014-09-10 NOTE — Consult Note (Signed)
PATIENT NAME:  Daisy [name], Daisy [name] MR#:  [id number] DATE OF BIRTH:  03-Apr-1922  DATE OF CONSULTATION:  10/18/2012  REFERRING PHYSICIAN:   CONSULTING PHYSICIAN:  Marcina Millard, MD  PRIMARY CARE PHYSICIAN:  Alonna Buckler, MD   CARDIOLOGIST:  Dorothyann Peng, MD.   CHIEF COMPLAINT:  Shortness of breath.   REASON FOR CONSULTATION:  Consultation requested for evaluation for possible congestive heart failure.   HISTORY OF PRESENT ILLNESS:  The patient is an 81 year old female with questionable history of coronary artery disease, hypertension, hyperlipidemia and COPD. The patient has had a 1- to 2-week history of shortness of breath, productive cough and wheezing. The patient has been treated as an outpatient for acute bronchitis with Zithromax as well as Levaquin and prednisone. The patient continued to experience symptoms of shortness of breath, cough and wheezing and presented to Urgent Care where chest x-ray showed possible left lower lobe infiltrate, and the patient was sent to [facility] Emergency Room. CT scan was negative for PE. BNP was elevated to 3000, and the patient is admitted with COPD flare and congestive heart failure felt to be due to diastolic dysfunction with elevated blood pressure. The EKG was nondiagnostic. Troponin is negative to less than 0.02, and the patient denies chest pain.   PAST MEDICAL HISTORY:  1.  Questionable history of coronary artery disease.  2.  Hypertension.  3.  Obesity.  4.  Hyperlipidemia.  5.  Mild dementia.  6.  Depression.  7.  Osteoarthritis.   MEDICATIONS ON ADMISSION:  1.  Propranolol 120 mg daily.  2.  Losartan 100 mg daily.  3.  Lisinopril 40 mg daily.  4.  Hydrochlorothiazide 12.5 mg daily.  5.  Furosemide 40 mg daily.  6.  Crestor 5 mg daily.  7.  Ventolin 90 mcg inhaler 2 puffs q.i.d.  8.  Prednisone 20 mg p.r.n.  9.  Omeprazole 20 mg b.i.d.  10.  Levofloxacin 500 mg daily.  11.  Citalopram 50 mg daily. 12.  Advair Diskus 1 puff b.i.d.    SOCIAL HISTORY:  The patient currently lives with her daughter. She has a remote tobacco abuse history.   FAMILY HISTORY:  No immediate family history of coronary disease or myocardial infarction.   REVIEW OF SYSTEMS: CONSTITUTIONAL:  No fever or chills.  EYES:  No blurry vision.  EARS:  No hearing loss.  RESPIRATORY:  Shortness of breath, cough and wheezing as described above.  CARDIOVASCULAR:  The patient denies chest pain. She does have pedal edema.  GASTROINTESTINAL:  No nausea, vomiting, or diarrhea.  GENITOURINARY:  No dysuria or hematuria.  ENDOCRINE:  No polyuria or polydipsia.  INTEGUMENTARY:  No rash.  HEMATOLOGICAL:  No easy bruising or bleeding.  MUSCULOSKELETAL:  No arthralgias or myalgias.  NEUROLOGICAL:  No focal muscle weakness or numbness.  PSYCHOLOGICAL:  No depression or anxiety.   PHYSICAL EXAMINATION:  VITAL SIGNS:  Blood pressure 97/60, pulse 66, respirations 68, temperature 97.6, pulse ox 98%.  HEENT:  Pupils equal, reactive to light and accommodation.  NECK:  Supple without thyromegaly.  LUNGS:  Coarse rhonchi and expiratory wheezes.  CARDIOVASCULAR:  Normal JVP. Normal PMI. Regular rate and rhythm. Normal S1, S2. No appreciable gallop, murmur, or rub.  ABDOMEN:  Soft and nontender.  EXTREMITIES:  There was 1 to 2+ bilateral pedal edema.  MUSCULOSKELETAL:  Normal muscle tone.  NEUROLOGIC:  The patient is alert and oriented x 3. Motor and sensory are both grossly intact.   IMPRESSION:  An 81 year old female  who presents with shortness of breath, cough, wheezing, likely multifactorial with acute bronchitis with concomitant congestive heart failure, probable diastolic in light of elevated blood pressure and comorbidities.   RECOMMENDATIONS:  1.  Agree with overall current therapy.  2.  Would continue diuresis.  3.  Carefully monitor sodium level. The patient was hyponatremic upon presentation.  4.  Consider switching to tolvaptan depending on patient's  response to furosemide.  5.  Review 2-D echocardiogram. 6.  Continue aggressive therapy for acute bronchitis.  ____________________________ Marcina MillardAlexander Mahi Zabriskie, MD ap:jm D: 10/18/2012 12:22:34 ET T: 10/18/2012 16:36:27 ET JOB#: [id number]  cc: Marcina MillardAlexander Rosie Torrez, MD, <Dictator> Marcina MillardALEXANDER Lonzo Saulter MD ELECTRONICALLY SIGNED 10/27/2012 8:49

## 2014-09-11 NOTE — Discharge Summary (Signed)
Dates of Admission and Diagnosis:  Date of Admission 08-Feb-2014   Date of Discharge 11-Feb-2014   Admitting Diagnosis Fever- Urinary tract infection, Bronchitis   Final Diagnosis Urinary tract Infection Acute Diastolic Heart failure COPD Hypertension Coronary Artery Disease    Chief Complaint/History of Present Illness A 81 year old female with history of CVA, CHF, COPD, coronary artery disease, hyperlipidemia, urinary incontinence, hypertension, and mild dementia who lives with her daughter and is very weak at baseline so sometimes has to use a wheelchair, has a lifting chair in house. As per daughter who lives with her, the patient is feeling more weak and says that she feels awful for the last 2 days. Daughter states that she noticed the patient had some fever last night, temperature 101.1 degrees Fahrenheit. The patient was extremely weak and needed support by her daughter to go to the bathroom and even then transferring from wheelchair to the lifting chair was extremely laborious for her daughter as the patient had no strength. For the last 2 days, the patient is feeling very weak and gradually getting worse. As per daughter, on Saturday night, which was the night before yesterday, she also threw up just a little bit amount, which was just foam and some sip of water which she took, but other than that she denies any complaint. She denies any cough or chills. She denies any diarrhea or abdominal pain, but says that her urine smells for the last few days now. She has urinary incontinence at baseline. She was brought to Emergency Room. Her pulse ox, as per daughter, she checked at home, was 85% but after getting oxygen in the Emergency Room it came up.   Allergies:  Codeine: Unknown  Ketek: Resp. Distress  Aricept: Alt Ment Status  Routine Micro:  21-Sep-15 08:30   Micro Text Report BLOOD CULTURE   COMMENT                   NO GROWTH IN 48 HOURS   ANTIBIOTIC                        Culture Comment NO GROWTH IN 48 HOURS  Result(s) reported on 10 Feb 2014 at 09:00AM.    09:00   Micro Text Report BLOOD CULTURE   COMMENT                   NO GROWTH IN 48 HOURS   ANTIBIOTIC                       Culture Comment NO GROWTH IN 48 HOURS  Result(s) reported on 10 Feb 2014 at 09:00AM.    14:53   Micro Text Report INFLUENZA A+B ANTIGENS   COMMENT                   NEGATIVE FOR INFLUENZA A (ANTIGEN ABSENT)   COMMENT                   NEGATIVE FOR INFLUENZA B (ANTIGEN ABSENT)   ANTIBIOTIC                       Comment 1.. NEGATIVE FOR INFLUENZA A (ANTIGEN ABSENT) A negative result does not exclude influenza. Correlation with clinical impression is required.  Comment 2.. NEGATIVE FOR INFLUENZA B (ANTIGEN ABSENT)  Result(s) reported on 08 Feb 2014 at 03:31PM.  22-Sep-15 01:00   Micro Text Report URINE CULTURE  COMMENT                   NO GROWTH IN 18-24 HOURS   ANTIBIOTIC                       Specimen Source IN AND OUT CATH  Culture Comment NO GROWTH IN 18-24 HOURS  Result(s) reported on 10 Feb 2014 at 10:43AM.  Cardiology:  21-Sep-15 16:35   Echo Doppler REASON FOR EXAM:     COMMENTS:     PROCEDURE: [facility] - ECHO DOPPLER COMPLETE(TRANSTHOR)  - Feb 08 2014  4:35PM   RESULT: Echocardiogram Report  Patient Name:   Daisy [name] Date of Exam: 02/08/2014 Medical Rec #:  [id number]          Custom1: Date of Birth:  17-Jan-1922       Height: Patient Age:    12 years        Weight:       201.0 lb Patient Gender: F               BSA:          2.03 m??  Indications: CHF Sonographer:    Arville Go RDCS Referring Phys: Vaughan Basta  Sonographer Comments: Technically difficult study due to poor echo  windows, no subcostal window and suboptimal apical window.  Summary:  1. Left ventricular ejection fraction, by visual estimation, is 70 to  75%.  2. Normal global left ventricular systolic function.  3. Impaired relaxation pattern of LV diastolic filling.   4. Moderate concentric left ventricular hypertrophy.  5. Moderately increased left ventricular septal thickness.  6. Moderately increased left ventricular posterior wall thickness. 2D AND M-MODE MEASUREMENTS (normal ranges within parentheses): Left Ventricle:          Normal IVSd (2D):      1.83 cm (0.7-1.1) LVPWd (2D):     1.32 cm (0.7-1.1) Aorta/LA:                  Normal LVIDd (2D):     3.10 cm (3.4-5.7) Aortic Root (2D): 2.70 cm (2.4-3.7) LVIDs (2D):     1.90 cm           Left Atrium (2D): 2.50 cm (1.9-4.0) LV FS (2D):     38.7 %   (>25%) LV EF (2D):     70.6 %   (>50%)                                   Right Ventricle:       RVd (2D): SPECTRAL DOPPLER ANALYSIS (where applicable): Aortic Valve: AoV Max Vel: 1.75 m/s AoV Peak PG: 12.2 mmHg AoV Mean PG: LVOT Vmax:  LVOT VTI:  LVOT Diameter: 1.70 cm Tricuspid Valve and PA/RV Systolic Pressure: TR Max Velocity: 2.5ms RA  Pressure: 5 mmHg RVSP/PASP: 31.1 mmHg Pulmonic Valve: PV Max Velocity: 1.13 m/s PV Max PG: 5.1 mmHg PV Mean PG: PHYSICIAN INTERPRETATION: Left Ventricle: The left ventricular internal cavity size was normal. LV  septal wall thickness was moderately increased. LV posterior wall  thickness was moderately increased. Moderate concentric left ventricular  hypertrophy. Global LV systolic function was normal. Left ventricular  ejection fraction, by visual estimation, is 70 to 75%. Spectral Doppler  shows impaired relaxation pattern of LV diastolic filling. Right Ventricle: The right ventricular size is mildly enlarged. Tricuspid Valve: Trivial tricuspid regurgitation is visualized.  The  tricuspid regurgitant velocity is 2.55 m/s, and with an assumed right  atrial pressure of 5 mmHg, the estimated right ventricular systolic  pressure is normal at 31.1 mmHg. Aortic Valve: Trivial aortic valve regurgitation is seen.  [place] MD Electronically signed by [id number] Bartholome Bill MD Signature Date/Time: 02/09/2014/1:59:14  PM  *** Final ***  IMPRESSION: .    Verified By: Teodoro Spray, M.D., MD  Routine Chem:  21-Sep-15 04:38   Glucose, Serum  134  BUN 15  Creatinine (comp)  1.33  Sodium, Serum 138  Potassium, Serum 3.5  Chloride, Serum 101  CO2, Serum 27  Calcium (Total), Serum  8.4  Anion Gap 10  Osmolality (calc) 278  eGFR (African American)  41  eGFR (Non-African American)  35 (eGFR values <24m/min/1.73 m2 may be an indication of chronic kidney disease (CKD). Calculated eGFR is useful in patients with stable renal function. The eGFR calculation will not be reliable in acutely ill patients when serum creatinine is changing rapidly. It is not useful in  patients on dialysis. The eGFR calculation may not be applicable to patients at the low and high extremes of body sizes, pregnant women, and vegetarians.)  B-Type Natriuretic Peptide ([facility]  2361 (Result(s) reported on 08 Feb 2014 at 06:15AM.)  22-Sep-15 04:52   Glucose, Serum  181  BUN  21  Creatinine (comp)  1.91  Sodium, Serum 140  Potassium, Serum 3.6  Chloride, Serum 101  CO2, Serum  34  Calcium (Total), Serum  8.4  Anion Gap  5  Osmolality (calc) 287  eGFR (African American)  26  eGFR (Non-African American)  23 (eGFR values <671mmin/1.73 m2 may be an indication of chronic kidney disease (CKD). Calculated eGFR is useful in patients with stable renal function. The eGFR calculation will not be reliable in acutely ill patients when serum creatinine is changing rapidly. It is not useful in  patients on dialysis. The eGFR calculation may not be applicable to patients at the low and high extremes of body sizes, pregnant women, and vegetarians.)  23-Sep-15 04:00   Creatinine (comp)  1.64  eGFR (African American)  32  eGFR (Non-African American)  27 (eGFR values <6029min/1.73 m2 may be an indication of chronic kidney disease (CKD). Calculated eGFR is useful in patients with stable renal function. The eGFR calculation will not  be reliable in acutely ill patients when serum creatinine is changing rapidly. It is not useful in  patients on dialysis. The eGFR calculation may not be applicable to patients at the low and high extremes of body sizes, pregnant women, and vegetarians.)  24-Sep-15 04:23   Glucose, Serum 97  BUN  25  Creatinine (comp)  1.55  Sodium, Serum 139  Potassium, Serum 4.1  Chloride, Serum 101  CO2, Serum  35  Calcium (Total), Serum  8.0  Anion Gap  3  Osmolality (calc) 282  eGFR (African American)  40  eGFR (Non-African American)  33 (eGFR values <25m38mn/1.73 m2 may be an indication of chronic kidney disease (CKD). Calculated eGFR, using the MRDR Study equation, is useful in  patients with stable renal function. The eGFR calculation will not be reliable in acutely ill patients when serum creatinine is changing rapidly. It is not useful in patients on dialysis. The eGFR calculation may not be applicable to patients at the low and high extremes of body sizes, pregnant women, and vetetarians.)  Cardiac:  21-Sep-15 04:38   Troponin I < 0.02 (0.00-0.05 0.05 ng/mL or less:  NEGATIVE  Repeat testing in 3-6 hrs  if clinically indicated. >0.05 ng/mL: POTENTIAL  MYOCARDIAL INJURY. Repeat  testing in 3-6 hrs if  clinically indicated. NOTE: An increase or decrease  of 30% or more on serial  testing suggests a  clinically important change)  Routine UA:  21-Sep-15 14:44   Color (UA) Yellow  Clarity (UA) Cloudy  Glucose (UA) Negative  Bilirubin (UA) Negative  Ketones (UA) Negative  Specific Gravity (UA) 1.005  Blood (UA) 3+  pH (UA) 5.0  Protein (UA) 75 mg/dL  Nitrite (UA) Positive  Leukocyte Esterase (UA) 3+ (Result(s) reported on 08 Feb 2014 at 03:18PM.)  RBC (UA) 197 /HPF  WBC (UA) 137 /HPF  Bacteria (UA) 3+  Epithelial Cells (UA) 2 /HPF  WBC Clump (UA) PRESENT  Mucous (UA) PRESENT (Result(s) reported on 08 Feb 2014 at 03:18PM.)  Routine Hem:  21-Sep-15 04:38   WBC (CBC)   12.5  RBC (CBC) 4.02  Hemoglobin (CBC) 12.4  Hematocrit (CBC) 37.8  Platelet Count (CBC) 208 (Result(s) reported on 08 Feb 2014 at 05:00AM.)  MCV 94  MCH 30.7  MCHC 32.7  RDW 14.3  22-Sep-15 04:52   WBC (CBC)  15.8  RBC (CBC) 4.00  Hemoglobin (CBC) 12.0  Hematocrit (CBC) 38.8  Platelet Count (CBC) 215  MCV 97  MCH 29.9  MCHC  30.9  RDW  14.6  Neutrophil % 93.5  Lymphocyte % 3.4  Monocyte % 3.0  Eosinophil % 0.1  Basophil % 0.0  Neutrophil #  14.8  Lymphocyte #  0.5  Monocyte # 0.5  Eosinophil # 0.0  Basophil # 0.0 (Result(s) reported on 09 Feb 2014 at 05:29AM.)   PERTINENT RADIOLOGY STUDIES: XRay:    21-Sep-15 05:28, Chest PA and Lateral  Chest PA and Lateral   REASON FOR EXAM:    sob  COMMENTS:       PROCEDURE: DXR - DXR CHEST PA (OR AP) AND LATERAL  - Feb 08 2014  5:28AM     CLINICAL DATA:  Shortness of breath, oxygen dependent.    EXAM:  CHEST  2 VIEW    COMPARISON:  Chest radiograph September 11, 2013    FINDINGS:  The cardiac silhouette appears mildly enlarged. Tortuous mildly  calcified aorta. No pleural effusions or focal consolidations. Mild  bronchitic changes. No pneumothorax. Patient is osteopenic. Moderate  degenerative change of thoracic spine.    IMPRESSION:  Mild cardiomegaly.  Bronchitic changes without focal consolidation.      Electronically Signed    By: Elon Alas    On: 02/08/2014 05:35         Verified By: Ricky Ala, M.D.,    22-Sep-15 13:34, Chest PA and Lateral  Chest PA and Lateral   REASON FOR EXAM:    compare- Pulmonary edema/ Bronchitis.  COMMENTS:       PROCEDURE: DXR - DXR CHEST PA (OR AP) AND LATERAL  - Feb 09 2014  1:34PM     CLINICAL DATA:  Pulmonary edema    EXAM:  CHEST  2 VIEW    COMPARISON:  02/08/2014    FINDINGS:  Moderate enlargement of the cardiac silhouette is reidentified with  central vascular congestion but no overt edema. Trace left pleural  fluid again noted. Aeration is  decreased since previously with  patchy bibasilar presumed atelectasis but no new focal airspace  opacity.     IMPRESSION:  Low lung volumes with crowding of the bronchovascular markings but  no new focal acute  finding.      Electronically Signed    By: Conchita Paris M.D.    On: 02/09/2014 14:36         Verified By: Arline Asp, M.D.,  [place]:    21-Sep-15 05:28, Chest PA and Lateral  PACS Image     22-Sep-15 13:34, Chest PA and Lateral  PACS Image    Pertinent Past History:  Pertinent Past History 1.  Mild dementia. 2.  Depression. 3.  Osteoarthritis. 4.  Hypertension. 5.  Coronary artery disease.  6.  CHF.  7.  COPD.   8.  TIA.  9.  Hyperlipidemia.  10.  Urinary incontinence.  11.  Bladder prolapse.   Hospital Course:  Hospital Course A 81 year old female with multiple past medical history who came to Emergency Room with daughter because of fever and extreme weakness for the last 2 days.  1.  Acute diastolic congestive heart failure evident by edema, shortness of breath and ejection fraction up to 50% to 55%, as previous echo last year, with some diastolic stiffness.  given her IV Lasix b.i.d. and monitoring renal function.   now due to worsening renal function- stopped lasix- improved creatinin.   Need home Oxygen due to CHF. 2.  Fever. due to UTI- complained by family.  Currently chest x-ray showed some bronchitic changes- on Levaquin.  Blood cultures were collected by ER- negative.   Urinalysis positive- cx negative, on Levaquin already.    Due to generalized weakness- assess by PT and suggested to have rehab. 3.  History of chronic obstructive pulmonary disease.   Although there are some bronchitic changes and fever, there is no acute wheezing currently so we will just put her on nebulizer and inhaler treatment. No need for IV steroid at this time.  4.  Hypertension. On amlodipine and Carvedilol at home,initially blood pressure is running on lower  normal side with this fever and requirement of IV Lasix. hold all other antihypertensive medications.    Now BP improving- so restarted. 5.  Coronary artery disease. continue statin and aspirin, but will hold Coreg because of low-normal blood pressure. 6.  Hyperlipidemia. Continue statin.   Condition on Discharge Stable   DISCHARGE INSTRUCTIONS HOME MEDS:  Medication Reconciliation: Patient's Home Medications at Discharge:     Medication Instructions  advair diskus 250 mcg-50 mcg inhalation powder  1 puff(s) inhaled 2 times a day   omeprazole 20 mg oral delayed release capsule  1 cap(s) orally once a day for acidity   amlodipine 2.5 mg oral tablet  1 tab(s) orally once a day   carvedilol 12.5 mg oral tablet  1 tab(s) orally 2 times a day   cetirizine 10 mg oral tablet  1 tab(s) orally once a day (at bedtime)   aspirin enteric coated 325 mg oral delayed release tablet  1 tab(s) orally once a day   b-12 1000 mcg oral tablet  1 tab(s) orally once a day   citalopram 20 mg oral tablet  1.5 tab(s) orally once a day   ferrous sulfate 325 mg oral tablet  1 tab(s) orally once a day   simvastatin 20 mg oral tablet  1 tab(s) orally once a day (at bedtime)   furosemide 40 mg oral tablet  0.5 tab(s) orally once a day   acetaminophen 325 mg oral tablet  1 tab(s) orally every 6 hours, As needed, moderate pain (4-6/10)   magnesium hydroxide 8% oral suspension  30 milliliter(s) orally once a day (at bedtime),  As needed, constipation   alprazolam 0.25 mg oral tablet  1 tab(s) orally once a day (at bedtime), As Needed, anxiety , As needed, anxiety   tiotropium 18 mcg inhalation capsule  1 cap(s) inhaled once a day   levofloxacin 250 mg oral tablet  1 tab(s) orally every 24 hours x 3 days     Physician's Instructions:  Home Oxygen? Yes   Oxygen delivery at home: 1L  Nasal Cannula   Diet Low Sodium  Low Fat, Low Cholesterol   Activity Limitations As tolerated   Return to Work Not Applicable    Time frame for Follow Up Appointment 1-2 weeks   Other Comments routine follow ups with PMD.   Electronic Signatures: Vaughan Basta (MD)  (Signed 24-Sep-15 11:20)  Authored: ADMISSION DATE AND DIAGNOSIS, CHIEF COMPLAINT/HPI, Allergies, PERTINENT LABS, PERTINENT RADIOLOGY STUDIES, PERTINENT PAST HISTORY, HOSPITAL COURSE, DISCHARGE INSTRUCTIONS HOME MEDS, PATIENT INSTRUCTIONS   Last Updated: 24-Sep-15 11:20 by Vaughan Basta (MD)

## 2014-09-11 NOTE — H&P (Signed)
PATIENT NAME:  Daisy [name], Daisy [name] MR#:  [id number] DATE OF BIRTH:  03-Oct-1921  DATE OF ADMISSION:  02/08/2014  PRIMARY CARE PHYSICIAN: Dr. Baldemar Lenis  REFERRING EMERGENCY ROOM PHYSICIAN: Dr. Charlesetta Ivory  CHIEF COMPLAINT: Shortness of breath and fever.   HISTORY OF PRESENTING ILLNESS: A 81 year old female with history of CVA, CHF, COPD, coronary artery disease, hyperlipidemia, urinary incontinence, hypertension, and mild dementia who lives with her daughter and is very weak at baseline so sometimes has to use a wheelchair, has a lifting chair in house. As per daughter who lives with her, the patient is feeling more weak and says that she feels awful for the last 2 days. Daughter states that she noticed the patient had some fever last night, temperature 101.1 degrees Fahrenheit. The patient was extremely weak and needed support by her daughter to go to the bathroom and even then transferring from wheelchair to the lifting chair was extremely laborious for her daughter as the patient had no strength. For the last 2 days, the patient is feeling very weak and gradually getting worse. As per daughter, on Saturday night, which was the night before yesterday, she also threw up just a little bit amount, which was just foam and some sip of water which she took, but other than that she denies any complaint. She denies any cough or chills. She denies any diarrhea or abdominal pain, but says that her urine smells for the last few days now. She has urinary incontinence at baseline. She was brought to Emergency Room. Her pulse ox, as per daughter, she checked at home, was 85% but after getting oxygen in the Emergency Room it came up.  REVIEW OF SYSTEMS: Unable to get as the patient has some baseline dementia.   PAST MEDICAL HISTORY:  1.  Mild dementia. 2.  Depression. 3.  Osteoarthritis. 4.  Hypertension. 5.  Coronary artery disease.  6.  CHF.  7.  COPD.   8.  TIA.  9.  Hyperlipidemia.  10.  Urinary  incontinence.  11.  Bladder prolapse.   PAST SURGICAL HISTORY:  1.  Right knee surgery.  2.  Total abdominal hysterectomy with bilateral salpingo-oophorectomy. 3.  Bladder tuck x3.   SOCIAL HISTORY: Remote history of smoking and her husband was a smoker so she was exposed to secondhand smoking almost 20 years ago. Denies alcohol or illegal drug use. Lives with her daughter and needs help in day-to-day activities, almost wheelchair-bound with minimal walking at home with support.   FAMILY HISTORY: Positive for multiple myeloma in one of her sisters and COPD in 1 brother.   HOME MEDICATIONS:  1.  Simvastatin 20 mg oral once a day.  2.  Omeprazole 20 mg once a day.  3.  Furosemide 40 mg oral once a day. 4.  Ferrous sulfate 325 mg oral once a day. 5.  Citalopram 20 mg oral once a day. 6.  Cetirizine 10 mg oral once a day.  7.  Carvedilol 12.5 mg 2 times a day.  8.  B12 1000 mcg oral tablet once a day.  9.  Aspirin 325 mg once a day.  10.  Amlodipine 2.5 mg once a day.  11.  Advair Diskus 1 puff inhalation 2 times a day.   PHYSICAL EXAMINATION: VITAL SIGNS: In the ER, temperature 98.3, pulse 93, respirations 22, blood pressure 106/86, and pulse ox is 96 on oxygen supplementation, 2 to 3 liters.  GENERAL: The patient is alert and oriented to person. She is able to  identify her daughter, but still getting confused between 2 daughters about their habits.  HEENT: Head and neck atraumatic. Conjunctivae pink. Oral mucosa moist.  NECK: Supple. No JVD.  RESPIRATORY: Bilateral equal air entry. Some crepitation heard. No prominent wheezing.  CARDIOVASCULAR: S1 and S2 present, regular. No murmur.  ABDOMEN: Soft and nontender, obese, bowel sounds present. No organomegaly.  SKIN: No rashes.  LEGS: Significant edema present of bilateral legs but it is chronic.  JOINTS: No swelling or tenderness.  NEUROLOGIC: No numbness. Power 4 out of 5 in upper limbs and 2 to 3 out of 5 in lower limbs. No rigidity  or tremors. Follows very simple commands.  DIAGNOSTIC DATA: Important lab results: Glucose 134. BNP 2,361. BUN 15, creatinine 1.33, sodium 138, potassium 3.5, chloride 101, CO2 27. Calcium 8.4.   Troponin less than 0.02.   WBC 12.5, hemoglobin 12.4, platelet 208,000 and MCV 94.   Chest x-ray, PA and lateral, is done and showed mild cardiomegaly and bronchitic changes without focal consolidation.   ASSESSMENT AND PLAN: A 81 year old female with multiple past medical history who came to Emergency Room with daughter because of fever and extreme weakness for the last 2 days.  1.  Acute diastolic congestive heart failure evident by edema, shortness of breath and ejection fraction up to 50% to 55%, as previous echo last year, with some diastolic stiffness. We will give her IV Lasix b.i.d. and monitoring renal function and will do repeat echocardiogram as the last one was done more than a year ago.  2.  Fever. Currently chest x-ray showed some bronchitic changes so we will give her Levaquin. Blood cultures were collected by ER. Urinalysis still needs to be collected. We will have to follow that, but she will be on Levaquin already.  3.  History of chronic obstructive pulmonary disease. Although there are some bronchitic changes and fever, there is no acute wheezing currently so we will just put her on nebulizer and inhaler treatment. No need for IV steroid at this time.  4.  Hypertension. On amlodipine and Carvedilol at home, but currently blood pressure is running on lower normal side with this fever and requirement of IV Lasix. We will hold all other antihypertensive medications.  5.  Coronary artery disease. We will continue statin and aspirin, but will hold Coreg because of low-normal blood pressure. 6.  Hyperlipidemia. Continue statin.  CODE STATUS: FULL code.   History obtained from the patient's daughter and healthcare power of attorney, Ms. Sharyl Nimrod, and she said that they have not decided  about her resuscitation yet, but in acute event she remains a full code and they will decide later on.   TOTAL TIME SPENT ON THIS ADMISSION: 50 minutes.    ____________________________ Ceasar Lund Anselm Jungling, MD [id number] D: 02/08/2014 09:23:33 ET T: 02/08/2014 09:55:34 ET JOB#: [id number]  cc: Ceasar Lund. Anselm Jungling, MD, <Dictator> Isaias Cowman, MD Dwayne D. Clayborn Bigness, MD Derinda Late, MD Vaughan Basta MD ELECTRONICALLY SIGNED 02/20/2014 12:48

## 2014-09-12 NOTE — Consult Note (Signed)
Chief Complaint:   Subjective/Chief Complaint EGD showed a medium hiatal hernia and reflux esophagitis.  Recommendations: Continue PPI. Follow anti reflux measures. Will sign off. Thanks.   Electronic Signatures: Lurline DelIftikhar, Ilai Hiller (MD)  (Signed 07-Jan-13 14:13)  Authored: Chief Complaint   Last Updated: 07-Jan-13 14:13 by Lurline DelIftikhar, Eureka Valdes (MD)

## 2014-09-12 NOTE — Discharge Summary (Signed)
PATIENT NAME:  Daisy [name], Daisy [name] MR#:  [id number] DATE OF BIRTH:  07-Dec-1921  DATE OF ADMISSION:  05/25/2011 DATE OF DISCHARGE:  05/28/2011  PRIMARY CARE PHYSICIAN: Alonna Buckler, MD   CONSULTATING PHYSICIAN: Lurline Del, MD from GI  CHIEF COMPLAINT: Weakness, nonproductive cough, nausea, vomiting.   DISCHARGE DIAGNOSES:  1. Nausea and vomiting, resolved, likely viral gastroenteritis.  2. Hiatal hernia.  3. Reflux esophagitis.  4. Gait dysfunction. 5. Mild hyponatremia.  6. Coronary artery disease. 7. Hyperlipidemia.  8. Depression.  9. Hypertension.   DISCHARGE MEDICATIONS:  1. Losartan 100 mg daily.  2. Lisinopril 40 mg daily.  3. Furosemide 40 mg daily.  4. Hydrochlorothiazide 12.5 mg daily.  5. Propranolol 120 mg extended-release daily.  6. Crestor 5 mg daily.  7. Citalopram 30 mg daily.  8. Advair 250/50 mcg, 1 puff b.i.d.  9. Toviaz 4 mg extended-release daily.  10. Ambien 5 mg p.o. p.r.n. for sleep aid.  11. Omeprazole 20 mg p.o. b.i.d.  12. Docusate senna 5/8.6 mg p.o. b.i.d.   DIET: ADA diet.  ACTIVITY: As tolerated.   FOLLOWUP: Please follow up with your PCP within 1 to 2 weeks and check a BMP in one week.   HISTORY OF PRESENT ILLNESS: Please see the full History and Physical dictated on 05/25/2011 by Dr. Margie Ege. Briefly, this is an 81 year old female with depression, hypertension, coronary artery disease, history of myocardial infarction, who presented with nausea, vomiting. She had some sick contacts with similar symptoms. She also had some weakness and poor ambulatory effort and presented to the hospital at that time.   SIGNIFICANT LABORATORY, DIAGNOSTIC AND RADIOLOGICAL DATA:  Initial sodium 134, BUN 120, chloride 92, creatinine 1.26.  LFTs: Alkaline phosphatase was 48, albumin was 3.1.  Troponin was negative x4. TSH 1.03.  Initial WBC 6.6, hemoglobin 10.2, hematocrit 30.4.  Rapid flu negative.  Urinalysis negative.  X-ray of chest, PA and lateral,  showing no acute disease of the chest.   HOSPITAL COURSE:  1. Recurrent nausea and vomiting: This occurs mostly at night in supine position. The patient was started on PPI and GI was consulted. Also, the patient complained of some regurgitation symptoms with food coming back mostly at night when the patient was laying flat. The patient also had some sick contacts with nausea and vomiting prior to admission. While hospitalized, the patient initially had some nausea, however, that resolved and she had no vomiting. She did not require any IV Zofran. The patient was started on a diet after she was seen by Speech and Swallow evaluation. Her PPI was continued. She underwent an EGD showing a hiatal hernia of moderate size and reflux esophagitis. Their recommendation was to continue PPI twice daily. In regards to possible aspiration leading to regurgitation and cough, she was seen by Speech Therapy. She was initially made n.p.o. However, after further evaluation by Speech, a regular diet was recommended as there was no sign of aspiration. Furthermore, x-ray of the chest did not show any evidence for pneumonia. She was not hypoxic. Initially, she was started on antibiotics consisting of clindamycin; however, I doubt she has aspiration pneumonia or pneumonitis. She did have mild leukocytosis, however, she did receive steroids in the ED. She was not hypoxic and therefore the clindamycin was discontinued.  2. Mild hyponatremia: Initial sodium was in the mid 130s, which trended down slowly. The patient is on hydrochlorothiazide as well as had some GI losses prior to admission via nausea and vomiting and then had been made n.p.o.  This is stable around the low 130s, and this could be rechecked as an outpatient. Furthermore, the patient is on hydrochlorothiazide which might also be contributing in a way. At this point, given that her nausea and vomiting has resolved, and the patient has been seen by Physical Therapy, we would  go ahead and discharge the  patient with outpatient follow-up with her PCP and a sodium check in a week.   DISPOSITION: Home with services.   CODE STATUS:  The patient is FULL CODE.     TOTAL TIME SPENT: 35 minutes   ____________________________ Krystal EatonShayiq Manolito Jurewicz, MD sa:cbb D: 05/28/2011 16:24:11 ET T: 05/29/2011 16:20:42 ET JOB#: [id number]  cc: Krystal EatonShayiq Yaneliz Radebaugh, MD, <Dictator> Reola MosherAndrew S. Randa LynnLamb, MD Krystal EatonSHAYIQ Chekesha Behlke MD ELECTRONICALLY SIGNED 06/01/2011 20:37

## 2014-09-12 NOTE — H&P (Signed)
PATIENT NAME:  Daisy [name], Daisy [name] MR#:  [id number] DATE OF BIRTH:  11-02-1921  DATE OF ADMISSION:  05/25/2011  REFERRING PHYSICIAN: Dr. Darnelle CatalanMalinda   PRIMARY CARE PHYSICIAN: Alonna BucklerAndrew Lamb, MD   PRESENTING COMPLAINT: Weakness and unsteady gait with cough, nonproductive, and recurring nausea and vomiting.   HISTORY OF PRESENT ILLNESS: Ms. Daisy [name] is an 81 year old woman with history of depression, hypertension, coronary artery disease, MI, osteoarthritis, and probable Alzheimer's who presents today with complaints of developing cold symptoms around Christmas, 05/15/2011. She reports nonproductive cough. Denies any chest pain or shortness of breath. Endorses sick contact around that time with someone that had similar symptoms of fever and cough. She was initiated on Z-Pak this Monday and is on day three, however, continues to have symptoms and has progressively been getting weak with poor ambulatory effort and unsteady gait. She denies any syncope or loss of consciousness. No palpitations, orthopnea, or PND.   PAST MEDICAL HISTORY:  1. Probable Alzheimer's.  2. Depression.  3. Osteoarthritis.  4. Hypertension.  5. Coronary artery disease and MI.  6. Hyperlipidemia.  7. Urinary incontinence.   PAST SURGICAL HISTORY:  1. Right knee surgery.  2. Total abdominal hysterectomy with bilateral salpingo-oophorectomy.  3. Bladder tack x3.   ALLERGIES: Codeine and Aricept.    MEDICATIONS:  1. Aspirin 325 mg 3 times per week as needed.  2. Valium 2 mg as needed.  3. Lasix 40 mg daily.  4. Inderal LA 120 mg daily.  5. Crestor 7.5 mg daily.  6. Losartan 100 mg daily.  7. Lisinopril 40 mg daily.  8. Hydrochlorothiazide 12.5 mg daily.  9. Advair 250/50 b.i.d.  10. Citalopram 30 mg daily.  11. Tylenol Extra-Strength 500 mg 1 to 2 tablets as needed.  12. VESIcare 5 mg daily.  13. Mobic 7.5 mg daily.  14. Omeprazole 20 mg b.i.d.   FAMILY HISTORY: No heart attacks, strokes, or diabetes. Her sister died  from cancer.   SOCIAL HISTORY: She lives in [place] alone but her daughter stays with her at night sometimes. She quit tobacco 30 years ago. Rare alcohol use. No drug use.  REVIEW OF SYSTEMS: CONSTITUTIONAL: No fevers. She has cold chills. No weight changes. EYES: No glaucoma or cataracts. ENT: No ear pain, epistaxis, or discharge. RESPIRATORY: As per history of present illness. No hemoptysis. Denies any shortness of breath. CARDIOVASCULAR: No chest pain. She has chronic lower extremity edema. No palpitations or syncope. GI: She endorses intermittent nausea and vomiting mainly at night. No abdominal pain, diarrhea, hematemesis, or melena. GU: No dysuria or hematuria. ENDOCRINE: No polyuria or polydipsia. HEME: No easy bleeding. SKIN: No ulcers. MUSCULOSKELETAL: Denies any neck pain or joint pain. NEUROLOGIC: No one-sided weakness or numbness. PSYCHIATRIC: She endorses depression and anxiety but no suicidal ideation.   PHYSICAL EXAMINATION:   VITAL SIGNS: Temperature 98.4, pulse 77, respiratory rate 18, blood pressure 135/63, sating at 94% on room air.   GENERAL: Lying in bed in no apparent distress.   HEENT: Normocephalic, atraumatic. Pupils are equal, symmetric. Nares without discharge. She has slightly dry mucous membrane.   NECK: Soft and supple. No adenopathy or JVP.   CARDIOVASCULAR: Non-tachy. No murmurs, rubs, or gallops.   LUNGS: Faint basilar crackles. No use of accessory muscles or increased respiratory effort.   ABDOMEN: Soft, nontender. Positive bowel sounds. No mass appreciated.   EXTREMITIES: Trace edema bilaterally. Dorsal pedis pulses intact.   MUSCULOSKELETAL: No joint effusion.   SKIN: No ulcers.   NEUROLOGIC: Symmetrical strength. No  focal deficits.   PSYCH: She is alert. She requires assistance with her history and physical from her daughter.   PERTINENT LABS AND STUDIES: Urinalysis with specific gravity of 1.006, pH 7, WBC 2 per high-power field. Influenza A  and B negative. Chest, PA and lateral, without evidence of infiltrate. Glucose 114, BUN 20, creatinine 1.26, sodium 134, potassium 3.5, chloride 92, carbon dioxide 32, calcium 8.1, alkaline phosphatase 48, total bilirubin 0.4, ALT 18, AST 16, total protein 6.4, albumin 3.1, WBC 6.6, hemoglobin 10.2, hematocrit 30.4, platelets 215, MCV 90. Troponin less than 0.02. EKG with sinus rate of 76 and PVCs. No ST elevation or depression. She has left bundle branch block.   ASSESSMENT AND PLAN: Ms. Daisy [name] is an 81 year old woman with history of depression, osteoarthritis, hypertension, coronary artery disease, and hyperlipidemia presenting with progressive weakness, unsteady gait, nonproductive cough, and recurring nausea and vomiting.  1. Gait disturbance and ambulatory dysfunction possibly in the setting of infection possibly even viral but given her history concern for possible aspiration event. Her chest x-ray does not show any infiltrate. Will keep n.p.o. except for meds. Get speech evaluation. Empirically start on clindamycin. Will get PT evaluation and Care Management consultation.  2. Recurring nausea and vomiting mainly at night, unclear etiology, could be medication related. As above, speech evaluation. Will also obtain GI evaluation.  3. Coronary artery disease, history of MI. Low suspicion for acute coronary syndrome for her nausea and vomiting with her symptoms going on for a month and her enzymes negative. Will continue to cycle enzymes. Place on tele. Will restart her aspirin at daily regimen as she's only taking it as needed. Will restart her statin and propranolol.  4. Hypertension, accelerated, with intermittent spikes in blood pressure likely cough related. In general, her blood pressure is good without intervention. Will resume her blood pressure medications and continue to follow.  5. Prophylaxis with aspirin, omeprazole, and Lovenox.   TIME SPENT: Approximately 50 minutes spent on patient care.    ____________________________ Reuel Derby, MD ap:drc D: 05/25/2011 08:38:23 ET [name]: 05/25/2011 09:43:53 ET JOB#: [id number]  cc: Pearlean Brownie Otis Burress, MD, <Dictator> Reola Mosher. Randa Lynn, MD Reuel Derby MD ELECTRONICALLY SIGNED 06/09/2011 2:22

## 2014-09-12 NOTE — Consult Note (Signed)
Chief Complaint:   Subjective/Chief Complaint Overall same but still c/o regurgitation. Wants to go home. EGD probably Monday as I would lie it to be done with the help of anesthesiologist if possible. Discussed with the patient and her daughter.   VITAL SIGNS/ANCILLARY NOTES: **Vital Signs.:   05-Jan-13 09:37   Vital Signs Type Q 4hr   Temperature Temperature (F) 98.3   Celsius 36.8   Temperature Source oral   Pulse Pulse 74   Pulse source per Dinamap   Respirations Respirations 20   Systolic BP Systolic BP 312   Diastolic BP (mmHg) Diastolic BP (mmHg) 74   Mean BP 105   BP Source Dinamap   Pulse Ox % Pulse Ox % 95   Pulse Ox Activity Level  At rest   Oxygen Delivery Room Air/ 21 %   Routine Chem:  05-Jan-13 02:59    Glucose, Serum 122   BUN 20   Creatinine (comp) 1.10   Sodium, Serum 133   Potassium, Serum 3.4   Chloride, Serum 94   CO2, Serum 29   Calcium (Total), Serum 7.8   Osmolality (calc) 270   eGFR (African American) >60   eGFR (Non-African American) 50   Anion Gap 10  Routine Hem:  05-Jan-13 02:59    WBC (CBC) 11.8   RBC (CBC) 3.14   Hemoglobin (CBC) 9.4   Hematocrit (CBC) 28.3   Platelet Count (CBC) 228   MCV 90   MCH 30.0   MCHC 33.2   RDW 14.1  Cardiac:  05-Jan-13 02:59    Troponin I < 0.02   CK, Total 49   CPK-MB, Serum 1.1  Routine Hem:  05-Jan-13 02:59    Neutrophil % 82.2   Lymphocyte % 8.7   Monocyte % 8.9   Eosinophil % 0.1   Basophil % 0.1   Neutrophil # 9.7   Lymphocyte # 1.0   Monocyte # 1.1   Eosinophil # 0.0   Basophil # 0.0  Routine Chem:  05-Jan-13 02:59    Magnesium, Serum 1.8   Hemoglobin A1c ([facility]) 5.9   Cholesterol, Serum 145   Triglycerides, Serum 111   HDL (INHOUSE) 39   VLDL Cholesterol Calculated 22   LDL Cholesterol Calculated 84  Thyroid:  05-Jan-13 02:59    Thyroid Stimulating Hormone 1.03   Electronic Signatures: Jill Side (MD)  (Signed 05-Jan-13 10:31)  Authored: Chief Complaint, VITAL  SIGNS/ANCILLARY NOTES, Lab Results   Last Updated: 05-Jan-13 10:31 by Jill Side (MD)

## 2014-09-12 NOTE — Consult Note (Signed)
Brief Consult Note: Comments: Patient seen. Full consult dictated. Upper respiratory tract infection. No evidence of aspiration on speech evaluation. Regurgitation and vomiting, ? GERD vs PUD vs Gastroparesis.  Recommendations: Agree with PPI. EGD Sunday or Monday depending on her overall condition and respiratory status. Will follow.  Electronic Signatures: Lurline DelIftikhar, Arta Stump (MD)  (Signed 04-Jan-13 21:39)  Authored: Brief Consult Note   Last Updated: 04-Jan-13 21:39 by Lurline DelIftikhar, Nataliah Hatlestad (MD)

## 2014-09-12 NOTE — Consult Note (Signed)
Chief Complaint:   Subjective/Chief Complaint Overall feels much better. Will proceed with an EGD tomorrow. procedure was discussed with the patient and her family in detail. Further recommendations to follow.   Electronic Signatures: Lurline DelIftikhar, Trudee Chirino (MD)  (Signed 06-Jan-13 15:00)  Authored: Chief Complaint   Last Updated: 06-Jan-13 15:00 by Lurline DelIftikhar, Ja Ohman (MD)

## 2014-09-12 NOTE — Consult Note (Signed)
PATIENT NAME:  Daisy [name], Daisy [name] MR#:  [id number] DATE OF BIRTH:  01-07-1922  DATE OF CONSULTATION:  05/25/2011  REFERRING PHYSICIAN:  Alonna BucklerAndrew Lamb, MD CONSULTING PHYSICIAN:  Lurline DelShaukat Karry Barrilleaux, MD  REASON FOR CONSULTATION: Nausea, vomiting, regurgitation.   HISTORY OF PRESENT ILLNESS: An 81 year old female with history of hypertension, coronary artery disease, and mild osteoarthritis, probable Alzheimer disease. The patient was admitted with symptoms consistent with upper respiratory tract infection for about a week. She has been reporting nonproductive cough. She was treated with Z-Pak as outpatient without improvement. On admission there was some concern about possible aspiration and she was started on clindamycin. Speech evaluation was done which is unremarkable, and no frank aspiration was noted. The patient is also reporting regurgitation and vomiting for the last couple of months. According to the daughter, this is an intermittent problem and usually happens at night where patient would burp quite a bit often followed by either passive regurgitation or active vomiting. She denies significant abdominal pain. No diarrhea or constipation was reported. No hematemesis or melena. The patient has never had an upper GI endoscopy and she has never used any medicine for acid reflux.   PAST MEDICAL HISTORY: Probable Alzheimer's disease, depression, osteoarthritis, hypertension, coronary artery disease, status post myocardial infarction, hyperlipidemia, and history of urinary incontinence.   PAST SURGICAL HISTORY:  Right knee surgery, total abdominal hysterectomy, and bladder tack.   ALLERGIES: Aricept and codeine.   MEDICINES: Aspirin, Valium, Lasix, Inderal, Crestor, losartan, lisinopril, hydrochlorothiazide, Advair, citalopram, Tylenol, VESIcare.  Mobic and omeprazole were reported also on his home medications although patient's daughter who has a list of her current home medicines denies her ever using  these medicines.   FAMILY HISTORY: Quite unremarkable.   SOCIAL HISTORY: She lives alone. She quit tobacco about 30 years ago. She does not drink.   REVIEW OF SYSTEMS: Grossly negative except for what is mentioned in the History of Present Illness.   PHYSICAL EXAMINATION:  GENERAL: Elderly female. She is fairly well built. She does not appear to be in any acute distress. She is quite awake and alert.   VITAL SIGNS:  She is afebrile with a temperature of 98.2, pulse 80, respirations 18, blood pressure 123/77.   SKIN: Grossly unremarkable.   HEENT: Negative for jaundice.   NECK: Neck veins are flat.   LUNGS: Clear to auscultation bilaterally with fair air entry. No added sounds.   CARDIOVASCULAR: Regular rate and rhythm. No murmurs were heard.   ABDOMEN: Slightly distended but soft. Bowel sounds positive. Nontender. No rebound or guarding was noted.   NEUROLOGIC: Examination appears to be quite unremarkable.   LABORATORIES:  White cell count is 6.6, hemoglobin 10.2, hematocrit 30.4, platelet count of 215,000, MCV is normal at 90. Cardiac enzymes are negative. Liver enzymes are grossly unremarkable. BUN 20, creatinine 1.26. Serum glucose is 114. Urinalysis is unremarkable. Influenza A and B antigens are negative. Chest x-ray is unremarkable.   ASSESSMENT AND PLAN:  1. Patient with probable upper respiratory tract infection and probably viral.   2. Questionable aspiration. A speech evaluation is negative for aspiration.  3. Regurgitation and vomiting on and off for 2 months. She may be suffering from acid reflux. Other possibilities include gastroparesis, partial gastric obstruction, or peptic ulcer disease. I would recommend continuing her on omeprazole. The patient would require an upper GI endoscopy, although I will wait about another 48 hours or so for further improvement in her respiratory status, which already seems to be fairly stable at  this point. Further recommendations to  follow depending on the results of the upper GI endoscopy. The procedure was discussed with the patient and her daughter in detail and they are in full agreement. We will follow.      ____________________________ Lurline Del, MD si:vtd D: 05/25/2011 21:45:13 ET [name]: 05/26/2011 12:16:22 ET JOB#: [id number]  cc: Lurline Del, MD, <Dictator> Reola Mosher. Randa Lynn, MD Lurline Del MD ELECTRONICALLY SIGNED 05/26/2011 14:37

## 2014-09-20 ENCOUNTER — Emergency Department
Admission: EM | Admit: 2014-09-20 | Discharge: 2014-09-20 | Disposition: A | Discharge status: Home / Self Care | Payer: PPO | Attending: Emergency Medicine | Admitting: Emergency Medicine

## 2014-09-20 ENCOUNTER — Other Ambulatory Visit: Payer: Self-pay

## 2014-09-20 ENCOUNTER — Encounter: Payer: Self-pay | Admitting: Emergency Medicine

## 2014-09-20 DIAGNOSIS — R410 Disorientation, unspecified: Secondary | ICD-10-CM

## 2014-09-20 DIAGNOSIS — I10 Essential (primary) hypertension: Secondary | ICD-10-CM | POA: Insufficient documentation

## 2014-09-20 DIAGNOSIS — Z79899 Other long term (current) drug therapy: Secondary | ICD-10-CM | POA: Diagnosis not present

## 2014-09-20 DIAGNOSIS — N3000 Acute cystitis without hematuria: Secondary | ICD-10-CM | POA: Diagnosis not present

## 2014-09-20 DIAGNOSIS — R4182 Altered mental status, unspecified: Secondary | ICD-10-CM | POA: Diagnosis present

## 2014-09-20 DIAGNOSIS — Z7951 Long term (current) use of inhaled steroids: Secondary | ICD-10-CM | POA: Diagnosis not present

## 2014-09-20 LAB — URINALYSIS COMPLETE WITH MICROSCOPIC (ARMC ONLY)
BACTERIA UA: NONE SEEN
Bacteria, UA: NONE SEEN
Bilirubin Urine: NEGATIVE
Bilirubin Urine: NEGATIVE
Glucose, UA: NEGATIVE mg/dL
Glucose, UA: NEGATIVE mg/dL
KETONES UR: NEGATIVE mg/dL
Ketones, ur: NEGATIVE mg/dL
Nitrite: NEGATIVE
Nitrite: NEGATIVE
PH: 7 (ref 5.0–8.0)
PROTEIN: 30 mg/dL — AB
Protein, ur: 30 mg/dL — AB
SPECIFIC GRAVITY, URINE: 1.002 — AB (ref 1.005–1.030)
Specific Gravity, Urine: 1.002 — ABNORMAL LOW (ref 1.005–1.030)
pH: 7 (ref 5.0–8.0)

## 2014-09-20 LAB — COMPREHENSIVE METABOLIC PANEL
ALBUMIN: 3.5 g/dL (ref 3.5–5.0)
ALK PHOS: 62 U/L (ref 38–126)
ALT: 9 U/L — AB (ref 14–54)
AST: 14 U/L — AB (ref 15–41)
Anion gap: 8 (ref 5–15)
BUN: 23 mg/dL — AB (ref 6–20)
CALCIUM: 8.7 mg/dL — AB (ref 8.9–10.3)
CHLORIDE: 99 mmol/L — AB (ref 101–111)
CO2: 29 mmol/L (ref 22–32)
Creatinine, Ser: 1.01 mg/dL — ABNORMAL HIGH (ref 0.44–1.00)
GFR calc non Af Amer: 48 mL/min — ABNORMAL LOW (ref >60)
GFR, EST AFRICAN AMERICAN: 55 mL/min — AB (ref >60)
Glucose, Bld: 101 mg/dL — ABNORMAL HIGH (ref 65–99)
Potassium: 4.4 mmol/L (ref 3.5–5.1)
Sodium: 136 mmol/L (ref 135–145)
TOTAL PROTEIN: 6.7 g/dL (ref 6.5–8.1)
Total Bilirubin: 0.4 mg/dL (ref 0.3–1.2)

## 2014-09-20 LAB — CBC
HCT: 37.6 % (ref 35.0–47.0)
Hemoglobin: 11.9 g/dL — ABNORMAL LOW (ref 12.0–16.0)
MCH: 28.7 pg (ref 26.0–34.0)
MCHC: 31.6 g/dL — AB (ref 32.0–36.0)
MCV: 90.6 fL (ref 80.0–100.0)
Platelets: 231 10*3/uL (ref 150–440)
RBC: 4.15 MIL/uL (ref 3.80–5.20)
RDW: 14.1 % (ref 11.5–14.5)
WBC: 11.7 10*3/uL — ABNORMAL HIGH (ref 3.6–11.0)

## 2014-09-20 LAB — TROPONIN I: Troponin I: <0.03 ng/mL (ref <0.031)

## 2014-09-20 MED ORDER — DEXTROSE 5 % IV SOLN
1.0000 g | Freq: Once | INTRAVENOUS | Status: AC
  Administered 2014-09-20: 1 g via INTRAVENOUS

## 2014-09-20 MED ORDER — CEPHALEXIN 500 MG PO CAPS: 500.0000 mg | ORAL_CAPSULE | Freq: Four times a day (QID) | ORAL | Status: AC

## 2014-09-20 MED ORDER — DEXTROSE 5 % IV SOLN
INTRAVENOUS | Status: AC
  Administered 2014-09-20: 1 g via INTRAVENOUS
  Filled 2014-09-20: qty 10

## 2014-09-20 NOTE — ED Notes (Signed)
Report received from Kelley RN

## 2014-09-20 NOTE — ED Provider Notes (Signed)
[facility] Emergency Department Provider Note    ____________________________________________  Time seen: 1520  I have reviewed the triage vital signs and the nursing notes.   HISTORY  Chief Complaint Chest Pain and Altered Mental Status   History limited by confusion.    HPI Daisy [name] is a 81 y.o. female who is brought in by family today because of increased confusion and chest pain. Family states that the chest pain started yesterday. She also had an episode of chest pain today. Family states that they have noticed her being more altered for the past week. Additionally they have noticed a foul odor to her urine. Family states that she has had similar symptoms in the past. That she gets frequent urinary tract infections. The patient herself cannot describe the chest pain fully however states it was not sharp and located in the middle of her chest.   Past Medical History  Diagnosis Date  . Other and unspecified hyperlipidemia 06/05/2013  . Essential hypertension, benign 06/05/2013  . Depression 06/05/2013  . GERD (gastroesophageal reflux disease) 06/05/2013    Patient Active Problem List   Diagnosis Date Noted  . CVA (cerebral infarction) 06/05/2013  . Hypotension, unspecified 06/05/2013  . Other and unspecified hyperlipidemia 06/05/2013  . Acute renal failure 06/05/2013  . Leukocytosis, unspecified 06/05/2013  . Essential hypertension, benign 06/05/2013  . Depression 06/05/2013  . GERD (gastroesophageal reflux disease) 06/05/2013    No past surgical history on file.  Current Outpatient Rx  Name  Route  Sig  Dispense  Refill  . acetaminophen (TYLENOL) 500 MG tablet   Oral   Take 1,000 mg by mouth every 6 (six) hours as needed for moderate pain.         Marland Kitchen. amLODipine (NORVASC) 2.5 MG tablet   Oral   Take 1 tablet (2.5 mg total) by mouth daily.   31 tablet   0   . aspirin 325 MG tablet   Oral   Take 1 tablet (325 mg total) by mouth  daily.         . carvedilol (COREG) 12.5 MG tablet   Oral   Take 1 tablet (12.5 mg total) by mouth 2 (two) times daily with a meal.   62 tablet   0   . cetirizine (ZYRTEC) 10 MG tablet   Oral   Take 10 mg by mouth every evening.         . citalopram (CELEXA) 20 MG tablet   Oral   Take 1 tablet (20 mg total) by mouth daily.   31 tablet   0   . ferrous sulfate 325 (65 FE) MG tablet   Oral   Take 325 mg by mouth daily.          . Fluticasone-Salmeterol (ADVAIR) 250-50 MCG/DOSE AEPB   Inhalation   Inhale 1 puff into the lungs 2 (two) times daily.         . furosemide (LASIX) 40 MG tablet   Oral   Take 0.5 tablets (20 mg total) by mouth daily.   30 tablet   0   . hydrocortisone cream 1 %   Topical   Apply 1 application topically 2 (two) times daily as needed (rash).         Marland Kitchen. omeprazole (PRILOSEC) 20 MG capsule   Oral   Take 20 mg by mouth daily.         Marland Kitchen. PROAIR HFA 108 (90 BASE) MCG/ACT inhaler   Inhalation  Inhale 1 puff into the lungs every 6 (six) hours as needed. For wheezing/shortness of breath         . rosuvastatin (CRESTOR) 10 MG tablet   Oral   Take 1 tablet (10 mg total) by mouth at bedtime.   31 tablet   0     Allergies Aricept; Codeine; and Ketek  No family history on file.  Social History History  Substance Use Topics  . Smoking status: Never Smoker   . Smokeless tobacco: Never Used  . Alcohol Use: No    Review of Systems Constitutional: Negative for fever. Cardiovascular: Positive for chest pain. Respiratory: Negative for shortness of breath. Gastrointestinal: Negative for abdominal pain, vomiting and diarrhea. Genitourinary: Foul odor to the urine Musculoskeletal: Negative for back pain. Skin: Negative for rash. Neurological: Altered.  10-point ROS otherwise negative.  ____________________________________________   PHYSICAL EXAM:  VITAL SIGNS: ED Triage Vitals  Enc Vitals Group     BP 09/20/14 1429 151/62  mmHg     Pulse Rate 09/20/14 1429 71     Resp 09/20/14 1429 18     Temp 09/20/14 1429 97.6 F (36.4 C)     Temp Source 09/20/14 1429 Oral     SpO2 09/20/14 1429 94 %     Weight 09/20/14 1429 186 lb (84.369 kg)     Height 09/20/14 1429 5' (1.524 m)     Head Cir --      Peak Flow --      Pain Score --      Pain Loc --      Pain Edu? --      Excl. in GC? --    Constitutional: Alert, awake, not oriented. Well appearing and in no distress. Eyes: Conjunctivae are normal. PERRL. Normal extraocular movements. ENT   Head: Normocephalic and atraumatic.   Nose: No congestion/rhinnorhea.   Mouth/Throat: Mucous membranes are moist.   Neck: No stridor. Hematological/Lymphatic/Immunilogical: No cervical lymphadenopathy. Cardiovascular: Normal rate, regular rhythm. Normal and symmetric distal pulses are present in all extremities. No murmurs, rubs, or gallops. Respiratory: Normal respiratory effort without tachypnea nor retractions. Breath sounds are clear and equal bilaterally. No wheezes/rales/rhonchi. Gastrointestinal: Soft and nontender. No distention. No abdominal bruits. Genitourinary: deferred Musculoskeletal: Normal range of motion in all extremities. Trace bilateral pedal edema. Neurologic:  Normal speech and language. No gross focal neurologic deficits are appreciated. Speech is normal. Altered Skin:  Skin is warm, dry and intact. No rash noted. ____________________________________________    LABS (pertinent positives/negatives)  Labs Reviewed  CBC - Abnormal; Notable for the following:    WBC 11.7 (*)    Hemoglobin 11.9 (*)    MCHC 31.6 (*)    All other components within normal limits  COMPREHENSIVE METABOLIC PANEL - Abnormal; Notable for the following:    Chloride 99 (*)    Glucose, Bld 101 (*)    BUN 23 (*)    Creatinine, Ser 1.01 (*)    Calcium 8.7 (*)    AST 14 (*)    ALT 9 (*)    GFR calc non Af Amer 48 (*)    GFR calc Af Amer 55 (*)    All other  components within normal limits  URINALYSIS COMPLETEWITH MICROSCOPIC ([facility])  - Abnormal; Notable for the following:    Color, Urine YELLOW (*)    APPearance TURBID (*)    Specific Gravity, Urine 1.002 (*)    Hgb urine dipstick 2+ (*)    Protein, ur 30 (*)  Leukocytes, UA 3+ (*)    Squamous Epithelial / LPF 6-30 (*)    All other components within normal limits  URINALYSIS COMPLETEWITH MICROSCOPIC ([facility])  - Abnormal; Notable for the following:    Color, Urine YELLOW (*)    APPearance TURBID (*)    Specific Gravity, Urine 1.002 (*)    Hgb urine dipstick 2+ (*)    Protein, ur 30 (*)    Leukocytes, UA 3+ (*)    Squamous Epithelial / LPF 6-30 (*)    All other components within normal limits  TROPONIN I  CBG MONITORING, ED     ____________________________________________   EKG  ED ECG REPORT   Date: 09/20/2014  EKG Time: 1427  Rate: 73  Rhythm: normal sinus rhythm, LBBB, 1st degree AV block  Axis: LAD  Intervals:first-degree A-V block  and left bundle branch block  ST&T Change: Does not meet Sgarbossa  ____________________________________________    RADIOLOGY   None ____________________________________________   PROCEDURES  Procedure(s) performed: None  Critical Care performed: No  ____________________________________________   INITIAL IMPRESSION / ASSESSMENT AND PLAN / ED COURSE  Pertinent labs & imaging results that were available during my care of the patient were reviewed by me and considered in my medical decision making (see chart for details).  She brought by family for confusion. Will check blood work and urine.  Urinalysis consistent with UTI. Discussed with family. We will get dose of antibiotics here and discharged with prescription.  ____________________________________________   FINAL CLINICAL IMPRESSION(S) / ED DIAGNOSES  Final diagnoses:  Delirium  Acute cystitis without hematuria    Phineas Semen, MD 09/21/14 1610

## 2014-09-20 NOTE — ED Notes (Signed)
Pt c/o substernal chest pain since yesterday with nausea.the patient is having a hard time finding the words to describe her discomfort..daughter states her mental status is not normal for her.the patient seems to have some confusion on arrival..

## 2014-09-20 NOTE — Discharge Instructions (Signed)
Please seek medical attention for any high fevers, chest pain, shortness of breath, persistent vomiting, or any other new or concerning symptoms.  Urinary Tract Infection Urinary tract infections (UTIs) can develop anywhere along your urinary tract. Your urinary tract is your body's drainage system for removing wastes and extra water. Your urinary tract includes two kidneys, two ureters, a bladder, and a urethra. Your kidneys are a pair of bean-shaped organs. Each kidney is about the size of your fist. They are located below your ribs, one on each side of your spine. CAUSES Infections are caused by microbes, which are microscopic organisms, including fungi, viruses, and bacteria. These organisms are so small that they can only be seen through a microscope. Bacteria are the microbes that most commonly cause UTIs. SYMPTOMS  Symptoms of UTIs may vary by age and gender of the patient and by the location of the infection. Symptoms in young women typically include a frequent and intense urge to urinate and a painful, burning feeling in the bladder or urethra during urination. Older women and men are more likely to be tired, shaky, and weak and have muscle aches and abdominal pain. A fever may mean the infection is in your kidneys. Other symptoms of a kidney infection include pain in your back or sides below the ribs, nausea, and vomiting. DIAGNOSIS To diagnose a UTI, your caregiver will ask you about your symptoms. Your caregiver also will ask to provide a urine sample. The urine sample will be tested for bacteria and white blood cells. White blood cells are made by your body to help fight infection. TREATMENT  Typically, UTIs can be treated with medication. Because most UTIs are caused by a bacterial infection, they usually can be treated with the use of antibiotics. The choice of antibiotic and length of treatment depend on your symptoms and the type of bacteria causing your infection. HOME CARE  INSTRUCTIONS  If you were prescribed antibiotics, take them exactly as your caregiver instructs you. Finish the medication even if you feel better after you have only taken some of the medication.  Drink enough water and fluids to keep your urine clear or pale yellow.  Avoid caffeine, tea, and carbonated beverages. They tend to irritate your bladder.  Empty your bladder often. Avoid holding urine for long periods of time.  Empty your bladder before and after sexual intercourse.  After a bowel movement, women should cleanse from front to back. Use each tissue only once. SEEK MEDICAL CARE IF:   You have back pain.  You develop a fever.  Your symptoms do not begin to resolve within 3 days. SEEK IMMEDIATE MEDICAL CARE IF:   You have severe back pain or lower abdominal pain.  You develop chills.  You have nausea or vomiting.  You have continued burning or discomfort with urination. MAKE SURE YOU:   Understand these instructions.  Will watch your condition.  Will get help right away if you are not doing well or get worse. Document Released: 02/14/2005 Document Revised: 11/06/2011 Document Reviewed: 06/15/2011 [facility] Patient Information 2015 [employer] [place]. This information is not intended to replace advice given to you by your health care provider. Make sure you discuss any questions you have with your health care provider.

## 2014-11-04 ENCOUNTER — Encounter: Payer: Self-pay | Admitting: *Deleted

## 2014-11-05 ENCOUNTER — Telehealth: Payer: Self-pay | Admitting: *Deleted

## 2014-11-05 ENCOUNTER — Other Ambulatory Visit: Payer: Self-pay

## 2014-11-05 ENCOUNTER — Ambulatory Visit (INDEPENDENT_AMBULATORY_CARE_PROVIDER_SITE_OTHER): Payer: PPO | Admitting: Urology

## 2014-11-05 ENCOUNTER — Encounter: Payer: Self-pay | Admitting: Urology

## 2014-11-05 VITALS — BP 118/64 | HR 70 | Ht 61.0 in | Wt 196.8 lb

## 2014-11-05 DIAGNOSIS — R312 Other microscopic hematuria: Secondary | ICD-10-CM | POA: Diagnosis not present

## 2014-11-05 DIAGNOSIS — N390 Urinary tract infection, site not specified: Secondary | ICD-10-CM | POA: Diagnosis not present

## 2014-11-05 DIAGNOSIS — R310 Gross hematuria: Secondary | ICD-10-CM

## 2014-11-05 DIAGNOSIS — R3129 Other microscopic hematuria: Secondary | ICD-10-CM

## 2014-11-05 DIAGNOSIS — N952 Postmenopausal atrophic vaginitis: Secondary | ICD-10-CM

## 2014-11-05 LAB — URINALYSIS, COMPLETE
BILIRUBIN UA: NEGATIVE
GLUCOSE, UA: NEGATIVE
Ketones, UA: NEGATIVE
Nitrite, UA: POSITIVE — AB
PH UA: 5.5 (ref 5.0–7.5)
SPEC GRAV UA: 1.015 (ref 1.005–1.030)
Urobilinogen, Ur: 0.2 mg/dL (ref 0.2–1.0)

## 2014-11-05 LAB — MICROSCOPIC EXAMINATION

## 2014-11-05 NOTE — Patient Instructions (Signed)
Patient was given a sample of vaginal estrogen cream and instructed to apply 0.5mg  (pea-sized amount)  just inside the vaginal introitus with a finger-tip every night for two weeks and then Monday, Wednesday and Friday nights.  I explained to the patient that vaginally administered estrogen, which causes only a slight increase in the blood estrogen levels, have fewer contraindications and adverse systemic effects that oral HT.

## 2014-11-05 NOTE — Telephone Encounter (Signed)
Called to schedule pt Renal U/S. The order that is in EPIC is not the correct order for the DX. The new order needs to be entered into EPIC. That order is Renal Ultrasound. Please advise MD

## 2014-11-05 NOTE — Progress Notes (Signed)
11/05/2014 9:59 AM   Daisy [name] 11/04/1921 [id number]  Referring provider: Kandyce Rud, MD [address] [facility] [place], [place] [address]  Chief Complaint  Patient presents with  . Recurrent uti's    (with Ecoli) referrral from Kandyce Rud MD    HPI: Daisy [name] is a 81 y/o white female with a h/o recurrent UTI's who is referred to [place] by her PCP, Dr. Larwance Sachs.  She has had >5 UTI's over the last year.  Her symptoms at the time of infections are mental confusion, foul smelling urine and malaise.  Currenlty, she lives with her daughter and the daughter is the sole caregiver.  She is a poor historian due to her dementia.  The daughter has not witnessed any fevers, chills, nausea, vomiting or suprapubic pain.  She also does not have a h/o stones or gross hematuria.  She is incontinent and wears depends.  Her daughter is very fastidious with her mother's perineal care.  She is washing the perineum several times a day in an effort to try to reduce the odor of the malodorous urine.    She had a +UCx for Kleb on 11/01/2014, +UCx on 10/12/2014, 08/12/2014, 07/27/2014 for E. Coli.  She does have constipation, but the daughter believes it is from all the antibiotics.  She has not had any diarrhea.  There have been no imaging studies.     PMH: Past Medical History  Diagnosis Date  . Other and unspecified hyperlipidemia 06/05/2013  . Essential hypertension, benign 06/05/2013  . Depression 06/05/2013  . GERD (gastroesophageal reflux disease) 06/05/2013  . Recurrent UTI   . Paronychia, toe   . Anemia   . Acute renal failure   . Cerebral artery occlusion     with cerebral infarction  . Dementia   . Asthma   . Edema   . Osteoarthritis   . Stroke   . Heart attack     Surgical History: Past Surgical History  Procedure Laterality Date  . Knee surgery Right   . Cardiac surgery      Left BBB    Home Medications:    Medication List       This list is accurate as of: 11/05/14  9:59 AM.  Always use your most recent med list.               acetaminophen 500 MG tablet  Commonly known as:  TYLENOL  Take 1,000 mg by mouth every 6 (six) hours as needed for moderate pain.     ALPRAZolam 0.5 MG tablet  Commonly known as:  XANAX  Take 0.5 mg by mouth at bedtime as needed for anxiety.     amLODipine 2.5 MG tablet  Commonly known as:  NORVASC  Take 1 tablet (2.5 mg total) by mouth daily.     aspirin 325 MG tablet  Take 1 tablet (325 mg total) by mouth daily.     carvedilol 12.5 MG tablet  Commonly known as:  COREG  Take 1 tablet (12.5 mg total) by mouth 2 (two) times daily with a meal.     cetirizine 10 MG tablet  Commonly known as:  ZYRTEC  Take 10 mg by mouth every evening.     citalopram 20 MG tablet  Commonly known as:  CELEXA  Take 1 tablet (20 mg total) by mouth daily.     ferrous sulfate 325 (65 FE) MG tablet  Take 325 mg by mouth  daily.     Fluticasone-Salmeterol 250-50 MCG/DOSE Aepb  Commonly known as:  ADVAIR  Inhale 1 puff into the lungs 2 (two) times daily.     furosemide 40 MG tablet  Commonly known as:  LASIX  Take 0.5 tablets (20 mg total) by mouth daily.     hydrocortisone cream 1 %  Apply 1 application topically 2 (two) times daily as needed (rash).     nitrofurantoin (macrocrystal-monohydrate) 100 MG capsule  Commonly known as:  MACROBID  Take by mouth.     omeprazole 20 MG capsule  Commonly known as:  PRILOSEC  Take 20 mg by mouth daily.     PROAIR HFA 108 (90 BASE) MCG/ACT inhaler  Generic drug:  albuterol  Inhale 1 puff into the lungs every 6 (six) hours as needed. For wheezing/shortness of breath     RA VITAMIN B-12 TR 1000 MCG Tbcr  Generic drug:  Cyanocobalamin  Take by mouth.     rosuvastatin 10 MG tablet  Commonly known as:  CRESTOR  Take 1 tablet (10 mg total) by mouth at bedtime.     simvastatin 20 MG tablet  Commonly known as:  ZOCOR  Take 20 mg by mouth  daily.     tiotropium 18 MCG inhalation capsule  Commonly known as:  SPIRIVA  Place 18 mcg into inhaler and inhale daily.        Allergies:  Allergies  Allergen Reactions  . Aricept [Donepezil Hcl] Other (See Comments)    "has nightmares"  . Codeine     REACTION: Pt. unsure.  Marland Kitchen Ketek [Telithromycin] Other (See Comments)    "fearful state, not herself"    Family History: Family History  Problem Relation Age of Onset  . Prostate cancer Son     Social History:  reports that she has quit smoking. She has never used smokeless tobacco. She reports that she does not drink alcohol or use illicit drugs.  Results for orders placed or performed in visit on 11/05/14  CULTURE, URINE COMPREHENSIVE  Result Value Ref Range   Urine Culture, Comprehensive Final report (A)    Result 1 Klebsiella pneumoniae (A)    ANTIMICROBIAL SUSCEPTIBILITY Comment   Microscopic Examination  Result Value Ref Range   WBC, UA >30 (A) 0 -  5 /hpf   RBC, UA 0-2 0 -  2 /hpf   Epithelial Cells (non renal) 0-10 0 - 10 /hpf   Bacteria, UA Many (A) None seen/Few  Urinalysis, Complete  Result Value Ref Range   Specific Gravity, UA 1.015 1.005 - 1.030   pH, UA 5.5 5.0 - 7.5   Color, UA Yellow Yellow   Appearance Ur Cloudy (A) Clear   Leukocytes, UA 3+ (A) Negative   Protein, UA 1+ (A) Negative/Trace   Glucose, UA Negative Negative   Ketones, UA Negative Negative   RBC, UA 1+ (A) Negative   Bilirubin, UA Negative Negative   Urobilinogen, Ur 0.2 0.2 - 1.0 mg/dL   Nitrite, UA Positive (A) Negative   Microscopic Examination See below:    ROS: Urological Symptom Review  Patient is experiencing the following symptoms: Hard to postpone urination Leakage of urine Urinary tract infection   Review of Systems  Gastrointestinal (upper)  : Nausea  Gastrointestinal (lower) : Constipation  Constitutional : Fatigue  Skin: Itching  Eyes: Negative for eye symptoms  Ear/Nose/Throat : Negative for  Ear/Nose/Throat symptoms  Hematologic/Lymphatic: Easy bruising  Cardiovascular : Leg swelling Chest pain  Respiratory : Cough Shortness  of breath  Endocrine: Negative for endocrine symptoms  Musculoskeletal: Joint pain  Neurological: Negative for neurological symptoms  Psychologic: Anxiety   Physical Exam: BP 118/64 mmHg  Pulse 70  Ht 5\' 1"  (1.549 m)  Wt 196 lb 12.8 oz (89.268 kg)  BMI 37.20 kg/m2  Constitutional:  Alert and oriented, No acute distress. HEENT: [place] AT, moist mucus membranes.  Trachea midline, no masses. Cardiovascular: No clubbing, cyanosis, or edema. Respiratory: Normal respiratory effort, no increased work of breathing. GI: Abdomen is soft, non tender, non distended, no abdominal masses GU: No CVA tenderness.   Erythematous external genitalia.  Normal urethral meatus. No urethral masses and/or tenderness. No bladder fullness or masses. Atrophic vaginal tissues, no vaginal lesions or discharge. Rectocele is noted. Erythematous anus and perineum. Skin: No rashes, bruises or suspicious lesions. Lymph: No cervical or inguinal adenopathy. Neurologic: Grossly intact, no focal deficits, moving all 4 extremities. Psychiatric: Normal mood and affect.  Laboratory Data: Results for orders placed or performed in visit on 11/05/14  Microscopic Examination  Result Value Ref Range   WBC, UA >30 (A) 0 -  5 /hpf   RBC, UA 0-2 0 -  2 /hpf   Epithelial Cells (non renal) 0-10 0 - 10 /hpf   Bacteria, UA Many (A) None seen/Few  Urinalysis, Complete  Result Value Ref Range   Specific Gravity, UA 1.015 1.005 - 1.030   pH, UA 5.5 5.0 - 7.5   Color, UA Yellow Yellow   Appearance Ur Cloudy (A) Clear   Leukocytes, UA 3+ (A) Negative   Protein, UA 1+ (A) Negative/Trace   Glucose, UA Negative Negative   Ketones, UA Negative Negative   RBC, UA 1+ (A) Negative   Bilirubin, UA Negative Negative   Urobilinogen, Ur 0.2 0.2 - 1.0 mg/dL   Nitrite, UA Positive (A) Negative     Microscopic Examination See below:    Lab Results  Component Value Date   WBC 11.7* 09/20/2014   HGB 11.9* 09/20/2014   HCT 37.6 09/20/2014   MCV 90.6 09/20/2014   PLT 231 09/20/2014    Lab Results  Component Value Date   CREATININE 1.01* 09/20/2014    No results found for: PSA  No results found for: TESTOSTERONE  Lab Results  Component Value Date   HGBA1C 6.1* 06/05/2013    Urinalysis    Component Value Date/Time   COLORURINE YELLOW* 09/20/2014 1652   APPEARANCEUR TURBID* 09/20/2014 1652   LABSPEC 1.002* 09/20/2014 1652   PHURINE 7.0 09/20/2014 1652   GLUCOSEU NEGATIVE 09/20/2014 1652   HGBUR 2+* 09/20/2014 1652   BILIRUBINUR NEGATIVE 09/20/2014 1652   KETONESUR NEGATIVE 09/20/2014 1652   PROTEINUR 30* 09/20/2014 1652   UROBILINOGEN 0.2 06/04/2013 2110   NITRITE NEGATIVE 09/20/2014 1652   LEUKOCYTESUR 3+* 09/20/2014 1652    Pertinent Imaging: Procedure:  In and Out Catheterization  Patient is present today for a I & O catheterization due to recurrent UTI's. Patient was cleaned and prepped in a sterile fashion with betadine and Lidocaine 2% jelly was instilled into the urethra.  A 16FR cath was inserted no complications were noted , 70 ml of urine return was noted, urine was yellow, cloudy in color. A clean urine sample was collected for culture. Bladder was drained  And catheter was removed with out difficulty.    Preformed by: Michiel Cowboy, PA-C    Assessment & Plan:    1. Recurrent UTI-  Patient was cathed for a specimen for repeat culture today.  Her UA was nitrite + with >30WBC's/hpf and many bacteria  today.  She is currently on Macrobid for an +UCx for Kleb.  Completed on 11/01/2014.  It had Intermediate susceptibility to the Macrobid, but I would like to wait on the newer sensitivies before changing antibiotics.  We will also schedule her for a KUB and RUS.  She will follow up for the reports.   - Urinalysis,  - CULTURE, URINE COMPREHENSIVE -  BLADDER SCAN AMB NON-IMAGING  2. Atrophic vaginitis-  Patient was given a sample of vaginal estrogen cream (Estrace) and instructed to apply 0.5mg  (pea-sized amount)  just inside the vaginal introitus with a finger-tip every night for two weeks and then Monday, Wednesday and Friday nights.  I explained to the patient that vaginally administered estrogen, which causes only a slight increase in the blood estrogen levels, have fewer contraindications and adverse systemic effects that oral HT.  3. Skin breakdown-  Patient will the starting of skin break down in the perineal area.  Advised to use skin barrier lotions.    4. Microscopic hematuria-   Patient with 6-30RBC's/hpf on 09/20/2014 in the presence of infection.  We will continue to monitor with cath specimens.  She did not have micro heme on today's exam.  Her daughter denies any gross hematuria.   No Follow-up on file.  Michiel Cowboy, PA-C  [facility] [address] [place], [place] [address] [phone]

## 2014-11-05 NOTE — Telephone Encounter (Signed)
I tried to put in the correct order, see if this works.

## 2014-11-07 LAB — CULTURE, URINE COMPREHENSIVE

## 2014-11-08 ENCOUNTER — Telehealth: Payer: Self-pay

## 2014-11-08 DIAGNOSIS — N390 Urinary tract infection, site not specified: Secondary | ICD-10-CM

## 2014-11-08 MED ORDER — AMOXICILLIN-POT CLAVULANATE 875-125 MG PO TABS: 1.0000 | ORAL_TABLET | Freq: Two times a day (BID) | ORAL | Status: AC

## 2014-11-08 NOTE — Telephone Encounter (Signed)
-----   Message from Harle Battiest, PA-C sent at 11/07/2014  8:40 PM EDT ----- Patient has a +UCx.  Patient will need to stop the Macrobid and  start Augmentin 875/125 mg one tablet twice daily for seven days and then we need to check a cath specimen in 3 to 5 days.

## 2014-11-08 NOTE — Telephone Encounter (Signed)
LMOM. Medication already called into pharmacy.

## 2014-11-08 NOTE — Telephone Encounter (Signed)
Spoke with pt made in reference to pt UTI. Daughter stated she would go pick up medication. Daughter also stated she will call back for cath specimen appt. Cw,lpn

## 2014-11-08 NOTE — Telephone Encounter (Signed)
-----   Message from Shannon A McGowan, PA-C sent at 11/07/2014  8:40 PM EDT ----- Patient has a +UCx.  Patient will need to stop the Macrobid and  start Augmentin 875/125 mg one tablet twice daily for seven days and then we need to check a cath specimen in 3 to 5 days. 

## 2014-11-12 DIAGNOSIS — R3129 Other microscopic hematuria: Secondary | ICD-10-CM | POA: Insufficient documentation

## 2014-11-12 DIAGNOSIS — N952 Postmenopausal atrophic vaginitis: Secondary | ICD-10-CM | POA: Insufficient documentation

## 2014-11-12 DIAGNOSIS — N390 Urinary tract infection, site not specified: Secondary | ICD-10-CM | POA: Insufficient documentation

## 2014-11-15 ENCOUNTER — Ambulatory Visit
Admission: RE | Admit: 2014-11-15 | Discharge: 2014-11-15 | Disposition: A | Discharge status: Home / Self Care | Payer: PPO | Source: Ambulatory Visit | Attending: Urology | Admitting: Urology

## 2014-11-15 DIAGNOSIS — R310 Gross hematuria: Secondary | ICD-10-CM | POA: Insufficient documentation

## 2014-11-17 ENCOUNTER — Ambulatory Visit: Payer: PPO

## 2014-11-18 ENCOUNTER — Ambulatory Visit: Payer: PPO

## 2014-11-18 ENCOUNTER — Other Ambulatory Visit: Payer: PPO

## 2014-11-19 NOTE — Telephone Encounter (Signed)
Patient's daughter, Blima Singeratsy Phillips, called today requesting US results for the patient.  Patient is 81 years old and difficult to get in and out of the house.    Please advise if you want the patient to come in to the office for results.

## 2014-11-21 ENCOUNTER — Emergency Department
Admission: EM | Admit: 2014-11-21 | Discharge: 2014-11-21 | Disposition: A | Discharge status: Home / Self Care | Payer: PPO | Attending: Emergency Medicine | Admitting: Emergency Medicine

## 2014-11-21 DIAGNOSIS — Z7982 Long term (current) use of aspirin: Secondary | ICD-10-CM | POA: Diagnosis not present

## 2014-11-21 DIAGNOSIS — Z79899 Other long term (current) drug therapy: Secondary | ICD-10-CM | POA: Diagnosis not present

## 2014-11-21 DIAGNOSIS — Z87891 Personal history of nicotine dependence: Secondary | ICD-10-CM | POA: Insufficient documentation

## 2014-11-21 DIAGNOSIS — I10 Essential (primary) hypertension: Secondary | ICD-10-CM | POA: Insufficient documentation

## 2014-11-21 DIAGNOSIS — N390 Urinary tract infection, site not specified: Secondary | ICD-10-CM

## 2014-11-21 DIAGNOSIS — Z7951 Long term (current) use of inhaled steroids: Secondary | ICD-10-CM | POA: Diagnosis not present

## 2014-11-21 DIAGNOSIS — R350 Frequency of micturition: Secondary | ICD-10-CM | POA: Diagnosis present

## 2014-11-21 DIAGNOSIS — Z792 Long term (current) use of antibiotics: Secondary | ICD-10-CM | POA: Diagnosis not present

## 2014-11-21 LAB — URINALYSIS COMPLETE WITH MICROSCOPIC (ARMC ONLY)
Bilirubin Urine: NEGATIVE
Glucose, UA: NEGATIVE mg/dL
Ketones, ur: NEGATIVE mg/dL
NITRITE: NEGATIVE
PH: 5 (ref 5.0–8.0)
PROTEIN: NEGATIVE mg/dL
Specific Gravity, Urine: 1.005 (ref 1.005–1.030)
Squamous Epithelial / LPF: NONE SEEN

## 2014-11-21 LAB — CBC WITH DIFFERENTIAL/PLATELET
BASOS PCT: 1 %
Basophils Absolute: 0.1 10*3/uL (ref 0–0.1)
EOS PCT: 5 %
Eosinophils Absolute: 0.5 10*3/uL (ref 0–0.7)
HCT: 37 % (ref 35.0–47.0)
Hemoglobin: 11.7 g/dL — ABNORMAL LOW (ref 12.0–16.0)
Lymphocytes Relative: 16 %
Lymphs Abs: 1.5 10*3/uL (ref 1.0–3.6)
MCH: 29 pg (ref 26.0–34.0)
MCHC: 31.7 g/dL — AB (ref 32.0–36.0)
MCV: 91.3 fL (ref 80.0–100.0)
Monocytes Absolute: 1 10*3/uL — ABNORMAL HIGH (ref 0.2–0.9)
Monocytes Relative: 11 %
Neutro Abs: 6.1 10*3/uL (ref 1.4–6.5)
Neutrophils Relative %: 67 %
Platelets: 249 10*3/uL (ref 150–440)
RBC: 4.05 MIL/uL (ref 3.80–5.20)
RDW: 15.6 % — AB (ref 11.5–14.5)
WBC: 9.2 10*3/uL (ref 3.6–11.0)

## 2014-11-21 LAB — COMPREHENSIVE METABOLIC PANEL
ALBUMIN: 3.5 g/dL (ref 3.5–5.0)
ALK PHOS: 58 U/L (ref 38–126)
ALT: 9 U/L — ABNORMAL LOW (ref 14–54)
ANION GAP: 8 (ref 5–15)
AST: 15 U/L (ref 15–41)
BUN: 18 mg/dL (ref 6–20)
CALCIUM: 8.3 mg/dL — AB (ref 8.9–10.3)
CO2: 32 mmol/L (ref 22–32)
CREATININE: 1.32 mg/dL — AB (ref 0.44–1.00)
Chloride: 97 mmol/L — ABNORMAL LOW (ref 101–111)
GFR calc Af Amer: 40 mL/min — ABNORMAL LOW (ref >60)
GFR calc non Af Amer: 34 mL/min — ABNORMAL LOW (ref >60)
Glucose, Bld: 120 mg/dL — ABNORMAL HIGH (ref 65–99)
Potassium: 3.6 mmol/L (ref 3.5–5.1)
Sodium: 137 mmol/L (ref 135–145)
Total Bilirubin: 0.6 mg/dL (ref 0.3–1.2)
Total Protein: 6.5 g/dL (ref 6.5–8.1)

## 2014-11-21 MED ORDER — CEFTRIAXONE SODIUM 1 G IJ SOLR
500.0000 mg | Freq: Once | INTRAMUSCULAR | Status: AC
  Administered 2014-11-21: 500 mg via INTRAMUSCULAR

## 2014-11-21 MED ORDER — AMOXICILLIN-POT CLAVULANATE 875-125 MG PO TABS
ORAL_TABLET | ORAL | Status: AC
  Administered 2014-11-21: 1 via ORAL
  Filled 2014-11-21: qty 1

## 2014-11-21 MED ORDER — AMOXICILLIN-POT CLAVULANATE 875-125 MG PO TABS
1.0000 | ORAL_TABLET | Freq: Once | ORAL | Status: AC
Start: 2014-11-21 — End: 2014-11-21
  Administered 2014-11-21: 1 via ORAL

## 2014-11-21 MED ORDER — CEFTRIAXONE SODIUM 1 G IJ SOLR
INTRAMUSCULAR | Status: AC
  Administered 2014-11-21: 500 mg via INTRAMUSCULAR
  Filled 2014-11-21: qty 10

## 2014-11-21 MED ORDER — CEFTRIAXONE SODIUM 250 MG IJ SOLR
INTRAMUSCULAR | Status: AC
  Filled 2014-11-21: qty 500

## 2014-11-21 MED ORDER — AMOXICILLIN-POT CLAVULANATE 875-125 MG PO TABS: 1.0000 | ORAL_TABLET | Freq: Two times a day (BID) | ORAL | Status: AC

## 2014-11-21 NOTE — ED Provider Notes (Signed)
[facility] Emergency Department Provider Note  ____________________________________________  Time seen: 1830  I have reviewed the triage vital signs and the nursing notes.  Limited history due to the patient's dementia. She is alert and verbal but confused  HISTORY  Chief Complaint Urinary Frequency  recurrent urinary tract infections    HPI Daisy [name] is a 81 y.o. female with dementia. She has been having recurrent urinary tract infections. She just finished taking Augmentin and yet she still has signs and symptoms of a urinary tract infection. The family reports she continues to urinate more frequently. They report that the urine has fell odor and that they noted a trace of blood in the patient's underwear.  Further history from the patient's daughter reports that she has seen [facility] urology about this problem. She had an ultrasound here at the hospital recently (this was a renal ultrasound that had a negative result).    The daughter is concerned because the patient had improved with the Augmentin, was well for approximate 4-5 days, but then began to have symptoms again.     Past Medical History  Diagnosis Date  . Other and unspecified hyperlipidemia 06/05/2013  . Essential hypertension, benign 06/05/2013  . Depression 06/05/2013  . GERD (gastroesophageal reflux disease) 06/05/2013  . Recurrent UTI   . Paronychia, toe   . Anemia   . Acute renal failure   . Cerebral artery occlusion     with cerebral infarction  . Dementia   . Asthma   . Edema   . Osteoarthritis   . Stroke   . Heart attack     Patient Active Problem List   Diagnosis Date Noted  . Recurrent UTI 11/12/2014  . Microscopic hematuria 11/12/2014  . Atrophic vaginitis 11/12/2014  . CVA (cerebral infarction) 06/05/2013  . Hypotension, unspecified 06/05/2013  . Other and unspecified hyperlipidemia 06/05/2013  . Acute renal failure 06/05/2013  . Leukocytosis, unspecified  06/05/2013  . Essential hypertension, benign 06/05/2013  . Depression 06/05/2013  . GERD (gastroesophageal reflux disease) 06/05/2013    Past Surgical History  Procedure Laterality Date  . Knee surgery Right   . Cardiac surgery      Left BBB    Current Outpatient Rx  Name  Route  Sig  Dispense  Refill  . acetaminophen (TYLENOL) 500 MG tablet   Oral   Take 1,000 mg by mouth every 6 (six) hours as needed for moderate pain.         Marland Kitchen ALPRAZolam (XANAX) 0.5 MG tablet   Oral   Take 0.5 mg by mouth at bedtime as needed for anxiety.         Marland Kitchen amLODipine (NORVASC) 2.5 MG tablet   Oral   Take 1 tablet (2.5 mg total) by mouth daily.   31 tablet   0   . amoxicillin-clavulanate (AUGMENTIN) 875-125 MG per tablet   Oral   Take 1 tablet by mouth 2 (two) times daily.   20 tablet   0   . aspirin 325 MG tablet   Oral   Take 1 tablet (325 mg total) by mouth daily. Patient not taking: Reported on 11/05/2014         . carvedilol (COREG) 12.5 MG tablet   Oral   Take 1 tablet (12.5 mg total) by mouth 2 (two) times daily with a meal.   62 tablet   0   . cetirizine (ZYRTEC) 10 MG tablet   Oral   Take 10 mg by  mouth every evening.         . citalopram (CELEXA) 20 MG tablet   Oral   Take 1 tablet (20 mg total) by mouth daily.   31 tablet   0   . Cyanocobalamin (RA VITAMIN B-12 TR) 1000 MCG TBCR   Oral   Take by mouth.         . ferrous sulfate 325 (65 FE) MG tablet   Oral   Take 325 mg by mouth daily.          . Fluticasone-Salmeterol (ADVAIR) 250-50 MCG/DOSE AEPB   Inhalation   Inhale 1 puff into the lungs 2 (two) times daily.         . furosemide (LASIX) 40 MG tablet   Oral   Take 0.5 tablets (20 mg total) by mouth daily.   30 tablet   0   . hydrocortisone cream 1 %   Topical   Apply 1 application topically 2 (two) times daily as needed (rash).         Marland Kitchen. omeprazole (PRILOSEC) 20 MG capsule   Oral   Take 20 mg by mouth daily.         Marland Kitchen. PROAIR  HFA 108 (90 BASE) MCG/ACT inhaler   Inhalation   Inhale 1 puff into the lungs every 6 (six) hours as needed. For wheezing/shortness of breath         . rosuvastatin (CRESTOR) 10 MG tablet   Oral   Take 1 tablet (10 mg total) by mouth at bedtime. Patient not taking: Reported on 11/05/2014   31 tablet   0   . simvastatin (ZOCOR) 20 MG tablet   Oral   Take 20 mg by mouth daily.         Marland Kitchen. tiotropium (SPIRIVA) 18 MCG inhalation capsule   Inhalation   Place 18 mcg into inhaler and inhale daily.           Allergies Aricept; Codeine; and Ketek  Family History  Problem Relation Age of Onset  . Prostate cancer Son     Social History History  Substance Use Topics  . Smoking status: Former Games developermoker  . Smokeless tobacco: Never Used     Comment: quit 50 + years  . Alcohol Use: No    Review of Systems Very limited review of systems due to the patient's dementia and Limited communication. Per the family: Constitutional: Negative for fever. Genitourinary: Negative for dysuria. Skin: Negative for rash. Neurological: Change in alertness and interactivity.   ____________________________________________   PHYSICAL EXAM:  VITAL SIGNS: ED Triage Vitals  Enc Vitals Group     BP 11/21/14 1601 110/53 mmHg     Pulse Rate 11/21/14 1601 69     Resp 11/21/14 1601 20     Temp 11/21/14 1601 97.9 F (36.6 C)     Temp Source 11/21/14 1601 Oral     SpO2 11/21/14 1601 93 %     Weight 11/21/14 1601 200 lb (90.719 kg)     Height 11/21/14 1601 5\' 1"  (1.549 m)     Head Cir --      Peak Flow --      Pain Score 11/21/14 1611 0     Pain Loc --      Pain Edu? --      Excl. in GC? --     Constitutional:  Alert, confused, no acute distress. ENT   Head: Normocephalic and atraumatic.   Nose: No congestion/rhinnorhea. Cardiovascular: Normal rate, regular  rhythm, no murmur noted Respiratory:  Normal respiratory effort, no tachypnea.    Breath sounds are clear and equal  bilaterally.  Gastrointestinal: Soft and nontender. No distention.  Back: No muscle spasm, no tenderness, no CVA tenderness. Musculoskeletal: No deformity noted. Nontender with normal range of motion in all extremities.  No noted edema. Neurologic:  Verbal but confused. Moves all 4 extremities. Skin:  Skin is warm, dry. No rash noted. Psychiatric: Verbal but confused. Able to tell me the mail in the room is her son (this was accurate). ____________________________________________    LABS (pertinent positives/negatives)  Labs Reviewed  URINALYSIS COMPLETEWITH MICROSCOPIC ([facility] ONLY) - Abnormal; Notable for the following:    Color, Urine YELLOW (*)    APPearance CLOUDY (*)    Hgb urine dipstick 1+ (*)    Leukocytes, UA 3+ (*)    Bacteria, UA FEW (*)    All other components within normal limits  CBC WITH DIFFERENTIAL/PLATELET - Abnormal; Notable for the following:    Hemoglobin 11.7 (*)    MCHC 31.7 (*)    RDW 15.6 (*)    Monocytes Absolute 1.0 (*)    All other components within normal limits  COMPREHENSIVE METABOLIC PANEL - Abnormal; Notable for the following:    Chloride 97 (*)    Glucose, Bld 120 (*)    Creatinine, Ser 1.32 (*)    Calcium 8.3 (*)    ALT 9 (*)    GFR calc non Af Amer 34 (*)    GFR calc Af Amer 40 (*)    All other components within normal limits     ____________________________________________   INITIAL IMPRESSION / ASSESSMENT AND PLAN / ED COURSE  Pertinent labs & imaging results that were available during my care of the patient were reviewed by me and considered in my medical decision making (see chart for details).  81 year old female with a history of recurrent urinary tract infections. The last urine culture from June 2016 was reviewed. It showed up and sensitive species. The daughter reports that the mother had been doing better for 4-5 days before the return of symptoms. I have discussed with her the possibility of recurrent infection due to  contamination from either the outside, external source, or the less likely but possible chance of a fistula. We will refer the patient back to [facility] urology for ongoing care. I will restart her on the Augmentin which seemed to clear things up initially. The daughter would like [place] to treat the patient with an initial injection. We will treat her with ceftriaxone 1 g IM.  ____________________________________________   FINAL CLINICAL IMPRESSION(S) / ED DIAGNOSES  Final diagnoses:  Recurrent UTI      Darien Ramus, MD 11/21/14 [phone]

## 2014-11-21 NOTE — Discharge Instructions (Signed)
Restart the Augmentin which appeared to work initially.  [facility]. Return to the emergency department if you have fever, pain, or other urgent concerns.  Urinary Tract Infection A urinary tract infection (UTI) can occur any place along the urinary tract. The tract includes the kidneys, ureters, bladder, and urethra. A type of germ called bacteria often causes a UTI. UTIs are often helped with antibiotic medicine.  HOME CARE   If given, take antibiotics as told by your doctor. Finish them even if you start to feel better.  Drink enough fluids to keep your pee (urine) clear or pale yellow.  Avoid tea, drinks with caffeine, and bubbly (carbonated) drinks.  Pee often. Avoid holding your pee in for a long time.  Pee before and after having sex (intercourse).  Wipe from front to back after you poop (bowel movement) if you are a woman. Use each tissue only once. GET HELP RIGHT AWAY IF:   You have back pain.  You have lower belly (abdominal) pain.  You have chills.  You feel sick to your stomach (nauseous).  You throw up (vomit).  Your burning or discomfort with peeing does not go away.  You have a fever.  Your symptoms are not better in 3 days. MAKE SURE YOU:   Understand these instructions.  Will watch your condition.  Will get help right away if you are not doing well or get worse. Document Released: 10/24/2007 Document Revised: 01/30/2012 Document Reviewed: 12/06/2011 [facility] Patient Information 2015 [place], [place]. This information is not intended to replace advice given to you by your health care provider. Make sure you discuss any questions you have with your health care provider.

## 2014-11-21 NOTE — Telephone Encounter (Signed)
RUS is normal.  I do not see the results for the KUB.  Did she get an x-ray?

## 2014-11-21 NOTE — Telephone Encounter (Signed)
CLINICAL DATA: Gross hematuria  EXAM: RENAL / URINARY TRACT ULTRASOUND COMPLETE  COMPARISON: None.  FINDINGS: Right Kidney:  Length: 8.8 cm. Normal renal cortical thickness and echogenicity for age without focal lesions or hydronephrosis. No definite renal calculi.  Left Kidney:  Length: 9.0 cm. Normal renal cortical thickness and echogenicity for age without focal lesions or hydronephrosis. No definite renal calculi.  Bladder:  Appears normal for degree of bladder distention.  IMPRESSION: Unremarkable renal ultrasound examination.   Electronically Signed  By: Rudie MeyerP. Gallerani M.D.  On: 11/15/2014 16:10

## 2014-11-21 NOTE — ED Notes (Signed)
Continues to have uti s/s despite finishing her antibiotic amoxicillin-clav. Daughter states that this has been going on for months, she is more confused, c/o foul odor to urine, trace of blood in her underwear, some pain with urination.

## 2014-11-23 NOTE — Telephone Encounter (Signed)
Sent Information to Glenice BowLisa Hartley to schedule patient an appointment for followup from the ER.

## 2014-11-23 NOTE — Telephone Encounter (Signed)
Spoke with patient's daughter.  She did not get an x-ray.  However, she was seen in the ER on 7/3.  Please take a look at her records and advise if you have any recommendation for her care.

## 2014-11-23 NOTE — Telephone Encounter (Signed)
The ED physician advised them to follow up with us.  I agree.

## 2014-11-25 ENCOUNTER — Ambulatory Visit: Payer: PPO | Admitting: Urology

## 2014-11-25 ENCOUNTER — Telehealth: Payer: Self-pay | Admitting: Emergency Medicine

## 2014-11-25 NOTE — ED Notes (Signed)
Daughter called for urine culture results.  I was unable to see a culture order on chart and explained to daughter.  She said she understood one was being done.  She has appt eith urologist tomorrow.  She will advise urologist that culture was not done.

## 2014-11-26 ENCOUNTER — Encounter: Payer: Self-pay | Admitting: Urology

## 2014-11-26 ENCOUNTER — Ambulatory Visit (INDEPENDENT_AMBULATORY_CARE_PROVIDER_SITE_OTHER): Payer: PPO | Admitting: Urology

## 2014-11-26 VITALS — BP 146/82 | HR 73 | Ht 61.0 in | Wt 200.0 lb

## 2014-11-26 DIAGNOSIS — B3749 Other urogenital candidiasis: Secondary | ICD-10-CM

## 2014-11-26 DIAGNOSIS — N390 Urinary tract infection, site not specified: Secondary | ICD-10-CM

## 2014-11-26 DIAGNOSIS — N952 Postmenopausal atrophic vaginitis: Secondary | ICD-10-CM

## 2014-11-26 LAB — MICROSCOPIC EXAMINATION: BACTERIA UA: NONE SEEN

## 2014-11-26 LAB — URINALYSIS, COMPLETE
BILIRUBIN UA: NEGATIVE
GLUCOSE, UA: NEGATIVE
KETONES UA: NEGATIVE
Leukocytes, UA: NEGATIVE
Nitrite, UA: NEGATIVE
PROTEIN UA: NEGATIVE
RBC UA: NEGATIVE
Specific Gravity, UA: 1.015 (ref 1.005–1.030)
Urobilinogen, Ur: 0.2 mg/dL (ref 0.2–1.0)
pH, UA: 6.5 (ref 5.0–7.5)

## 2014-11-26 MED ORDER — NYSTATIN 100000 UNIT/GM EX CREA: 1.0000 "application " | TOPICAL_CREAM | Freq: Two times a day (BID) | CUTANEOUS | Status: DC

## 2014-11-26 MED ORDER — FLUCONAZOLE 150 MG PO TABS: 150.0000 mg | ORAL_TABLET | Freq: Once | ORAL | Status: DC

## 2014-11-26 NOTE — Progress Notes (Signed)
In and Out Catheterization  Patient is present today for a I & O catheterization due to UTI. Patient was cleaned and prepped in a sterile fashion with betadine and Lidocaine 2% jelly was instilled into the urethra.  A 14FR cath was inserted no complications were noted , 250ml of urine return was noted, urine was yellow in color. A clean urine sample was collected for urine culture. Bladder was drained  And catheter was removed with out difficulty.    Preformed by: Eligha BridegroomSarah Watts, CMA  Follow up/ Additional notes: Patient was noted to be very raw with redness and yeast present with some blood noted on the outside of the vagina and pelvic/ thigh area.

## 2014-11-26 NOTE — Progress Notes (Signed)
11/26/2014 9:06 AM   Daisy [name] 1921-08-11 [id number]  Referring provider: Kandyce Rud, MD [address] [place], [place] [address]  Chief Complaint  Patient presents with  . Follow-up    from ER for recurrent UTI.  Home needs order for cream sent back with her from ER    HPI: 81 year old woman with baseline dementia seen in our office by Michiel Cowboy just a few weeks ago for recurrent urinary tract infections, confusion, AMS, increased urinary frequency, and foul smelling urine.  During that visit, she was straight cathetered for urine sample which grew Klebsiella resistent to ampicillin, nitrofurantoin, cipro, and zosyn.    Her infection was sensitive to Augmentin which she was prescribed.  Her daughter states that she was doing better by after her antibiotics course was completed, she started to get more confused again after 2 or 3 days.  Her last visit, she is been seen again in the emergency room on 11/21/14 (restarted on Augmentin no culture).  She also has developed a worsening of her inner thigh/ labial excoriation and itching.  Today, she needs an order for her nursing home to be able to use some diaper rash cream given in the ED and to repeat the urine culture to ensure that her infection has cleared.  Her daughter also states that she does not think the nursing home is using the Estrace cream either.    No fevers or chills. No nausea or vomiting. Her mental status remains fairly poor (worse than baseline) today despite antibiotics.    PMH: Past Medical History  Diagnosis Date  . Other and unspecified hyperlipidemia 06/05/2013  . Essential hypertension, benign 06/05/2013  . Depression 06/05/2013  . GERD (gastroesophageal reflux disease) 06/05/2013  . Recurrent UTI   . Paronychia, toe   . Anemia   . Acute renal failure   . Cerebral artery occlusion     with cerebral infarction  . Dementia   . Asthma   . Edema   . Osteoarthritis   . Stroke   . Heart attack      Surgical History: Past Surgical History  Procedure Laterality Date  . Knee surgery Right   . Cardiac surgery      Left BBB    Home Medications:    Medication List       This list is accurate as of: 11/26/14 11:59 PM.  Always use your most recent med list.               acetaminophen 500 MG tablet  Commonly known as:  TYLENOL  Take 1,000 mg by mouth every 6 (six) hours as needed for moderate pain.     ALPRAZolam 0.5 MG tablet  Commonly known as:  XANAX  Take 0.5 mg by mouth at bedtime as needed for anxiety.     amLODipine 2.5 MG tablet  Commonly known as:  NORVASC  Take 1 tablet (2.5 mg total) by mouth daily.     amoxicillin-clavulanate 875-125 MG per tablet  Commonly known as:  AUGMENTIN  Take 1 tablet by mouth 2 (two) times daily.     aspirin 325 MG tablet  Take 1 tablet (325 mg total) by mouth daily.     carvedilol 12.5 MG tablet  Commonly known as:  COREG  Take 1 tablet (12.5 mg total) by mouth 2 (two) times daily with a meal.     cetirizine 10 MG tablet  Commonly known as:  ZYRTEC  Take 10 mg by mouth  every evening.     citalopram 20 MG tablet  Commonly known as:  CELEXA  Take 1 tablet (20 mg total) by mouth daily.     ferrous sulfate 325 (65 FE) MG tablet  Take 325 mg by mouth daily.     fluconazole 150 MG tablet  Commonly known as:  DIFLUCAN  Take 1 tablet (150 mg total) by mouth once.     Fluticasone-Salmeterol 250-50 MCG/DOSE Aepb  Commonly known as:  ADVAIR  Inhale 1 puff into the lungs 2 (two) times daily.     furosemide 40 MG tablet  Commonly known as:  LASIX  Take 0.5 tablets (20 mg total) by mouth daily.     hydrocortisone cream 1 %  Apply 1 application topically 2 (two) times daily as needed (rash).     nystatin cream  Commonly known as:  MYCOSTATIN  Apply 1 application topically 2 (two) times daily.     omeprazole 20 MG capsule  Commonly known as:  PRILOSEC  Take 20 mg by mouth daily.     PROAIR HFA 108 (90 BASE) MCG/ACT  inhaler  Generic drug:  albuterol  Inhale 1 puff into the lungs every 6 (six) hours as needed. For wheezing/shortness of breath     RA VITAMIN B-12 TR 1000 MCG Tbcr  Generic drug:  Cyanocobalamin  Take by mouth.     rosuvastatin 10 MG tablet  Commonly known as:  CRESTOR  Take 1 tablet (10 mg total) by mouth at bedtime.     simvastatin 20 MG tablet  Commonly known as:  ZOCOR  Take 20 mg by mouth daily.     tiotropium 18 MCG inhalation capsule  Commonly known as:  SPIRIVA  Place 18 mcg into inhaler and inhale daily.        Allergies:  Allergies  Allergen Reactions  . Aricept [Donepezil Hcl] Other (See Comments)    "has nightmares"  . Codeine     REACTION: Pt. unsure.  Marland Kitchen. Ketek [Telithromycin] Other (See Comments)    "fearful state, not herself"    Family History: Family History  Problem Relation Age of Onset  . Prostate cancer Son   . Kidney disease Neg Hx   . Bladder Cancer Neg Hx   . Cancer Mother     Oral  . Colon cancer Sister     Social History:  reports that she has quit smoking. She has never used smokeless tobacco. She reports that she drinks alcohol. She reports that she does not use illicit drugs.  ROS: UROLOGY Frequent Urination?: No Hard to postpone urination?: No Burning/pain with urination?: No Get up at night to urinate?: No Leakage of urine?: No Urine stream starts and stops?: No Trouble starting stream?: No Do you have to strain to urinate?: No Blood in urine?: No Urinary tract infection?: Yes Sexually transmitted disease?: No Injury to kidneys or bladder?: No Painful intercourse?: No Weak stream?: No Currently pregnant?: No Vaginal bleeding?: No Last menstrual period?: n Gastrointestinal Nausea?: No Vomiting?: No Indigestion/heartburn?: Yes Diarrhea?: No Constipation?: No Constitutional Fever: No Night sweats?: No Weight loss?: No Fatigue?: No Skin Skin rash/lesions?: Yes Itching?: Yes Eyes Blurred vision?: No Double  vision?: No Ears/Nose/Throat Sore throat?: No Sinus problems?: No Hematologic/Lymphatic Swollen glands?: No Easy bruising?: Yes Cardiovascular Leg swelling?: Yes Chest pain?: No Respiratory Cough?: No Shortness of breath?: Yes Endocrine Excessive thirst?: No Musculoskeletal Back pain?: No Joint pain?: No Neurological Headaches?: No Dizziness?: No Psychologic Depression?: No Anxiety?: Yes  Physical Exam: BP 146/82 mmHg  Pulse 73  Ht  (1.549 m)  Wt 200 lb (90.719 kg)  BMI 37.81 kg/m2  Constitutional:  Alert. No acute distress. Minimally verbal. Does not answer questions appropriately. HEENT: [place] AT, moist mucus membranes.  Trachea midline, no masses. Cardiovascular: No clubbing, cyanosis, or edema. Respiratory: Normal respiratory effort, no increased work of breathing. GI: Abdomen is soft, nontender, nondistended, no abdominal masses GU: No CVA tenderness. Extremely erythematous, excoriated inner thigh and labia consistent with severe candidal infection.  Orthotopic urethral meatus.  No obvious vaginal discharge. Skin: As above. Neurologic: Grossly intact, no focal deficits, moving all 4 extremities. Psychiatric: Blunted.  Laboratory Data: Lab Results  Component Value Date   WBC 9.2 11/21/2014   HGB 11.7* 11/21/2014   HCT 37.0 11/21/2014   MCV 91.3 11/21/2014   PLT 249 11/21/2014    Lab Results  Component Value Date   CREATININE 1.32* 11/21/2014    Lab Results  Component Value Date   HGBA1C 6.1* 06/05/2013    Urinalysis Component     Latest Ref Rng 11/26/2014         3:24 PM  Specific Gravity, UA     1.005 - 1.030 1.015  pH, UA     5.0 - 7.5 6.5  Color, UA     Yellow Yellow  Appearance Ur     Clear Clear  Leukocytes, UA     Negative Negative  Protein, UA     Negative/Trace Negative  Glucose     Negative Negative  Ketones, UA     Negative Negative  RBC, UA     0 -  2 /hpf Negative  Bilirubin, UA     Negative Negative    Urobilinogen, Ur     0.2 - 1.0 mg/dL 0.2  Nitrite, UA     Negative Negative  Microscopic Examination      See below:     Pertinent Imaging:  CLINICAL DATA: Gross hematuria  EXAM: RENAL / URINARY TRACT ULTRASOUND COMPLETE  COMPARISON: None.  FINDINGS: Right Kidney:  Length: 8.8 cm. Normal renal cortical thickness and echogenicity for age without focal lesions or hydronephrosis. No definite renal calculi.  Left Kidney:  Length: 9.0 cm. Normal renal cortical thickness and echogenicity for age without focal lesions or hydronephrosis. No definite renal calculi.  Bladder:  Appears normal for degree of bladder distention.  IMPRESSION: Unremarkable renal ultrasound examination.   Electronically Signed  By: Rudie Meyer M.D.  On: 11/15/2014 16:10     Assessment & Plan:  81 yo M current urinary tract infection, severe candidiasis of external genitalia.  1. Recurrent UTI Patient straight catheter for urine culture today, continue and complete course of Augmentin. Adjust antibiotics based on culture sensitivity data as needed. UA suspicious for infection today. - Urinalysis, Complete - CULTURE, URINE COMPREHENSIVE  2. Candida infection of genital region Will treat with one-time dose of oral fluconazole followed by nystatin cream. Prescriptions for both given to the patient to bring back to nursing home. So a handwritten order was given to resume diaper rash cream once candidal infection has improved.  3. Atrophic vaginitis Recommend starting estrace cream as previously recommended, M-W-F.   Return in about 3 months (around 02/26/2015) for Michiel Cowboy.  Vanna Scotland, MD  [facility] [address] [place], [place] [address] [phone]

## 2014-11-28 LAB — CULTURE, URINE COMPREHENSIVE

## 2014-12-27 ENCOUNTER — Telehealth: Payer: Self-pay | Admitting: Urology

## 2014-12-27 NOTE — Telephone Encounter (Signed)
Patient's caregiver called and stated that they needed to see if she could get an order to have her estrace cream changed from three times a week to once daily? She is having vaginal bleeding they noticed it today. It started mild but has since increased to heavy Can we fax the order over to [facility] at [phone]  Her call back number is [phone]  Thanks, Marcelino Duster

## 2014-12-27 NOTE — Telephone Encounter (Signed)
No they may NOT.  This will not help vaginal bleeding and in fact may make it worse.  Does she have a GYN or PCP?  Sounds like she needs to be seen.  Vanna Scotland, MD

## 2014-12-28 NOTE — Telephone Encounter (Signed)
Unable to get in touch with Daisy [name] at facility. Nurse was able to speak with pt daughter, Daisy [name], and made aware of Dr. Delana Meyer orders. Daisy [name] stated pt does have a PCP and will make an appt. Daisy [name] also stated she will have Daisy [name] call [name] if needed.

## 2015-01-19 ENCOUNTER — Telehealth: Payer: Self-pay

## 2015-01-19 NOTE — Telephone Encounter (Signed)
Felisha from pt facility called requesting a u/a order. Per Carollee Herter pt could either come to [facility] for u/a or the attending physician for the facility should order the u/a. Felisha spoke to pt POA who stated they would prefer facility to collect u/a. Nurse made [facility] aware it would be best if the attending physician ordered u/a. Felisha voiced understanding.

## 2015-01-25 ENCOUNTER — Telehealth: Payer: Self-pay | Admitting: Urology

## 2015-01-25 NOTE — Telephone Encounter (Signed)
Pt went to ER 7/3, went to walk in on labor day, pt has bladder infection.  She just wanted you to be aware.  They won't know results for 2-3 days.  [name], pt daughter called to make [place] aware.

## 2015-02-11 NOTE — Telephone Encounter (Signed)
Did patient see a gynecologist or her PCP for the vaginal bleeding?  Did they ever receive the results of the urine culture from the walk in clinic?  Which walk-in clinic was it?

## 2015-02-11 NOTE — Telephone Encounter (Signed)
LMOM

## 2015-02-15 NOTE — Telephone Encounter (Signed)
LMOM

## 2015-02-15 NOTE — Telephone Encounter (Signed)
The vaginal bleeding may have come from the estrogen cream, but any vaginal bleeding in a post menopausal woman is considered uterine cancer until it is ruled out.  Given the patient's age, I do understand not wanting any further evaluation concerning this symptoms.  As long as the patient and family are aware of the risks.

## 2015-02-15 NOTE — Telephone Encounter (Signed)
Spoke with pt daughter, [name], who stated pt never saw anyone in reference to vaginal bleeding. [name] stated the bleeding was coming from medication and when pt stopped the medication the bleeding stopped.

## 2015-02-16 NOTE — Telephone Encounter (Signed)
Spoke with [name], pt daughter, in reference to vaginal bleeding post menopause. [name] stated the bleeding stopped after the medication stopped and she was not concerned. Reinforced with [name] the risk of uterine cancer. [name] voiced understanding and stated she would see a physician if the bleeding came back.

## 2015-02-22 ENCOUNTER — Ambulatory Visit (INDEPENDENT_AMBULATORY_CARE_PROVIDER_SITE_OTHER): Payer: PPO | Admitting: Urology

## 2015-02-22 ENCOUNTER — Encounter: Payer: Self-pay | Admitting: Urology

## 2015-02-22 VITALS — BP 158/86 | HR 73 | Ht 61.0 in | Wt 195.6 lb

## 2015-02-22 DIAGNOSIS — N952 Postmenopausal atrophic vaginitis: Secondary | ICD-10-CM

## 2015-02-22 DIAGNOSIS — N390 Urinary tract infection, site not specified: Secondary | ICD-10-CM

## 2015-02-22 MED ORDER — ESTRADIOL 0.1 MG/GM VA CREA: TOPICAL_CREAM | VAGINAL | Status: DC

## 2015-02-22 NOTE — Patient Instructions (Signed)
Apply 0.5mg (pea-sized amount)  just inside the vaginal introitus with a finger-tip every night for two weeks and then Monday, Wednesday and Friday nights.  

## 2015-02-22 NOTE — Progress Notes (Signed)
02/22/2015 10:10 AM   Daisy [name] 1921-11-14 [id number]  Referring provider: Kandyce Rud, MD [phone] [address] [facility] [place], [place] [address]  Chief Complaint  Patient presents with  . Recurrent UTI    3 month followup  . Vaginitis    HPI: Patient is a 81 year old white female with a history of recurrent urinary tract infections and atrophic vaginitis who presents today for 3 month follow-up.  She has had 6 documented UTI's positive for Escherichia coli over the last year.  Her infections are heralded by mental confusion and malodorous urine.   Today, she presents with her daughter and is not having any complaints. She is mostly nonambulatory and has a high BMI which is also contributing to her recurrent urinary tract infections.   She has not been receiving the vaginal estrogen cream at her skilled nursing facility, [facility].  She also suffers from incontinence wearing depends daily and not receiving clear signals when she needs to void or assistance and a timely manner.  Her urinalysis today is suspicious for infection, but she is not having any symptomatology at this time.  PMH: Past Medical History  Diagnosis Date  . Other and unspecified hyperlipidemia 06/05/2013  . Essential hypertension, benign 06/05/2013  . Depression 06/05/2013  . GERD (gastroesophageal reflux disease) 06/05/2013  . Recurrent UTI   . Paronychia, toe   . Anemia   . Acute renal failure (HCC)   . Cerebral artery occlusion (HCC)     with cerebral infarction  . Dementia   . Asthma   . Edema   . Osteoarthritis   . Stroke (HCC)   . Heart attack [facility])     Surgical History: Past Surgical History  Procedure Laterality Date  . Knee surgery Right   . Cardiac surgery      Left BBB    Home Medications:    Medication List       This list is accurate as of: 02/22/15 11:59 PM.  Always use your most recent med list.               acetaminophen 500  MG tablet  Commonly known as:  TYLENOL  Take 1,000 mg by mouth every 6 (six) hours as needed for moderate pain.     ALPRAZolam 0.5 MG tablet  Commonly known as:  XANAX  Take 0.5 mg by mouth at bedtime as needed for anxiety.     amLODipine 2.5 MG tablet  Commonly known as:  NORVASC  Take 1 tablet (2.5 mg total) by mouth daily.     aspirin 325 MG tablet  Take 1 tablet (325 mg total) by mouth daily.     carvedilol 12.5 MG tablet  Commonly known as:  COREG  Take 1 tablet (12.5 mg total) by mouth 2 (two) times daily with a meal.     cetirizine 10 MG tablet  Commonly known as:  ZYRTEC  Take 10 mg by mouth every evening.     citalopram 20 MG tablet  Commonly known as:  CELEXA  Take 1 tablet (20 mg total) by mouth daily.     estradiol 0.1 MG/GM vaginal cream  Commonly known as:  ESTRACE VAGINAL  Apply 0.5mg  (pea-sized amount)  just inside the vaginal introitus with a finger-tip every night for two weeks and then Monday, Wednesday and Friday nights.     ferrous sulfate 325 (65 FE) MG tablet  Take 325 mg by mouth daily.  fluconazole 150 MG tablet  Commonly known as:  DIFLUCAN  Take 1 tablet (150 mg total) by mouth once.     Fluticasone-Salmeterol 250-50 MCG/DOSE Aepb  Commonly known as:  ADVAIR  Inhale 1 puff into the lungs 2 (two) times daily.     furosemide 40 MG tablet  Commonly known as:  LASIX  Take 0.5 tablets (20 mg total) by mouth daily.     HYDROcodone-homatropine 5-1.5 MG/5ML syrup  Commonly known as:  HYCODAN  TAKE 5ML'S BY MOUTH AT BEDTIME     hydrocortisone cream 1 %  Apply 1 application topically 2 (two) times daily as needed (rash).     nystatin cream  Commonly known as:  MYCOSTATIN  Apply 1 application topically 2 (two) times daily.     omeprazole 20 MG capsule  Commonly known as:  PRILOSEC  Take 20 mg by mouth daily.     PROAIR HFA 108 (90 BASE) MCG/ACT inhaler  Generic drug:  albuterol  Inhale 1 puff into the lungs every 6 (six) hours as  needed. For wheezing/shortness of breath     RA VITAMIN B-12 TR 1000 MCG Tbcr  Generic drug:  Cyanocobalamin  Take by mouth.     rosuvastatin 10 MG tablet  Commonly known as:  CRESTOR  Take 1 tablet (10 mg total) by mouth at bedtime.     simvastatin 20 MG tablet  Commonly known as:  ZOCOR  Take 20 mg by mouth daily.     tiotropium 18 MCG inhalation capsule  Commonly known as:  SPIRIVA  Place 18 mcg into inhaler and inhale daily.        Allergies:  Allergies  Allergen Reactions  . Aricept [Donepezil Hcl] Other (See Comments)    "has nightmares"  . Codeine     REACTION: Pt. unsure.  Marland Kitchen Ketek [Telithromycin] Other (See Comments)    "fearful state, not herself"    Family History: Family History  Problem Relation Age of Onset  . Prostate cancer Son   . Kidney disease Neg Hx   . Bladder Cancer Neg Hx   . Cancer Mother     Oral  . Colon cancer Sister     Social History:  reports that she has quit smoking. She has never used smokeless tobacco. She reports that she drinks alcohol. She reports that she does not use illicit drugs.  ROS: UROLOGY Frequent Urination?: Yes Hard to postpone urination?: Yes Burning/pain with urination?: No Get up at night to urinate?: No Leakage of urine?: Yes Urine stream starts and stops?: No Trouble starting stream?: Yes Do you have to strain to urinate?: No Blood in urine?: No Urinary tract infection?: No Sexually transmitted disease?: No Injury to kidneys or bladder?: No Painful intercourse?: No Weak stream?: No Currently pregnant?: No Vaginal bleeding?: No Last menstrual period?: n  Gastrointestinal Nausea?: No Vomiting?: No Indigestion/heartburn?: No Diarrhea?: No Constipation?: No  Constitutional Fever: No Night sweats?: No Weight loss?: No Fatigue?: No  Skin Skin rash/lesions?: No Itching?: Yes  Eyes Blurred vision?: No Double vision?: No  Ears/Nose/Throat Sore throat?: No Sinus problems?:  No  Hematologic/Lymphatic Swollen glands?: No Easy bruising?: Yes  Cardiovascular Leg swelling?: Yes Chest pain?: No  Respiratory Cough?: No Shortness of breath?: Yes  Endocrine Excessive thirst?: No  Musculoskeletal Back pain?: No Joint pain?: No  Neurological Headaches?: No Dizziness?: No  Psychologic Depression?: No Anxiety?: No  Physical Exam: BP 158/86 mmHg  Pulse 73  Ht  (1.549 m)  Wt 195  lb 9.6 oz (88.724 kg)  BMI 36.98 kg/m2  GU:  Atrophic external genitalia.  Normal urethral meatus. No urethral masses and/or tenderness. No bladder fullness or masses. No vaginal lesions or discharge. Rectocele, no masses. Normal anus and perineum.   Laboratory Data: Lab Results  Component Value Date   WBC 9.2 11/21/2014   HGB 11.7* 11/21/2014   HCT 37.0 11/21/2014   MCV 91.3 11/21/2014   PLT 249 11/21/2014    Lab Results  Component Value Date   CREATININE 1.32* 11/21/2014    Lab Results  Component Value Date   HGBA1C 6.1* 06/05/2013    Urinalysis Results for orders placed or performed in visit on 02/22/15  Microscopic Examination  Result Value Ref Range   WBC, UA 6-10 (A) 0 -  5 /hpf   RBC, UA None seen 0 -  2 /hpf   Epithelial Cells (non renal) 0-10 0 - 10 /hpf   Mucus, UA Present (A) Not Estab.   Bacteria, UA Many (A) None seen/Few  Urinalysis, Complete  Result Value Ref Range   Specific Gravity, UA 1.010 1.005 - 1.030   pH, UA 5.0 5.0 - 7.5   Color, UA Yellow Yellow   Appearance Ur Clear Clear   Leukocytes, UA 1+ (A) Negative   Protein, UA Negative Negative/Trace   Glucose, UA Negative Negative   Ketones, UA Negative Negative   RBC, UA Trace (A) Negative   Bilirubin, UA Negative Negative   Urobilinogen, Ur 0.2 0.2 - 1.0 mg/dL   Nitrite, UA Positive (A) Negative   Microscopic Examination See below:     Procedure: In and Out Catheterization  Patient is present today for a I & O catheterization due to recurrent UTI's. Patient was  cleaned and prepped in a sterile fashion with betadine and Lidocaine 2% jelly was instilled into the urethra.  A 14 FR cath was inserted no complications were noted ,  200 ml of urine return was noted, urine was yellow, clear in color. A clean urine sample was collected for culture. Bladder was drained  And catheter was removed with out difficulty.    Preformed by: Michiel Cowboy, P.A.-C    Assessment & Plan:    1. Recurrent UTI:   Although the urine is suspicious for infection, I will not prescribe an antibiotic at this time due to patient's not exhibiting any of her usual symptoms when she has infections, such as: Mental status changes and malodorous urine. I will send it for culture so if in the future if she should develop symptoms of urinary tract infection we will prescribe the appropriate antibiotic. I explained to the daughter that we typically don't obtain urinalyses or urine cultures and asymptomatic individuals.  This is due to the high resistant rate of bacteria that has become more.  I did offer referral to infectious disease, but the daughter does not want to put her mother through any more evaluation at this time.  She will RTC in 3 months for a CATH UA.    - Urinalysis, Complete -CULTURE, URINE COMPREHENSIVE  2. Atrophic vaginitis:   I will restart the vaginal estrogen cream. I will obstruct the skilled nursing facility to apply the vaginal cream 3 nights weekly. I explained that this is a maintenance medication and must be continued whether she is symptomatic or not.  She will RTC in 3 months for vaginal exam.    Return in about 3 months (around 05/25/2015) for exam and cath UA.  Chabeli Barsamian, PA-C  [facility] [address], [place], [place] [address] [phone]

## 2015-02-23 LAB — MICROSCOPIC EXAMINATION: RBC, UA: NONE SEEN /hpf (ref 0–?)

## 2015-02-23 LAB — URINALYSIS, COMPLETE
BILIRUBIN UA: NEGATIVE
Glucose, UA: NEGATIVE
Ketones, UA: NEGATIVE
NITRITE UA: POSITIVE — AB
PH UA: 5 (ref 5.0–7.5)
PROTEIN UA: NEGATIVE
Specific Gravity, UA: 1.01 (ref 1.005–1.030)
UUROB: 0.2 mg/dL (ref 0.2–1.0)

## 2015-02-24 ENCOUNTER — Ambulatory Visit: Payer: PPO | Admitting: Urology

## 2015-02-25 LAB — CULTURE, URINE COMPREHENSIVE

## 2015-03-01 ENCOUNTER — Telehealth: Payer: Self-pay

## 2015-03-01 DIAGNOSIS — N390 Urinary tract infection, site not specified: Secondary | ICD-10-CM

## 2015-03-01 NOTE — Telephone Encounter (Signed)
-----   Message from Fernanda Drum, FNP sent at 03/01/2015  8:35 AM EDT ----- Please notify patient's daughter at that her urine culture was positive for significant bacterial growth. Per Shannon's last note the patient was asymptomatic. This could be chronic bacterial colonization of the urinary tract versus vaginal contaminant. The bacteria is becoming increasingly resistant to oral antibiotics. If the patient is asymptomatic I would not like to prescribe an antibiotic at this time. I do agree with Shannon's previous recommendation that patient be evaluated by ID. Should the patient developed altered mental status changes and/or fevers we will need to treat with antibiotics. She would need to be seen by ID for evaluation and possibly starting IV antibiotics. Thanks

## 2015-03-01 NOTE — Telephone Encounter (Signed)
Spoke with pt daughter, [name], in reference to urine cx. [name] voiced understanding. Referral to ID put in epic. Reinforced with daughter if pt develops altered mental status, fevers, chills to call us back. Daughter voiced understanding.

## 2015-03-03 ENCOUNTER — Telehealth: Payer: Self-pay

## 2015-03-03 NOTE — Telephone Encounter (Signed)
LMOM

## 2015-03-03 NOTE — Telephone Encounter (Signed)
Spoke with pt daughter, Lura Ematsy, in reference to referral for ID. Made [name] aware referral has been completed and she should be hearing from ID in the next couple of days. [name] voiced understanding.

## 2015-03-04 ENCOUNTER — Telehealth: Payer: Self-pay | Admitting: *Deleted

## 2015-03-04 NOTE — Telephone Encounter (Signed)
Brandy from [facility]. [facility] office called and states he will not see her because she is not resistant to three organisms/ antibitotics. She said he recommends either Macrobid bid for 10 days or Monurol 3 grams every other day for three doses.

## 2015-03-14 ENCOUNTER — Telehealth: Payer: Self-pay

## 2015-03-14 NOTE — Telephone Encounter (Signed)
[name], pt daughter, called c/o pt having a foul odor to her urine and sleeping more. Please advise.

## 2015-03-14 NOTE — Telephone Encounter (Signed)
We will need a urine for UA and culture.

## 2015-03-14 NOTE — Telephone Encounter (Signed)
Spoke with pt daughter, Lura Ematsy, and made aware for the need of a urine sample. [name] voiced her concern of not being able to get a sample. Nurse reinforced the need for one so we can send it for a ucx. [name] voiced understanding.

## 2015-05-19 ENCOUNTER — Encounter: Payer: Self-pay | Admitting: Family Medicine

## 2015-05-25 ENCOUNTER — Ambulatory Visit: Payer: PPO | Admitting: Urology

## 2015-06-27 DIAGNOSIS — B356 Tinea cruris: Secondary | ICD-10-CM | POA: Diagnosis not present

## 2015-07-12 DIAGNOSIS — B356 Tinea cruris: Secondary | ICD-10-CM | POA: Diagnosis not present

## 2015-08-02 DIAGNOSIS — F0390 Unspecified dementia without behavioral disturbance: Secondary | ICD-10-CM | POA: Diagnosis not present

## 2015-08-02 DIAGNOSIS — Z8679 Personal history of other diseases of the circulatory system: Secondary | ICD-10-CM | POA: Diagnosis not present

## 2015-08-02 DIAGNOSIS — I639 Cerebral infarction, unspecified: Secondary | ICD-10-CM | POA: Diagnosis not present

## 2015-08-02 DIAGNOSIS — R600 Localized edema: Secondary | ICD-10-CM | POA: Diagnosis not present

## 2015-08-02 DIAGNOSIS — N183 Chronic kidney disease, stage 3 (moderate): Secondary | ICD-10-CM | POA: Diagnosis not present

## 2015-08-02 DIAGNOSIS — K219 Gastro-esophageal reflux disease without esophagitis: Secondary | ICD-10-CM | POA: Diagnosis not present

## 2015-08-02 DIAGNOSIS — I5032 Chronic diastolic (congestive) heart failure: Secondary | ICD-10-CM | POA: Diagnosis not present

## 2015-08-02 DIAGNOSIS — I10 Essential (primary) hypertension: Secondary | ICD-10-CM | POA: Diagnosis not present

## 2015-08-02 DIAGNOSIS — E784 Other hyperlipidemia: Secondary | ICD-10-CM | POA: Diagnosis not present

## 2015-08-02 DIAGNOSIS — D649 Anemia, unspecified: Secondary | ICD-10-CM | POA: Diagnosis not present

## 2015-08-15 DIAGNOSIS — H10523 Angular blepharoconjunctivitis, bilateral: Secondary | ICD-10-CM | POA: Diagnosis not present

## 2015-08-15 DIAGNOSIS — H01009 Unspecified blepharitis unspecified eye, unspecified eyelid: Secondary | ICD-10-CM | POA: Diagnosis not present

## 2015-09-30 ENCOUNTER — Emergency Department: Payer: PPO

## 2015-09-30 ENCOUNTER — Inpatient Hospital Stay: Payer: PPO

## 2015-09-30 ENCOUNTER — Inpatient Hospital Stay
Admission: EM | Admit: 2015-09-30 | Discharge: 2015-10-04 | DRG: 481 | Disposition: A | Discharge status: Skilled Nursing Facility | Payer: PPO | Attending: Internal Medicine | Admitting: Internal Medicine

## 2015-09-30 DIAGNOSIS — Z7982 Long term (current) use of aspirin: Secondary | ICD-10-CM | POA: Diagnosis not present

## 2015-09-30 DIAGNOSIS — M545 Low back pain: Secondary | ICD-10-CM | POA: Diagnosis not present

## 2015-09-30 DIAGNOSIS — N390 Urinary tract infection, site not specified: Secondary | ICD-10-CM

## 2015-09-30 DIAGNOSIS — D62 Acute posthemorrhagic anemia: Secondary | ICD-10-CM | POA: Diagnosis not present

## 2015-09-30 DIAGNOSIS — I129 Hypertensive chronic kidney disease with stage 1 through stage 4 chronic kidney disease, or unspecified chronic kidney disease: Secondary | ICD-10-CM | POA: Diagnosis present

## 2015-09-30 DIAGNOSIS — A498 Other bacterial infections of unspecified site: Secondary | ICD-10-CM

## 2015-09-30 DIAGNOSIS — R739 Hyperglycemia, unspecified: Secondary | ICD-10-CM | POA: Diagnosis present

## 2015-09-30 DIAGNOSIS — Z8673 Personal history of transient ischemic attack (TIA), and cerebral infarction without residual deficits: Secondary | ICD-10-CM | POA: Diagnosis not present

## 2015-09-30 DIAGNOSIS — Z87891 Personal history of nicotine dependence: Secondary | ICD-10-CM

## 2015-09-30 DIAGNOSIS — K219 Gastro-esophageal reflux disease without esophagitis: Secondary | ICD-10-CM | POA: Diagnosis present

## 2015-09-30 DIAGNOSIS — D696 Thrombocytopenia, unspecified: Secondary | ICD-10-CM | POA: Diagnosis present

## 2015-09-30 DIAGNOSIS — I161 Hypertensive emergency: Secondary | ICD-10-CM | POA: Diagnosis not present

## 2015-09-30 DIAGNOSIS — Z87440 Personal history of urinary (tract) infections: Secondary | ICD-10-CM | POA: Diagnosis not present

## 2015-09-30 DIAGNOSIS — R509 Fever, unspecified: Secondary | ICD-10-CM

## 2015-09-30 DIAGNOSIS — Z800 Family history of malignant neoplasm of digestive organs: Secondary | ICD-10-CM | POA: Diagnosis not present

## 2015-09-30 DIAGNOSIS — S72141A Displaced intertrochanteric fracture of right femur, initial encounter for closed fracture: Principal | ICD-10-CM | POA: Diagnosis present

## 2015-09-30 DIAGNOSIS — F0390 Unspecified dementia without behavioral disturbance: Secondary | ICD-10-CM | POA: Diagnosis not present

## 2015-09-30 DIAGNOSIS — S199XXA Unspecified injury of neck, initial encounter: Secondary | ICD-10-CM | POA: Diagnosis not present

## 2015-09-30 DIAGNOSIS — Z8042 Family history of malignant neoplasm of prostate: Secondary | ICD-10-CM

## 2015-09-30 DIAGNOSIS — W010XXA Fall on same level from slipping, tripping and stumbling without subsequent striking against object, initial encounter: Secondary | ICD-10-CM | POA: Diagnosis not present

## 2015-09-30 DIAGNOSIS — E785 Hyperlipidemia, unspecified: Secondary | ICD-10-CM | POA: Diagnosis present

## 2015-09-30 DIAGNOSIS — B9620 Unspecified Escherichia coli [E. coli] as the cause of diseases classified elsewhere: Secondary | ICD-10-CM | POA: Diagnosis present

## 2015-09-30 DIAGNOSIS — J45909 Unspecified asthma, uncomplicated: Secondary | ICD-10-CM | POA: Diagnosis present

## 2015-09-30 DIAGNOSIS — Z79899 Other long term (current) drug therapy: Secondary | ICD-10-CM | POA: Diagnosis not present

## 2015-09-30 DIAGNOSIS — Z01818 Encounter for other preprocedural examination: Secondary | ICD-10-CM | POA: Diagnosis not present

## 2015-09-30 DIAGNOSIS — N183 Chronic kidney disease, stage 3 unspecified: Secondary | ICD-10-CM

## 2015-09-30 DIAGNOSIS — S72001A Fracture of unspecified part of neck of right femur, initial encounter for closed fracture: Secondary | ICD-10-CM | POA: Diagnosis not present

## 2015-09-30 DIAGNOSIS — F329 Major depressive disorder, single episode, unspecified: Secondary | ICD-10-CM | POA: Diagnosis present

## 2015-09-30 DIAGNOSIS — Z22322 Carrier or suspected carrier of Methicillin resistant Staphylococcus aureus: Secondary | ICD-10-CM

## 2015-09-30 DIAGNOSIS — W19XXXA Unspecified fall, initial encounter: Secondary | ICD-10-CM

## 2015-09-30 DIAGNOSIS — S0990XA Unspecified injury of head, initial encounter: Secondary | ICD-10-CM | POA: Diagnosis not present

## 2015-09-30 DIAGNOSIS — D72829 Elevated white blood cell count, unspecified: Secondary | ICD-10-CM

## 2015-09-30 DIAGNOSIS — S72009A Fracture of unspecified part of neck of unspecified femur, initial encounter for closed fracture: Secondary | ICD-10-CM

## 2015-09-30 DIAGNOSIS — I252 Old myocardial infarction: Secondary | ICD-10-CM

## 2015-09-30 LAB — TROPONIN I

## 2015-09-30 LAB — CBC WITH DIFFERENTIAL/PLATELET
BASOS ABS: 0.1 10*3/uL (ref 0–0.1)
Basophils Relative: 1 %
Eosinophils Absolute: 0.3 10*3/uL (ref 0–0.7)
Eosinophils Relative: 4 %
HEMATOCRIT: 40.3 % (ref 35.0–47.0)
Hemoglobin: 13.3 g/dL (ref 12.0–16.0)
Lymphs Abs: 1.5 10*3/uL (ref 1.0–3.6)
MCH: 29.8 pg (ref 26.0–34.0)
MCHC: 32.9 g/dL (ref 32.0–36.0)
MCV: 90.7 fL (ref 80.0–100.0)
Monocytes Absolute: 0.9 10*3/uL (ref 0.2–0.9)
Monocytes Relative: 10 %
NEUTROS ABS: 6.3 10*3/uL (ref 1.4–6.5)
Neutrophils Relative %: 68 %
Platelets: 192 10*3/uL (ref 150–440)
RBC: 4.45 MIL/uL (ref 3.80–5.20)
RDW: 14.5 % (ref 11.5–14.5)
WBC: 9.1 10*3/uL (ref 3.6–11.0)

## 2015-09-30 LAB — COMPREHENSIVE METABOLIC PANEL
ALBUMIN: 3.9 g/dL (ref 3.5–5.0)
ALT: 9 U/L — AB (ref 14–54)
AST: 15 U/L (ref 15–41)
Alkaline Phosphatase: 69 U/L (ref 38–126)
Anion gap: 6 (ref 5–15)
BILIRUBIN TOTAL: 0.4 mg/dL (ref 0.3–1.2)
BUN: 16 mg/dL (ref 6–20)
CO2: 34 mmol/L — ABNORMAL HIGH (ref 22–32)
CREATININE: 1.18 mg/dL — AB (ref 0.44–1.00)
Calcium: 8.7 mg/dL — ABNORMAL LOW (ref 8.9–10.3)
Chloride: 100 mmol/L — ABNORMAL LOW (ref 101–111)
GFR calc Af Amer: 45 mL/min — ABNORMAL LOW (ref >60)
GFR, EST NON AFRICAN AMERICAN: 39 mL/min — AB (ref >60)
GLUCOSE: 115 mg/dL — AB (ref 65–99)
Potassium: 3.7 mmol/L (ref 3.5–5.1)
Sodium: 140 mmol/L (ref 135–145)
Total Protein: 7.3 g/dL (ref 6.5–8.1)

## 2015-09-30 LAB — ABO/RH: ABO/RH(D): A POS

## 2015-09-30 LAB — URINALYSIS COMPLETE WITH MICROSCOPIC (ARMC ONLY)
BILIRUBIN URINE: NEGATIVE
Glucose, UA: NEGATIVE mg/dL
Ketones, ur: NEGATIVE mg/dL
Nitrite: POSITIVE — AB
Protein, ur: NEGATIVE mg/dL
Specific Gravity, Urine: 1.008 (ref 1.005–1.030)
pH: 7 (ref 5.0–8.0)

## 2015-09-30 LAB — APTT: APTT: 29 s (ref 24–36)

## 2015-09-30 LAB — PROTIME-INR
INR: 1.03
Prothrombin Time: 13.7 seconds (ref 11.4–15.0)

## 2015-09-30 LAB — TYPE AND SCREEN
ABO/RH(D): A POS
Antibody Screen: NEGATIVE

## 2015-09-30 LAB — SURGICAL PCR SCREEN
MRSA, PCR: POSITIVE — AB
Staphylococcus aureus: POSITIVE — AB

## 2015-09-30 MED ORDER — NYSTATIN 100000 UNIT/GM EX POWD
Freq: Two times a day (BID) | CUTANEOUS | Status: DC
  Administered 2015-10-01 – 2015-10-04 (×6): via TOPICAL
  Filled 2015-09-30: qty 15

## 2015-09-30 MED ORDER — AMLODIPINE BESYLATE 10 MG PO TABS
10.0000 mg | ORAL_TABLET | Freq: Every day | ORAL | Status: DC
  Administered 2015-09-30 – 2015-10-02 (×3): 10 mg via ORAL
  Filled 2015-09-30 (×3): qty 1

## 2015-09-30 MED ORDER — ONDANSETRON HCL 4 MG/2ML IJ SOLN: 4.0000 mg | Freq: Four times a day (QID) | INTRAMUSCULAR | Status: DC | PRN

## 2015-09-30 MED ORDER — ONDANSETRON HCL 4 MG/2ML IJ SOLN
4.0000 mg | Freq: Once | INTRAMUSCULAR | Status: AC
  Administered 2015-09-30: 4 mg via INTRAVENOUS
  Filled 2015-09-30: qty 2

## 2015-09-30 MED ORDER — ACETAMINOPHEN 650 MG RE SUPP: 650.0000 mg | Freq: Four times a day (QID) | RECTAL | Status: DC | PRN

## 2015-09-30 MED ORDER — OXYCODONE HCL 5 MG PO TABS
5.0000 mg | ORAL_TABLET | ORAL | Status: DC | PRN
  Administered 2015-09-30: 5 mg via ORAL
  Filled 2015-09-30: qty 1

## 2015-09-30 MED ORDER — CEFAZOLIN SODIUM-DEXTROSE 2-4 GM/100ML-% IV SOLN
2.0000 g | INTRAVENOUS | Status: DC
  Filled 2015-09-30: qty 100

## 2015-09-30 MED ORDER — SENNA 8.6 MG PO TABS: 1.0000 | ORAL_TABLET | Freq: Every day | ORAL | Status: DC

## 2015-09-30 MED ORDER — ALBUTEROL SULFATE (2.5 MG/3ML) 0.083% IN NEBU: 2.5000 mg | INHALATION_SOLUTION | RESPIRATORY_TRACT | Status: DC | PRN

## 2015-09-30 MED ORDER — DEXTROSE 5 % IV SOLN
1.0000 g | INTRAVENOUS | Status: DC
  Filled 2015-09-30: qty 10

## 2015-09-30 MED ORDER — MORPHINE SULFATE (PF) 4 MG/ML IV SOLN
4.0000 mg | INTRAVENOUS | Status: DC | PRN
Start: 2015-09-30 — End: 2015-10-01

## 2015-09-30 MED ORDER — MORPHINE SULFATE (PF) 4 MG/ML IV SOLN
4.0000 mg | Freq: Once | INTRAVENOUS | Status: AC
  Administered 2015-09-30: 4 mg via INTRAVENOUS
  Filled 2015-09-30: qty 1

## 2015-09-30 MED ORDER — CHLORHEXIDINE GLUCONATE CLOTH 2 % EX PADS
6.0000 | MEDICATED_PAD | Freq: Every day | CUTANEOUS | Status: DC
  Administered 2015-10-01: 6 via TOPICAL

## 2015-09-30 MED ORDER — CEFTRIAXONE SODIUM 1 G IJ SOLR
1.0000 g | Freq: Once | INTRAMUSCULAR | Status: AC
  Administered 2015-09-30: 1 g via INTRAVENOUS
  Filled 2015-09-30: qty 10

## 2015-09-30 MED ORDER — MORPHINE SULFATE (PF) 2 MG/ML IV SOLN
2.0000 mg | INTRAVENOUS | Status: DC | PRN
  Administered 2015-10-01: 2 mg via INTRAVENOUS
  Filled 2015-09-30: qty 1

## 2015-09-30 MED ORDER — DEXTROSE 5 % IV SOLN
1.0000 g | INTRAVENOUS | Status: DC
  Administered 2015-10-01 – 2015-10-02 (×2): 1 g via INTRAVENOUS
  Filled 2015-09-30 (×3): qty 10

## 2015-09-30 MED ORDER — CITALOPRAM HYDROBROMIDE 20 MG PO TABS
40.0000 mg | ORAL_TABLET | Freq: Every day | ORAL | Status: DC
  Administered 2015-10-02 – 2015-10-04 (×3): 40 mg via ORAL
  Filled 2015-09-30 (×3): qty 2

## 2015-09-30 MED ORDER — ONDANSETRON HCL 4 MG PO TABS: 4.0000 mg | ORAL_TABLET | Freq: Four times a day (QID) | ORAL | Status: DC | PRN

## 2015-09-30 MED ORDER — MUPIROCIN 2 % EX OINT
1.0000 "application " | TOPICAL_OINTMENT | Freq: Two times a day (BID) | CUTANEOUS | Status: DC
  Administered 2015-10-01: 1 via NASAL
  Filled 2015-09-30: qty 22

## 2015-09-30 MED ORDER — HYDROCODONE-ACETAMINOPHEN 5-325 MG PO TABS: 1.0000 | ORAL_TABLET | ORAL | Status: DC | PRN

## 2015-09-30 MED ORDER — PANTOPRAZOLE SODIUM 40 MG PO TBEC
40.0000 mg | DELAYED_RELEASE_TABLET | Freq: Every day | ORAL | Status: DC
  Administered 2015-10-01 – 2015-10-04 (×4): 40 mg via ORAL
  Filled 2015-09-30 (×4): qty 1

## 2015-09-30 MED ORDER — TIOTROPIUM BROMIDE MONOHYDRATE 18 MCG IN CAPS
18.0000 ug | ORAL_CAPSULE | Freq: Every day | RESPIRATORY_TRACT | Status: DC
  Administered 2015-10-02 – 2015-10-04 (×3): 18 ug via RESPIRATORY_TRACT
  Filled 2015-09-30: qty 5

## 2015-09-30 MED ORDER — ACETAMINOPHEN 325 MG PO TABS: 650.0000 mg | ORAL_TABLET | Freq: Four times a day (QID) | ORAL | Status: DC | PRN

## 2015-09-30 MED ORDER — TOBRAMYCIN 0.3 % OP SOLN
1.0000 [drp] | Freq: Four times a day (QID) | OPHTHALMIC | Status: DC
  Administered 2015-09-30 – 2015-10-04 (×12): 1 [drp] via OPHTHALMIC
  Filled 2015-09-30: qty 5

## 2015-09-30 MED ORDER — CARVEDILOL 6.25 MG PO TABS
12.5000 mg | ORAL_TABLET | Freq: Two times a day (BID) | ORAL | Status: DC
  Administered 2015-10-01 – 2015-10-02 (×4): 12.5 mg via ORAL
  Filled 2015-09-30 (×4): qty 2

## 2015-09-30 MED ORDER — CITALOPRAM HYDROBROMIDE 20 MG PO TABS: 40.0000 mg | ORAL_TABLET | Freq: Every day | ORAL | Status: DC

## 2015-09-30 MED ORDER — SIMVASTATIN 20 MG PO TABS
20.0000 mg | ORAL_TABLET | Freq: Every evening | ORAL | Status: DC
  Administered 2015-10-01 – 2015-10-03 (×3): 20 mg via ORAL
  Filled 2015-09-30 (×3): qty 1

## 2015-09-30 MED ORDER — HYDRALAZINE HCL 20 MG/ML IJ SOLN
10.0000 mg | Freq: Four times a day (QID) | INTRAMUSCULAR | Status: DC | PRN
  Administered 2015-09-30 – 2015-10-01 (×2): 10 mg via INTRAVENOUS
  Filled 2015-09-30 (×2): qty 1

## 2015-09-30 MED ORDER — MOMETASONE FURO-FORMOTEROL FUM 200-5 MCG/ACT IN AERO
2.0000 | INHALATION_SPRAY | Freq: Two times a day (BID) | RESPIRATORY_TRACT | Status: DC
Start: 2015-09-30 — End: 2015-10-04
  Administered 2015-09-30 – 2015-10-04 (×7): 2 via RESPIRATORY_TRACT
  Filled 2015-09-30: qty 8.8

## 2015-09-30 MED ORDER — VITAMIN B-12 1000 MCG PO TABS
1000.0000 ug | ORAL_TABLET | Freq: Every day | ORAL | Status: DC
  Administered 2015-10-02 – 2015-10-04 (×3): 1000 ug via ORAL
  Filled 2015-09-30 (×3): qty 1

## 2015-09-30 NOTE — ED Notes (Signed)
Pt desatted tot 85% after returning for DG, placed on 2L

## 2015-09-30 NOTE — H&P (Addendum)
[facility] [place] [facility]   PATIENT NAME: Daisy [name]    MR#:  [id number]  DATE OF BIRTH:  [birth year]  DATE OF ADMISSION:  09/30/2015  PRIMARY CARE PHYSICIAN: BABAOFF, Lavada Mesi, MD   REQUESTING/REFERRING PHYSICIAN: Emily Filbert, MD  CHIEF COMPLAINT:   Chief Complaint  Patient presents with  . Fall   Fall and right hip pain today. HISTORY OF PRESENT ILLNESS:  Daisy [name]  is a 83 y.o. female with a known history of Hypertension, recurrent UTI, stroke and heart attack. The patient was sent from Assisted living facility to the hospital due to unwitnessed fall today. She was found on the floor with a walker on top of her. She cannot give detailed history due to dementia. She has severe right hip pain with movement. X-ray show right hip fracture. Urinalysis show UTI. The patient is treated with Rocephin in the ED. Dr. Martha Clan, orthopedic surgeon, plans to do hip surgery.  PAST MEDICAL HISTORY:   Past Medical History  Diagnosis Date  . Other and unspecified hyperlipidemia 06/05/2013  . Essential hypertension, benign 06/05/2013  . Depression 06/05/2013  . GERD (gastroesophageal reflux disease) 06/05/2013  . Recurrent UTI   . Paronychia, toe   . Anemia   . Acute renal failure (HCC)   . Cerebral artery occlusion (HCC)     with cerebral infarction  . Dementia   . Asthma   . Edema   . Osteoarthritis   . Stroke (HCC)   . Heart attack (HCC)     PAST SURGICAL HISTORY:   Past Surgical History  Procedure Laterality Date  . Knee surgery Right   . Cardiac surgery      Left BBB    SOCIAL HISTORY:   Social History  Substance Use Topics  . Smoking status: Former Games developer  . Smokeless tobacco: Never Used     Comment: quit 50 + years  . Alcohol Use: 0.0 oz/week    0 Standard drinks or equivalent per week     Comment: occasionally    FAMILY HISTORY:   Family History  Problem Relation Age of Onset  . Prostate cancer Son   . Kidney  disease Neg Hx   . Bladder Cancer Neg Hx   . Cancer Mother     Oral  . Colon cancer Sister     DRUG ALLERGIES:   Allergies  Allergen Reactions  . Aricept [Donepezil Hcl] Other (See Comments)    "has nightmares"  . Codeine     REACTION: Pt. unsure.  Marland Kitchen Ketek [Telithromycin] Other (See Comments)    "fearful state, not herself"    REVIEW OF SYSTEMS:  CONSTITUTIONAL: No fever, fatigue or weakness.  EYES: No blurred or double vision.  EARS, NOSE, AND THROAT: No tinnitus or ear pain.  RESPIRATORY: No cough, shortness of breath, wheezing or hemoptysis.  CARDIOVASCULAR: No chest pain, orthopnea, edema.  GASTROINTESTINAL: No nausea, vomiting, diarrhea or abdominal pain.  GENITOURINARY: No dysuria, hematuria. But has incontinence. ENDOCRINE: No polyuria, nocturia,  HEMATOLOGY: No anemia, easy bruising or bleeding SKIN: No rash or lesion. MUSCULOSKELETAL: Right hip pain.  NEUROLOGIC: No tingling, numbness, weakness.  PSYCHIATRY: No anxiety or depression.   MEDICATIONS AT HOME:   Prior to Admission medications   Medication Sig Start Date End Date Taking? Authorizing Provider  aspirin 325 MG tablet Take 1 tablet (325 mg total) by mouth daily. 06/09/13  Yes Rodolph Bong, MD  carvedilol (COREG) 12.5 MG tablet Take 1 tablet (  12.5 mg total) by mouth 2 (two) times daily with a meal. 06/09/13  Yes Rodolph Bonganiel Thompson V, MD  citalopram (CELEXA) 20 MG tablet Take 40 mg by mouth daily.   Yes Historical Provider, MD  Cyanocobalamin (RA VITAMIN B-12 TR) 1000 MCG TBCR Take 1 tablet by mouth daily.    Yes Historical Provider, MD  Fluticasone-Salmeterol (ADVAIR) 250-50 MCG/DOSE AEPB Inhale 1 puff into the lungs 2 (two) times daily.   Yes Historical Provider, MD  furosemide (LASIX) 40 MG tablet Take 40 mg by mouth daily.   Yes Historical Provider, MD  nystatin (NYSTATIN) powder Apply topically 2 (two) times daily.   Yes Historical Provider, MD  omeprazole (PRILOSEC) 20 MG capsule Take 20 mg by mouth  daily.   Yes Historical Provider, MD  senna (SENOKOT) 8.6 MG TABS tablet Take 1 tablet by mouth at bedtime.   Yes Historical Provider, MD  simvastatin (ZOCOR) 20 MG tablet Take 20 mg by mouth every evening.    Yes Historical Provider, MD  tiotropium (SPIRIVA) 18 MCG inhalation capsule Place 18 mcg into inhaler and inhale daily.   Yes Historical Provider, MD  tobramycin (TOBREX) 0.3 % ophthalmic solution Place 1 drop into both eyes 4 (four) times daily.   Yes Historical Provider, MD      VITAL SIGNS:  Blood pressure 198/113, pulse 86, temperature 98.2 F (36.8 C), temperature source Oral, resp. rate 18, SpO2 95 %.  PHYSICAL EXAMINATION:  GENERAL:  82 y.o.-year-old patient lying in the bed with no acute distress.  EYES: Pupils equal, round, reactive to light and accommodation. No scleral icterus. Extraocular muscles intact.  HEENT: Head atraumatic, normocephalic. Oropharynx and nasopharynx clear. Dry oral mucosa. NECK:  Supple, no jugular venous distention. No thyroid enlargement, no tenderness.  LUNGS: Normal breath sounds bilaterally, no wheezing, rales,rhonchi or crepitation. No use of accessory muscles of respiration.  CARDIOVASCULAR: S1, S2 normal. No murmurs, rubs, or gallops.  ABDOMEN: Soft, nontender, nondistended. Bowel sounds present. No organomegaly or mass.  EXTREMITIES: No pedal edema, cyanosis, or clubbing.  NEUROLOGIC: Cranial nerves II through XII are intact. Muscle strength 4/5 in all extremities except the right leg. Sensation intact. Gait not checked.  PSYCHIATRIC: The patient is alert and oriented x 2.  SKIN: No obvious rash, lesion, or ulcer.   LABORATORY PANEL:   CBC  Recent Labs Lab 09/30/15 1629  WBC 9.1  HGB 13.3  HCT 40.3  PLT 192   ------------------------------------------------------------------------------------------------------------------  Chemistries   Recent Labs Lab 09/30/15 1629  NA 140  K 3.7  CL 100*  CO2 34*  GLUCOSE 115*  BUN 16   CREATININE 1.18*  CALCIUM 8.7*  AST 15  ALT 9*  ALKPHOS 69  BILITOT 0.4   ------------------------------------------------------------------------------------------------------------------  Cardiac Enzymes  Recent Labs Lab 09/30/15 1629  TROPONINI <0.03   ------------------------------------------------------------------------------------------------------------------  RADIOLOGY:  Dg Chest 1 View  09/30/2015  CLINICAL DATA:  Right hip fracture. Pre-op respiratory exam previous stroke and myocardial infarct. Has been hypertension. EXAM: CHEST 1 VIEW COMPARISON:  02/09/2014 and CT on 10/17/2012 FINDINGS: Mild to moderate cardiomegaly stable. Small to moderate hiatal hernia again seen. No evidence of mediastinal widening or tracheal deviation. No evidence of pneumothorax or hemothorax. Chronic pulmonary interstitial prominence noted. No evidence of acute infiltrate. IMPRESSION: Stable cardiomegaly, hiatal hernia, and chronic pulmonary interstitial prominence. No acute findings. Electronically Signed   By: Myles RosenthalJohn  Stahl M.D.   On: 09/30/2015 17:37   Dg Hip Unilat With Pelvis 2-3 Views Right  09/30/2015  CLINICAL  DATA:  Unwitnessed fall.  Right hip pain. EXAM: DG HIP (WITH OR WITHOUT PELVIS) 2-3V RIGHT COMPARISON:  None. FINDINGS: A comminuted intratrochanteric fracture is present with increased varus angulation. Pelvis is intact. Atherosclerotic disease is noted. IMPRESSION: Comminuted intertrochanteric fracture. Electronically Signed   By: Marin Roberts M.D.   On: 09/30/2015 17:38    EKG:   Orders placed or performed during the hospital encounter of 09/30/15  . ED EKG  . ED EKG    IMPRESSION AND PLAN:   Right hip fracture.  Admitted to medical floor. Moderate to high risk for hip surgery. Nothing by mouth after midnight for right hip surgery. Hold aspirin and heparin DVT prophylaxis for surgery. Pain control, PT and DVT prophylaxis after surgery.  UTI. Continue Rocephin  and follow-up urine culture.  Hypertension emergency. Start IV hydralazine when necessary, continue home hypertension medication Coreg but hold Lasix. Add norvasc.  History of CVA and heart attack. Hold aspirin for now.  All the records are reviewed and case discussed with ED provider. Management plans discussed with the patient's daughter [facility]) and son and they are in agreement.  CODE STATUS: full code.  TOTAL TIME TAKING CARE OF THIS PATIENT: 55 minutes.    Shaune Pollack M.D on 09/30/2015 at 6:15 PM  Between 7am to 6pm - Pager - [phone]  After 6pm go to [email or web address] - password [email or web address] [facility]  [facility]  Office  [phone]  CC: Primary care physician; BABAOFF, Lavada Mesi, MD

## 2015-09-30 NOTE — ED Notes (Signed)
Transporting patient to 1A room 143

## 2015-09-30 NOTE — ED Notes (Signed)
Patient transported to X-ray 

## 2015-09-30 NOTE — ED Notes (Signed)
[name] reports unwitnessed fall, pt was found on floor with walker on top of her. Pt has hx of dementia and is unable to recall events. Reports R hip pain, some shortening of hip noticed

## 2015-09-30 NOTE — Progress Notes (Signed)
LCSW has collected all information to complete assessment and will complete assessment tomorrow. Time constraints.  Delta Air LinesClaudine Anaika Santillano LCSW [phone]

## 2015-09-30 NOTE — Clinical Social Work Note (Signed)
Clinical Social Work Assessment  Patient Details  Name: Daisy [name] MRN: [id number] Date of Birth: 07/24/1921  Date of referral:  09/30/15               Reason for consult:  Facility Placement                Permission sought to share information with:  Family Supports, Customer service manager Permission granted to share information::  Yes, Verbal Permission Granted  Name::     HCPOA [place] [phone] Gasper S[place] [phone]  Agency::  [facility]   Relationship::  yes  Contact Information:  yes  Housing/Transportation Living arrangements for the past 2 months:  [place] of Information:  Patient Patient Interpreter Needed:  None Criminal Activity/Legal Involvement Pertinent to Current Situation/Hospitalization:  No - Comment as needed Significant Relationships:  Adult Children Lives with:  Self Do you feel safe going back to the place where you live?  Yes Need for family participation in patient care:  Yes (Comment)  Care giving concerns: The family feels she needs hospitalization at this time   Facilities manager / plan:  LCSW met with family and patient and was informed patient resides at [facility]. Patient requires full assistance with her ADLs with the exception of feeding herself. She prior to her hip injury was using a walker. She has stage 3 dementia and was oriented to self and situation but was unable to remember birthdate. She is incont. Her hearing is poor and she wears glasses. She is verbal but at times disoriented. Patient gave verbal consent to speak to her facility and her children ( 7 of them). Patient is covered under [facility] Medicare. Patient is able to return to [facility] when discharged.   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[employer]) PT Recommendations:  Not assessed at this time Information / Referral to community resources:   [place]  Patient/Family's Response to care: Patient comes from a very large caring family and are happy their mother is receiving medical attention  Patient/Family's Understanding of and Emotional Response to Diagnosis, Current Treatment, and Prognosis:  The doctor explained she has a hip fracture  Emotional Assessment Appearance:  Appears stated age Attitude/Demeanor/Rapport:   (Kind and polite and disoriented) Affect (typically observed):  Accepting, Happy Orientation:  Oriented to Self, Fluctuating Orientation (Suspected and/or reported Sundowners) Alcohol / Substance use:  Never Used Psych involvement (Current and /or in the community):  No (Comment)  Discharge Needs  Concerns to be addressed:  No discharge needs identified Readmission within the last 30 days:  No Current discharge risk:  None Barriers to Discharge:  Continued Medical Work up   Joana Reamer, LCSW 09/30/2015, 5:55 PM

## 2015-09-30 NOTE — Consult Note (Signed)
ORTHOPAEDIC CONSULTATION  REQUESTING PHYSICIAN: Shaune Pollack, MD  Chief Complaint: Right hip pain status post fall  HPI: Daisy [name] is a 82 y.o. female who complains of  right hip pain status post fall at [facility] where she is a resident. She is seen in the ER today with her family who helped to provide history. The patient is reported to a reach down to open a refrigerator and lost her balance falling backwards. Her walker landed on top of her. After this injury she was unable to stand or ambulate. She complained of right hip pain. Patient denies other specific injuries but states she feels sore all over. It is unclear whether the patient lost consciousness. She may have hit her head on the ground during the fall.  Past Medical History  Diagnosis Date  . Other and unspecified hyperlipidemia 06/05/2013  . Essential hypertension, benign 06/05/2013  . Depression 06/05/2013  . GERD (gastroesophageal reflux disease) 06/05/2013  . Recurrent UTI   . Paronychia, toe   . Anemia   . Acute renal failure (HCC)   . Cerebral artery occlusion (HCC)     with cerebral infarction  . Dementia   . Asthma   . Edema   . Osteoarthritis   . Stroke (HCC)   . Heart attack [facility])    Past Surgical History  Procedure Laterality Date  . Knee surgery Right   . Cardiac surgery      Left BBB   Social History   Social History  . Marital Status: Married    Spouse Name: N/A  . Number of Children: N/A  . Years of Education: N/A   Social History Main Topics  . Smoking status: Former Games developer  . Smokeless tobacco: Never Used     Comment: quit 50 + years  . Alcohol Use: 0.0 oz/week    0 Standard drinks or equivalent per week     Comment: occasionally  . Drug Use: No  . Sexual Activity: No   Other Topics Concern  . None   Social History Narrative   Family History  Problem Relation Age of Onset  . Prostate cancer Son   . Kidney disease Neg Hx   . Bladder Cancer Neg Hx   . Cancer Mother    Oral  . Colon cancer Sister    Allergies  Allergen Reactions  . Aricept [Donepezil Hcl] Other (See Comments)    "has nightmares"  . Codeine     REACTION: Pt. unsure.  Marland Kitchen Ketek [Telithromycin] Other (See Comments)    "fearful state, not herself"   Prior to Admission medications   Medication Sig Start Date End Date Taking? Authorizing Provider  aspirin 325 MG tablet Take 1 tablet (325 mg total) by mouth daily. 06/09/13  Yes Rodolph Bong, MD  carvedilol (COREG) 12.5 MG tablet Take 1 tablet (12.5 mg total) by mouth 2 (two) times daily with a meal. 06/09/13  Yes Rodolph Bong, MD  citalopram (CELEXA) 20 MG tablet Take 40 mg by mouth daily.   Yes Historical Provider, MD  Cyanocobalamin (RA VITAMIN B-12 TR) 1000 MCG TBCR Take 1 tablet by mouth daily.    Yes Historical Provider, MD  Fluticasone-Salmeterol (ADVAIR) 250-50 MCG/DOSE AEPB Inhale 1 puff into the lungs 2 (two) times daily.   Yes Historical Provider, MD  furosemide (LASIX) 40 MG tablet Take 40 mg by mouth daily.   Yes Historical Provider, MD  nystatin (NYSTATIN) powder Apply topically 2 (two) times daily.   Yes  Historical Provider, MD  omeprazole (PRILOSEC) 20 MG capsule Take 20 mg by mouth daily.   Yes Historical Provider, MD  senna (SENOKOT) 8.6 MG TABS tablet Take 1 tablet by mouth at bedtime.   Yes Historical Provider, MD  simvastatin (ZOCOR) 20 MG tablet Take 20 mg by mouth every evening.    Yes Historical Provider, MD  tiotropium (SPIRIVA) 18 MCG inhalation capsule Place 18 mcg into inhaler and inhale daily.   Yes Historical Provider, MD  tobramycin (TOBREX) 0.3 % ophthalmic solution Place 1 drop into both eyes 4 (four) times daily.   Yes Historical Provider, MD   Dg Chest 1 View  09/30/2015  CLINICAL DATA:  Right hip fracture. Pre-op respiratory exam previous stroke and myocardial infarct. Has been hypertension. EXAM: CHEST 1 VIEW COMPARISON:  02/09/2014 and CT on 10/17/2012 FINDINGS: Mild to moderate cardiomegaly stable.  Small to moderate hiatal hernia again seen. No evidence of mediastinal widening or tracheal deviation. No evidence of pneumothorax or hemothorax. Chronic pulmonary interstitial prominence noted. No evidence of acute infiltrate. IMPRESSION: Stable cardiomegaly, hiatal hernia, and chronic pulmonary interstitial prominence. No acute findings. Electronically Signed   By: Myles Rosenthal M.D.   On: 09/30/2015 17:37   Dg Hip Unilat With Pelvis 2-3 Views Right  09/30/2015  CLINICAL DATA:  Unwitnessed fall.  Right hip pain. EXAM: DG HIP (WITH OR WITHOUT PELVIS) 2-3V RIGHT COMPARISON:  None. FINDINGS: A comminuted intratrochanteric fracture is present with increased varus angulation. Pelvis is intact. Atherosclerotic disease is noted. IMPRESSION: Comminuted intertrochanteric fracture. Electronically Signed   By: Marin Roberts M.D.   On: 09/30/2015 17:38    Positive ROS: All other systems have been reviewed and were otherwise negative with the exception of those mentioned in the HPI and as above.  Physical Exam: General: Alert, no acute distress.  Patient answers questions and follows commands appropriately.  MUSCULOSKELETAL: Right hip/lower extremity: Patient's skin is intact. There is no erythema or ecchymosis or swelling. Her thigh and leg compartments are soft and compressible. She has bilateral lower extremity edema, which according to the family is chronic. She has shortening and external rotation to the right lower extremity. She has palpable pedal pulses. She can flex and extend her toes and dorsiflex and plantarflex her ankle.  Assessment: Right displaced intertrochanteric hip fracture  Plan: I explained to the patient and the family the patient's injury. They understand she has a displaced hip fracture which requires surgical intervention in order to give the patient a chance to return to standing and ambulating. I have recommended intramedullary fixation for the right hip fracture. I explained  the details of this operation as well as the postoperative course. I also explained to the family and the patient the risks of the operation. They understand the risks include infection, bleeding requiring blood transfusion, nerve or blood vessel injury, leg length discrepancy or change in lower extremity rotation, malunion, nonunion, persistent hip pain, failure of the hardware, migration of the hardware or cut out, and the need for further surgery. Medical risks include but are not limited to DVT and pulmonary embolism, myocardial infarction, stroke, pneumonia, respiratory failure and death. Patient and the family understood these risks and wished to proceed. I reviewed all laboratory radiographs studies in preparation for this case. Patient is awaiting medical clearance. Surgery will be performed tomorrow pending medical clearance. I have ordered a CT of the head and neck based on the patient's fall and possible head injury.    Juanell Fairly, MD  09/30/2015 6:30 PM

## 2015-09-30 NOTE — ED Provider Notes (Signed)
[facility] Emergency Department Provider Note        Time seen: ----------------------------------------- 4:28 PM on 09/30/2015 -----------------------------------------  L5 caveat: Review of systems and history is limited by dementia.  I have reviewed the triage vital signs and the nursing notes.   HISTORY  Chief Complaint Fall    HPI Daisy [name] is a 82 y.o. female who presents ER after an unwitnessed fall at the nursing home. Patient was found on the floor with a walker on top of her. She has a history of dementia and cannot recall recent events. She does have severe right hip pain with movement, otherwise denies complaints currently.   Past Medical History  Diagnosis Date  . Other and unspecified hyperlipidemia 06/05/2013  . Essential hypertension, benign 06/05/2013  . Depression 06/05/2013  . GERD (gastroesophageal reflux disease) 06/05/2013  . Recurrent UTI   . Paronychia, toe   . Anemia   . Acute renal failure (HCC)   . Cerebral artery occlusion (HCC)     with cerebral infarction  . Dementia   . Asthma   . Edema   . Osteoarthritis   . Stroke (HCC)   . Heart attack [facility](HCC)     Patient Active Problem List   Diagnosis Date Noted  . Recurrent UTI 11/12/2014  . Microscopic hematuria 11/12/2014  . Atrophic vaginitis 11/12/2014  . CVA (cerebral infarction) 06/05/2013  . Hypotension, unspecified 06/05/2013  . Other and unspecified hyperlipidemia 06/05/2013  . Acute renal failure (HCC) 06/05/2013  . Leukocytosis, unspecified 06/05/2013  . Essential hypertension, benign 06/05/2013  . Depression 06/05/2013  . GERD (gastroesophageal reflux disease) 06/05/2013    Past Surgical History  Procedure Laterality Date  . Knee surgery Right   . Cardiac surgery      Left BBB    Allergies Aricept; Codeine; and Ketek  Social History Social History  Substance Use Topics  . Smoking status: Former Games developermoker  . Smokeless tobacco: Never Used   Comment: quit 50 + years  . Alcohol Use: 0.0 oz/week    0 Standard drinks or equivalent per week     Comment: occasionally    Review of Systems Unknown, positive for right hip pain currently  ____________________________________________   PHYSICAL EXAM:  VITAL SIGNS: ED Triage Vitals  Enc Vitals Group     BP 09/30/15 1621 208/116 mmHg     Pulse Rate 09/30/15 1621 83     Resp 09/30/15 1621 18     Temp 09/30/15 1621 98.2 F (36.8 C)     Temp Source 09/30/15 1621 Oral     SpO2 09/30/15 1621 91 %     Weight --      Height --      Head Cir --      Peak Flow --      Pain Score --      Pain Loc --      Pain Edu? --      Excl. in GC? --     Constitutional: Alert But disoriented. Well appearing, Mild distress Eyes: Conjunctivae are normal. PERRL. Normal extraocular movements. ENT   Head: Normocephalic and atraumatic.   Nose: No congestion/rhinnorhea.   Mouth/Throat: Mucous membranes are moist.   Neck: No stridor. Cardiovascular: Normal rate, regular rhythm. No murmurs, rubs, or gallops. Respiratory: Normal respiratory effort without tachypnea nor retractions. Breath sounds are clear and equal bilaterally. No wheezes/rales/rhonchi. Gastrointestinal: Soft and nontender. Normal bowel sounds Musculoskeletal: Right lower extremity is shortened and externally rotated. Severe  pain range of motion the right hip Neurologic:  Normal speech and language. No gross focal neurologic deficits are appreciated.  Skin:  Skin is warm, dry and intact. No rash noted. Psychiatric: Mood and affect are normal. Speech and behavior are normal.  ____________________________________________  EKG: Interpreted by me.Normal sinus rhythm rate 86 bpm, first degree AV block, left bundle branch block, left axis deviation.  ____________________________________________  ED COURSE:  Pertinent labs & imaging results that were available during my care of the patient were reviewed by me and  considered in my medical decision making (see chart for details). Patient presents to the ER status post fall. Likely hip fracture. We will obtain basic preoperative testing. ____________________________________________    LABS (pertinent positives/negatives)  Labs Reviewed  COMPREHENSIVE METABOLIC PANEL - Abnormal; Notable for the following:    Chloride 100 (*)    CO2 34 (*)    Glucose, Bld 115 (*)    Creatinine, Ser 1.18 (*)    Calcium 8.7 (*)    ALT 9 (*)    GFR calc non Af Amer 39 (*)    GFR calc Af Amer 45 (*)    All other components within normal limits  URINALYSIS COMPLETEWITH MICROSCOPIC ([facility] ONLY) - Abnormal; Notable for the following:    Color, Urine YELLOW (*)    APPearance HAZY (*)    Hgb urine dipstick 1+ (*)    Nitrite POSITIVE (*)    Leukocytes, UA 2+ (*)    Bacteria, UA RARE (*)    Squamous Epithelial / LPF 6-30 (*)    All other components within normal limits  CBC WITH DIFFERENTIAL/PLATELET  TROPONIN I  PROTIME-INR  TYPE AND SCREEN  ABO/RH    RADIOLOGY Images were viewed by me  Right hip x-ray, chest x-ray Reveal right hip fracture ____________________________________________  FINAL ASSESSMENT AND PLAN  Fall, hip fracture, UTI  Plan: Patient with labs and imaging as dictated above. Patient with right hip fracture here, will consult with the pedis and have the patient minute to the hospitalist service. She will also need treatment for UTI.   Emily Filbert, MD   Note: This dictation was prepared with Dragon dictation. Any transcriptional errors that result from this process are unintentional   Emily Filbert, MD 09/30/15 1739

## 2015-09-30 NOTE — ED Notes (Signed)
Attempted to call report X 1. 

## 2015-10-01 ENCOUNTER — Inpatient Hospital Stay: Payer: PPO | Admitting: Anesthesiology

## 2015-10-01 ENCOUNTER — Inpatient Hospital Stay: Payer: PPO

## 2015-10-01 ENCOUNTER — Encounter
Admission: EM | Disposition: A | Discharge status: Skilled Nursing Facility | Payer: Self-pay | Source: Home / Self Care | Attending: Internal Medicine

## 2015-10-01 DIAGNOSIS — N390 Urinary tract infection, site not specified: Secondary | ICD-10-CM | POA: Diagnosis not present

## 2015-10-01 DIAGNOSIS — I161 Hypertensive emergency: Secondary | ICD-10-CM | POA: Diagnosis not present

## 2015-10-01 DIAGNOSIS — I10 Essential (primary) hypertension: Secondary | ICD-10-CM | POA: Diagnosis not present

## 2015-10-01 DIAGNOSIS — S72141A Displaced intertrochanteric fracture of right femur, initial encounter for closed fracture: Secondary | ICD-10-CM | POA: Diagnosis not present

## 2015-10-01 DIAGNOSIS — R739 Hyperglycemia, unspecified: Secondary | ICD-10-CM | POA: Diagnosis not present

## 2015-10-01 DIAGNOSIS — S72001A Fracture of unspecified part of neck of right femur, initial encounter for closed fracture: Secondary | ICD-10-CM | POA: Diagnosis not present

## 2015-10-01 HISTORY — PX: FEMUR IM NAIL: SHX1597

## 2015-10-01 LAB — CBC
HCT: 37.1 % (ref 35.0–47.0)
Hemoglobin: 12.2 g/dL (ref 12.0–16.0)
MCH: 29.8 pg (ref 26.0–34.0)
MCHC: 32.8 g/dL (ref 32.0–36.0)
MCV: 90.8 fL (ref 80.0–100.0)
PLATELETS: 179 10*3/uL (ref 150–440)
RBC: 4.09 MIL/uL (ref 3.80–5.20)
RDW: 14.4 % (ref 11.5–14.5)
WBC: 10.5 10*3/uL (ref 3.6–11.0)

## 2015-10-01 LAB — BASIC METABOLIC PANEL
Anion gap: 7 (ref 5–15)
BUN: 14 mg/dL (ref 6–20)
CO2: 32 mmol/L (ref 22–32)
CREATININE: 1.01 mg/dL — AB (ref 0.44–1.00)
Calcium: 8.7 mg/dL — ABNORMAL LOW (ref 8.9–10.3)
Chloride: 102 mmol/L (ref 101–111)
GFR calc Af Amer: 55 mL/min — ABNORMAL LOW (ref >60)
GFR, EST NON AFRICAN AMERICAN: 47 mL/min — AB (ref >60)
Glucose, Bld: 149 mg/dL — ABNORMAL HIGH (ref 65–99)
Potassium: 3.8 mmol/L (ref 3.5–5.1)
SODIUM: 141 mmol/L (ref 135–145)

## 2015-10-01 LAB — HEMOGLOBIN A1C: HEMOGLOBIN A1C: 5.7 % (ref 4.0–6.0)

## 2015-10-01 SURGERY — INSERTION, INTRAMEDULLARY ROD, FEMUR
Anesthesia: General | Laterality: Right

## 2015-10-01 MED ORDER — ONDANSETRON HCL 4 MG/2ML IJ SOLN
INTRAMUSCULAR | Status: DC | PRN
  Administered 2015-10-01: 4 mg via INTRAVENOUS

## 2015-10-01 MED ORDER — ACETAMINOPHEN 325 MG PO TABS
650.0000 mg | ORAL_TABLET | Freq: Four times a day (QID) | ORAL | Status: DC | PRN
  Administered 2015-10-02 – 2015-10-04 (×2): 650 mg via ORAL
  Filled 2015-10-01 (×2): qty 2

## 2015-10-01 MED ORDER — PHENOL 1.4 % MT LIQD
1.0000 | OROMUCOSAL | Status: DC | PRN
  Filled 2015-10-01: qty 177

## 2015-10-01 MED ORDER — SUGAMMADEX SODIUM 200 MG/2ML IV SOLN
INTRAVENOUS | Status: DC | PRN
  Administered 2015-10-01: 172 mg via INTRAVENOUS

## 2015-10-01 MED ORDER — SODIUM CHLORIDE 0.9 % IV SOLN
10000.0000 ug | INTRAVENOUS | Status: DC | PRN
  Administered 2015-10-01: 25 ug/min via INTRAVENOUS

## 2015-10-01 MED ORDER — POLYETHYLENE GLYCOL 3350 17 G PO PACK
17.0000 g | PACK | Freq: Every day | ORAL | Status: DC | PRN
  Administered 2015-10-02 – 2015-10-03 (×2): 17 g via ORAL
  Filled 2015-10-01 (×2): qty 1

## 2015-10-01 MED ORDER — SUCCINYLCHOLINE CHLORIDE 20 MG/ML IJ SOLN
INTRAMUSCULAR | Status: DC | PRN
  Administered 2015-10-01: 120 mg via INTRAVENOUS

## 2015-10-01 MED ORDER — NEOMYCIN-POLYMYXIN B GU 40-200000 IR SOLN
Status: DC | PRN
  Administered 2015-10-01: 4 mL

## 2015-10-01 MED ORDER — SENNA 8.6 MG PO TABS
1.0000 | ORAL_TABLET | Freq: Two times a day (BID) | ORAL | Status: DC
  Administered 2015-10-01 – 2015-10-04 (×6): 8.6 mg via ORAL
  Filled 2015-10-01 (×8): qty 1

## 2015-10-01 MED ORDER — ONDANSETRON HCL 4 MG/2ML IJ SOLN: 4.0000 mg | Freq: Four times a day (QID) | INTRAMUSCULAR | Status: DC | PRN

## 2015-10-01 MED ORDER — CEFAZOLIN SODIUM-DEXTROSE 2-3 GM-% IV SOLR
INTRAVENOUS | Status: DC | PRN
  Administered 2015-10-01: 2 g via INTRAVENOUS

## 2015-10-01 MED ORDER — PHENYLEPHRINE HCL 10 MG/ML IJ SOLN
INTRAMUSCULAR | Status: DC | PRN
  Administered 2015-10-01 (×4): 100 ug via INTRAVENOUS

## 2015-10-01 MED ORDER — ONDANSETRON HCL 4 MG PO TABS: 4.0000 mg | ORAL_TABLET | Freq: Four times a day (QID) | ORAL | Status: DC | PRN

## 2015-10-01 MED ORDER — PROPOFOL 500 MG/50ML IV EMUL
INTRAVENOUS | Status: DC | PRN
  Administered 2015-10-01: 40 ug/kg/min via INTRAVENOUS

## 2015-10-01 MED ORDER — ACETAMINOPHEN 650 MG RE SUPP: 650.0000 mg | Freq: Four times a day (QID) | RECTAL | Status: DC | PRN

## 2015-10-01 MED ORDER — FERROUS SULFATE 325 (65 FE) MG PO TABS
325.0000 mg | ORAL_TABLET | Freq: Three times a day (TID) | ORAL | Status: DC
  Administered 2015-10-01 – 2015-10-04 (×9): 325 mg via ORAL
  Filled 2015-10-01 (×9): qty 1

## 2015-10-01 MED ORDER — FENTANYL CITRATE (PF) 100 MCG/2ML IJ SOLN: 25.0000 ug | INTRAMUSCULAR | Status: DC | PRN

## 2015-10-01 MED ORDER — BUPIVACAINE HCL (PF) 0.5 % IJ SOLN: INTRAMUSCULAR | Status: DC | PRN

## 2015-10-01 MED ORDER — MORPHINE SULFATE (PF) 2 MG/ML IV SOLN
2.0000 mg | INTRAVENOUS | Status: DC | PRN
  Administered 2015-10-01: 2 mg via INTRAVENOUS
  Filled 2015-10-01: qty 1

## 2015-10-01 MED ORDER — EPHEDRINE SULFATE 50 MG/ML IJ SOLN
INTRAMUSCULAR | Status: DC | PRN
  Administered 2015-10-01 (×2): 5 mg via INTRAVENOUS

## 2015-10-01 MED ORDER — OXYCODONE HCL 5 MG PO TABS
5.0000 mg | ORAL_TABLET | ORAL | Status: DC | PRN
  Administered 2015-10-01: 5 mg via ORAL
  Administered 2015-10-02: 10 mg via ORAL
  Administered 2015-10-02: 5 mg via ORAL
  Administered 2015-10-02: 10 mg via ORAL
  Administered 2015-10-02: 5 mg via ORAL
  Administered 2015-10-03 (×2): 10 mg via ORAL
  Filled 2015-10-01: qty 1
  Filled 2015-10-01 (×3): qty 2
  Filled 2015-10-01: qty 1
  Filled 2015-10-01: qty 2
  Filled 2015-10-01: qty 1

## 2015-10-01 MED ORDER — VANCOMYCIN HCL 1000 MG IV SOLR
1000.0000 mg | INTRAVENOUS | Status: DC | PRN
  Administered 2015-10-01: 1000 mg via INTRAVENOUS

## 2015-10-01 MED ORDER — VANCOMYCIN HCL IN DEXTROSE 1-5 GM/200ML-% IV SOLN
1000.0000 mg | Freq: Two times a day (BID) | INTRAVENOUS | Status: DC
  Filled 2015-10-01: qty 200

## 2015-10-01 MED ORDER — PROPOFOL 10 MG/ML IV BOLUS
INTRAVENOUS | Status: DC | PRN
  Administered 2015-10-01: 75 mg via INTRAVENOUS

## 2015-10-01 MED ORDER — ROCURONIUM BROMIDE 100 MG/10ML IV SOLN
INTRAVENOUS | Status: DC | PRN
  Administered 2015-10-01: 10 mg via INTRAVENOUS

## 2015-10-01 MED ORDER — BUPIVACAINE HCL (PF) 0.5 % IJ SOLN
INTRAMUSCULAR | Status: DC | PRN
  Administered 2015-10-01: 2.6 mL

## 2015-10-01 MED ORDER — DOCUSATE SODIUM 100 MG PO CAPS
100.0000 mg | ORAL_CAPSULE | Freq: Two times a day (BID) | ORAL | Status: DC
Start: 2015-10-01 — End: 2015-10-04
  Administered 2015-10-01 – 2015-10-04 (×6): 100 mg via ORAL
  Filled 2015-10-01 (×6): qty 1

## 2015-10-01 MED ORDER — ALUM & MAG HYDROXIDE-SIMETH 200-200-20 MG/5ML PO SUSP: 30.0000 mL | ORAL | Status: DC | PRN

## 2015-10-01 MED ORDER — BISACODYL 10 MG RE SUPP
10.0000 mg | Freq: Every day | RECTAL | Status: DC | PRN
  Administered 2015-10-04: 10 mg via RECTAL
  Filled 2015-10-01 (×2): qty 1

## 2015-10-01 MED ORDER — MENTHOL 3 MG MT LOZG
1.0000 | LOZENGE | OROMUCOSAL | Status: DC | PRN
  Filled 2015-10-01: qty 9

## 2015-10-01 MED ORDER — SODIUM CHLORIDE 0.9 % IV SOLN
75.0000 mL/h | INTRAVENOUS | Status: DC
  Administered 2015-10-01 – 2015-10-02 (×2): 75 mL/h via INTRAVENOUS

## 2015-10-01 MED ORDER — ACETAMINOPHEN 10 MG/ML IV SOLN
INTRAVENOUS | Status: AC
  Filled 2015-10-01: qty 100

## 2015-10-01 MED ORDER — LACTATED RINGERS IV SOLN
INTRAVENOUS | Status: DC | PRN
  Administered 2015-10-01: 12:00:00 via INTRAVENOUS

## 2015-10-01 MED ORDER — DEXAMETHASONE SODIUM PHOSPHATE 10 MG/ML IJ SOLN
INTRAMUSCULAR | Status: DC | PRN
  Administered 2015-10-01: 5 mg via INTRAVENOUS

## 2015-10-01 MED ORDER — VANCOMYCIN HCL IN DEXTROSE 1-5 GM/200ML-% IV SOLN
1000.0000 mg | Freq: Once | INTRAVENOUS | Status: AC
  Administered 2015-10-02: 1000 mg via INTRAVENOUS
  Filled 2015-10-01: qty 200

## 2015-10-01 MED ORDER — VANCOMYCIN HCL 1000 MG IV SOLR
INTRAVENOUS | Status: AC
  Filled 2015-10-01: qty 1000

## 2015-10-01 MED ORDER — MAGNESIUM CITRATE PO SOLN
1.0000 | Freq: Once | ORAL | Status: DC | PRN
  Filled 2015-10-01: qty 296

## 2015-10-01 MED ORDER — MIDAZOLAM HCL 5 MG/5ML IJ SOLN
INTRAMUSCULAR | Status: DC | PRN
  Administered 2015-10-01: 0.5 mg via INTRAVENOUS

## 2015-10-01 MED ORDER — NEOMYCIN-POLYMYXIN B GU 40-200000 IR SOLN
Status: AC
  Filled 2015-10-01: qty 4

## 2015-10-01 MED ORDER — ONDANSETRON HCL 4 MG/2ML IJ SOLN: 4.0000 mg | Freq: Once | INTRAMUSCULAR | Status: DC | PRN

## 2015-10-01 MED ORDER — ENOXAPARIN SODIUM 40 MG/0.4ML ~~LOC~~ SOLN
40.0000 mg | SUBCUTANEOUS | Status: DC
  Administered 2015-10-02 – 2015-10-04 (×3): 40 mg via SUBCUTANEOUS
  Filled 2015-10-01 (×3): qty 0.4

## 2015-10-01 SURGICAL SUPPLY — 36 items
BIT DRILL CANN LG 4.3MM (BIT) ×1 IMPLANT
BNDG COHESIVE 6X5 TAN STRL LF (GAUZE/BANDAGES/DRESSINGS) ×4 IMPLANT
CANISTER SUCT 1200ML W/VALVE (MISCELLANEOUS) ×2 IMPLANT
DRAPE SHEET LG 3/4 BI-LAMINATE (DRAPES) ×4 IMPLANT
DRAPE SURG 17X11 SM STRL (DRAPES) ×4 IMPLANT
DRAPE U-SHAPE 47X51 STRL (DRAPES) ×2 IMPLANT
DRILL BIT CANN LG 4.3MM (BIT) ×2
DRSG OPSITE POSTOP 4X12 (GAUZE/BANDAGES/DRESSINGS) ×2 IMPLANT
DRSG OPSITE POSTOP 4X14 (GAUZE/BANDAGES/DRESSINGS) ×2 IMPLANT
DURAPREP 26ML APPLICATOR (WOUND CARE) ×4 IMPLANT
ELECT REM PT RETURN 9FT ADLT (ELECTROSURGICAL) ×2
ELECTRODE REM PT RTRN 9FT ADLT (ELECTROSURGICAL) ×1 IMPLANT
GLOVE BIOGEL PI IND STRL 9 (GLOVE) ×1 IMPLANT
GLOVE BIOGEL PI INDICATOR 9 (GLOVE) ×1
GLOVE SURG 9.0 ORTHO LTXF (GLOVE) ×8 IMPLANT
GOWN STRL REUS TWL 2XL XL LVL4 (GOWN DISPOSABLE) ×2 IMPLANT
GOWN STRL REUS W/ TWL LRG LVL3 (GOWN DISPOSABLE) ×1 IMPLANT
GOWN STRL REUS W/TWL LRG LVL3 (GOWN DISPOSABLE) ×1
GUIDEPIN VERSANAIL DSP 3.2X444 ×2 IMPLANT
HEMOVAC 400CC 10FR (MISCELLANEOUS) IMPLANT
HFN 125 DEG 9MM X 180MM (Nail) ×2 IMPLANT
HIP FRA NAIL LAG SCREW 10.5X90 (Orthopedic Implant) ×2 IMPLANT
KIT RM TURNOVER CYSTO AR (KITS) ×2 IMPLANT
MAT BLUE FLOOR 46X72 FLO (MISCELLANEOUS) ×2 IMPLANT
NS IRRIG 1000ML POUR BTL (IV SOLUTION) ×2 IMPLANT
PACK HIP COMPR (MISCELLANEOUS) ×2 IMPLANT
SCREW BONE CORTICAL 5.0X36 (Screw) ×2 IMPLANT
SCREW LAG HIP FRA NAIL 10.5X90 (Orthopedic Implant) ×1 IMPLANT
STAPLER SKIN PROX 35W (STAPLE) ×2 IMPLANT
SUCTION FRAZIER HANDLE 10FR (MISCELLANEOUS) ×1
SUCTION TUBE FRAZIER 10FR DISP (MISCELLANEOUS) ×1 IMPLANT
SUT VIC AB 0 CT1 36 (SUTURE) ×4 IMPLANT
SUT VIC AB 2-0 CT1 27 (SUTURE) ×1
SUT VIC AB 2-0 CT1 TAPERPNT 27 (SUTURE) ×1 IMPLANT
SUT VICRYL 0 AB UR-6 (SUTURE) ×2 IMPLANT
SYR 30ML LL (SYRINGE) ×2 IMPLANT

## 2015-10-01 NOTE — Progress Notes (Signed)
Pt did well after surgery. Alert and oriented to family members. Denies pain. F/C patent and draining with moderate output. Family at bedside.

## 2015-10-01 NOTE — Progress Notes (Signed)
[facility] [place] [facility]   PATIENT NAME: Daisy [name]    MR#:  [id number]  DATE OF BIRTH:  13-Apr-1922  SUBJECTIVE:  CHIEF COMPLAINT:   Chief Complaint  Patient presents with  . Fall  The patient is a 82 year old Caucasian female with past history significant for history of dementia, hyperlipidemia, and 9, hypertension, depression, stroke, heart attack, who presents to the hospital with complaints of right hip pain after fall. On arrival to emergency room, patient was noted to have comminuted intratrochanteric fracture, she was seen by orthopedist surgeon, who recommended intramedullary fixation of right hip fracture. Patient was given some pain medications, however, remains in significant discomfort and states that she feels terrible. Not able to provide review of systems due to dementia and pain  Review of Systems  Unable to perform ROS: dementia    VITAL SIGNS: Blood pressure 110/60, pulse 88, temperature 98 F (36.7 C), temperature source Oral, resp. rate 18, height  (1.575 m), weight 85.957 kg (189 lb 8 oz), SpO2 96 %.  PHYSICAL EXAMINATION:   GENERAL:  82 y.o.-year-old patient lying in the bed in mild to moderate distress due to pain, however, goes to sleep intermittently whenever not addressed, stimulated.  EYES: Pupils equal, round, reactive to light and accommodation. No scleral icterus. Extraocular muscles intact.  HEENT: Head atraumatic, normocephalic. Oropharynx and nasopharynx clear.  NECK:  Supple, no jugular venous distention. No thyroid enlargement, no tenderness.  LUNGS: Normal breath sounds bilaterally, no wheezing, rales,rhonchi or crepitation. No use of accessory muscles of respiration.  CARDIOVASCULAR: S1, S2 normal. No murmurs, rubs, or gallops.  ABDOMEN: Soft, nontender, nondistended. Bowel sounds present. No organomegaly or mass.  EXTREMITIES: No pedal edema, cyanosis, or clubbing. Right lower extremity is shortened and  rotated externally NEUROLOGIC: Cranial nerves II through XII are intact. Muscle strength 5/5 in all extremities. Sensation intact. Gait not checked.  PSYCHIATRIC: The patient is alert and oriented x 2.  SKIN: No obvious rash, lesion, or ulcer.   ORDERS/RESULTS REVIEWED:   CBC  Recent Labs Lab 09/30/15 1629 10/01/15 0421  WBC 9.1 10.5  HGB 13.3 12.2  HCT 40.3 37.1  PLT 192 179  MCV 90.7 90.8  MCH 29.8 29.8  MCHC 32.9 32.8  RDW 14.5 14.4  LYMPHSABS 1.5  --   MONOABS 0.9  --   EOSABS 0.3  --   BASOSABS 0.1  --    ------------------------------------------------------------------------------------------------------------------  Chemistries   Recent Labs Lab 09/30/15 1629 10/01/15 0421  NA 140 141  K 3.7 3.8  CL 100* 102  CO2 34* 32  GLUCOSE 115* 149*  BUN 16 14  CREATININE 1.18* 1.01*  CALCIUM 8.7* 8.7*  AST 15  --   ALT 9*  --   ALKPHOS 69  --   BILITOT 0.4  --    ------------------------------------------------------------------------------------------------------------------ estimated creatinine clearance is 36.9 mL/min (by C-G formula based on Cr of 1.01). ------------------------------------------------------------------------------------------------------------------ No results for input(s): TSH, T4TOTAL, T3FREE, THYROIDAB in the last 72 hours.  Invalid input(s): FREET3  Cardiac Enzymes  Recent Labs Lab 09/30/15 1629  TROPONINI <0.03   ------------------------------------------------------------------------------------------------------------------ Invalid input(s): POCBNP ---------------------------------------------------------------------------------------------------------------  RADIOLOGY: Dg Chest 1 View  09/30/2015  CLINICAL DATA:  Right hip fracture. Pre-op respiratory exam previous stroke and myocardial infarct. Has been hypertension. EXAM: CHEST 1 VIEW COMPARISON:  02/09/2014 and CT on 10/17/2012 FINDINGS: Mild to moderate cardiomegaly  stable. Small to moderate hiatal hernia again seen. No evidence of mediastinal widening or tracheal deviation. No  evidence of pneumothorax or hemothorax. Chronic pulmonary interstitial prominence noted. No evidence of acute infiltrate. IMPRESSION: Stable cardiomegaly, hiatal hernia, and chronic pulmonary interstitial prominence. No acute findings. Electronically Signed   By: Myles Rosenthal M.D.   On: 09/30/2015 17:37   Ct Head Wo Contrast  09/30/2015  CLINICAL DATA:  82 year old female with history of trauma from a fall onto the ground earlier today. EXAM: CT HEAD WITHOUT CONTRAST CT CERVICAL SPINE WITHOUT CONTRAST TECHNIQUE: Multidetector CT imaging of the head and cervical spine was performed following the standard protocol without intravenous contrast. Multiplanar CT image reconstructions of the cervical spine were also generated. COMPARISON:  Head CT 08/21/2013. FINDINGS: CT HEAD FINDINGS Moderate cerebral atrophy. Patchy and confluent areas of decreased attenuation are noted throughout the deep and periventricular white matter of the cerebral hemispheres bilaterally, compatible with chronic microvascular ischemic disease. Old lacunar infarcts in the basal ganglia bilaterally and in the right thalamus again noted. No acute displaced skull fractures are identified. No acute intracranial abnormality. Specifically, no evidence of acute post-traumatic intracranial hemorrhage, no definite regions of acute/subacute cerebral ischemia, no focal mass, mass effect, hydrocephalus or abnormal intra or extra-axial fluid collections. The visualized paranasal sinuses and mastoids are well pneumatized, with exception of small bilateral mastoid effusions which appear to be chronic (similar to prior). CT CERVICAL SPINE FINDINGS No acute displaced fractures of the cervical spine. Prevertebral soft tissue toes are distorted by the presence of retropharyngeal carotid arteries bilaterally, but are otherwise unremarkable in  appearance. There is reversal of normal cervical lordosis centered at the level of C5, which appears to be chronic and is very similar to the prior examination. Likewise, 3 mm of anterolisthesis of C2 upon C3 and 4 mm of anterolisthesis of C4 upon C5 is also unchanged. Chronic fusion of C5-C6 is noted, with developing fusion between C6-C7 slightly increased compared to the prior study. There are multiple other levels of degenerative disc disease throughout the cervical spine, and there is severe multilevel facet arthropathy. Extensive atherosclerosis throughout the visualized vasculature. Visualized portions of the upper thorax are otherwise unremarkable in appearance. IMPRESSION: 1. No evidence of significant acute traumatic injury to the skull, brain or cervical spine. 2. Moderate cerebral atrophy with chronic microvascular ischemic changes in the cerebral white matter and old lacunar infarcts, similar to the prior study, as above. 3. Severe multilevel degenerative disc disease and cervical spondylosis redemonstrated, very similar to prior study 08/21/2013. 4. Small bilateral mastoid effusions appear to be chronic and are similar to the prior study. Electronically Signed   By: Trudie Reed M.D.   On: 09/30/2015 19:10   Ct Cervical Spine Wo Contrast  09/30/2015  CLINICAL DATA:  82 year old female with history of trauma from a fall onto the ground earlier today. EXAM: CT HEAD WITHOUT CONTRAST CT CERVICAL SPINE WITHOUT CONTRAST TECHNIQUE: Multidetector CT imaging of the head and cervical spine was performed following the standard protocol without intravenous contrast. Multiplanar CT image reconstructions of the cervical spine were also generated. COMPARISON:  Head CT 08/21/2013. FINDINGS: CT HEAD FINDINGS Moderate cerebral atrophy. Patchy and confluent areas of decreased attenuation are noted throughout the deep and periventricular white matter of the cerebral hemispheres bilaterally, compatible with chronic  microvascular ischemic disease. Old lacunar infarcts in the basal ganglia bilaterally and in the right thalamus again noted. No acute displaced skull fractures are identified. No acute intracranial abnormality. Specifically, no evidence of acute post-traumatic intracranial hemorrhage, no definite regions of acute/subacute cerebral ischemia, no focal mass, mass effect,  hydrocephalus or abnormal intra or extra-axial fluid collections. The visualized paranasal sinuses and mastoids are well pneumatized, with exception of small bilateral mastoid effusions which appear to be chronic (similar to prior). CT CERVICAL SPINE FINDINGS No acute displaced fractures of the cervical spine. Prevertebral soft tissue toes are distorted by the presence of retropharyngeal carotid arteries bilaterally, but are otherwise unremarkable in appearance. There is reversal of normal cervical lordosis centered at the level of C5, which appears to be chronic and is very similar to the prior examination. Likewise, 3 mm of anterolisthesis of C2 upon C3 and 4 mm of anterolisthesis of C4 upon C5 is also unchanged. Chronic fusion of C5-C6 is noted, with developing fusion between C6-C7 slightly increased compared to the prior study. There are multiple other levels of degenerative disc disease throughout the cervical spine, and there is severe multilevel facet arthropathy. Extensive atherosclerosis throughout the visualized vasculature. Visualized portions of the upper thorax are otherwise unremarkable in appearance. IMPRESSION: 1. No evidence of significant acute traumatic injury to the skull, brain or cervical spine. 2. Moderate cerebral atrophy with chronic microvascular ischemic changes in the cerebral white matter and old lacunar infarcts, similar to the prior study, as above. 3. Severe multilevel degenerative disc disease and cervical spondylosis redemonstrated, very similar to prior study 08/21/2013. 4. Small bilateral mastoid effusions appear to  be chronic and are similar to the prior study. Electronically Signed   By: Trudie Reed M.D.   On: 09/30/2015 19:10   Dg Hip Unilat With Pelvis 2-3 Views Right  09/30/2015  CLINICAL DATA:  Unwitnessed fall.  Right hip pain. EXAM: DG HIP (WITH OR WITHOUT PELVIS) 2-3V RIGHT COMPARISON:  None. FINDINGS: A comminuted intratrochanteric fracture is present with increased varus angulation. Pelvis is intact. Atherosclerotic disease is noted. IMPRESSION: Comminuted intertrochanteric fracture. Electronically Signed   By: Marin Roberts M.D.   On: 09/30/2015 17:38    EKG:  Orders placed or performed during the hospital encounter of 09/30/15  . ED EKG  . ED EKG    ASSESSMENT AND PLAN:  Principal Problem:   Closed right hip fracture (HCC) #1 comminuted intratrochanteric right hip fracture, closed. Initial encounter, continue pain medications, patient is to undergo intramedullary fixation of hip fracture by Dr. Martha Clan later today. I discussed with patient's family, her daughter and her husband risks as well as benefits of surgery, I answered all the questions, they voiced understanding, #2. Urinary tract infection, awaiting for urinary cultures, continue Rocephin intravenously for now #3. CK D stage III, stable with therapy #4. Hyperglycemia, get hemoglobin A1c to rule out diabetes mellitus #5. MRSA carrier, initiate therapy with Bactroban intranasally  Management plans discussed with the patient, family and they are in agreement.   DRUG ALLERGIES:  Allergies  Allergen Reactions  . Aricept [Donepezil Hcl] Other (See Comments)    "has nightmares"  . Codeine     REACTION: Pt. unsure.  Marland Kitchen Ketek [Telithromycin] Other (See Comments)    "fearful state, not herself"    CODE STATUS:     Code Status Orders        Start     Ordered   09/30/15 1745  Full code   Continuous     09/30/15 1746    Code Status History    Date Active Date Inactive Code Status Order ID Comments User  Context   06/05/2013  1:20 AM 06/09/2013  9:41 PM Full Code [id number]  Alison Murray, MD Inpatient    Advance Directive Documentation  Most Recent Value   Type of Advance Directive  Healthcare Power of Attorney, Living will   Pre-existing out of facility DNR order (yellow form or pink MOST form)     "MOST" Form in Place?        TOTAL TIME TAKING CARE OF THIS PATIENT: 40  minutes.  Discussed with patient's family extensively, all questions answered  Araceli Coufal M.D on 10/01/2015 at 12:45 PM  Between 7am to 6pm - Pager - [phone]  After 6pm go to [email or web address] - password [email or web address] [facility]  [facility]  Office  [phone]  CC: Primary care physician; BABAOFF, Lavada MesiMARC E, MD

## 2015-10-01 NOTE — Transfer of Care (Signed)
Immediate Anesthesia Transfer of Care Note  Patient: Daisy [name]  Procedure(s) Performed: Procedure(s): INTRAMEDULLARY (IM) NAIL FEMORAL (Right)  Patient Location: PACU  Anesthesia Type:General  Level of Consciousness: awake  Airway & Oxygen Therapy: Patient Spontanous Breathing and Patient connected to face mask oxygen  Post-op Assessment: Report given to RN and Post -op Vital signs reviewed and stable  Post vital signs: Reviewed and stable  Last Vitals:  Filed Vitals:   10/01/15 1051 10/01/15 1507  BP: 110/60 119/60  Pulse:  73  Temp:  37.2 C  Resp:  14    Last Pain:  Filed Vitals:   10/01/15 1509  PainSc: Asleep         Complications: No apparent anesthesia complications

## 2015-10-01 NOTE — Anesthesia Preprocedure Evaluation (Signed)
Anesthesia Evaluation  Patient identified by MRN, date of birth, ID band Patient confused    Reviewed: Allergy & Precautions, NPO status   Airway Mallampati: III       Dental  (+) Teeth Intact   Pulmonary asthma , former smoker,           Cardiovascular Exercise Tolerance: Poor hypertension, Pt. on medications + DVT   Rhythm:Regular     Neuro/Psych Depression CVA    GI/Hepatic GERD  Medicated,  Endo/Other  Morbid obesity  Renal/GU      Musculoskeletal   Abdominal (+) + obese,   Peds  Hematology  (+) anemia ,   Anesthesia Other Findings   Reproductive/Obstetrics                             Anesthesia Physical Anesthesia Plan  ASA: III  Anesthesia Plan: Spinal   Post-op Pain Management:    Induction: Intravenous  Airway Management Planned: Natural Airway and Nasal Cannula  Additional Equipment:   Intra-op Plan:   Post-operative Plan:   Informed Consent: I have reviewed the patients History and Physical, chart, labs and discussed the procedure including the risks, benefits and alternatives for the proposed anesthesia with the patient or authorized representative who has indicated his/her understanding and acceptance.     Plan Discussed with: CRNA  Anesthesia Plan Comments:         Anesthesia Quick Evaluation

## 2015-10-01 NOTE — NC FL2 (Signed)
[place] MEDICAID FL2 LEVEL OF CARE SCREENING TOOL     IDENTIFICATION  Patient Name: Daisy [name] Birthdate: 10-Sep-1921 Sex: female Admission Date (Current Location): 09/30/2015  [place] Number:  Chiropodist and Address:  [facility], [address] [place], [place] [address]      Provider Number: [id number]  Attending Physician Name and Address:  Katharina Caper, MD  Relative Name and Phone Number:       Current Level of Care: Hospital Recommended Level of Care: Skilled Nursing Facility Prior Approval Number:    Date Approved/Denied:   PASRR Number: [id number]  Discharge Plan: SNF    Current Diagnoses: Patient Active Problem List   Diagnosis Date Noted  . Closed right hip fracture (HCC) 09/30/2015  . Recurrent UTI 11/12/2014  . Microscopic hematuria 11/12/2014  . Atrophic vaginitis 11/12/2014  . CVA (cerebral infarction) 06/05/2013  . Hypotension, unspecified 06/05/2013  . Other and unspecified hyperlipidemia 06/05/2013  . Acute renal failure (HCC) 06/05/2013  . Leukocytosis, unspecified 06/05/2013  . Essential hypertension, benign 06/05/2013  . Depression 06/05/2013  . GERD (gastroesophageal reflux disease) 06/05/2013    Orientation RESPIRATION BLADDER Height & Weight     Self, Situation  Normal Incontinent Weight: 189 lb 8 oz (85.957 kg) Height:   (157.5 cm)  BEHAVIORAL SYMPTOMS/MOOD NEUROLOGICAL BOWEL NUTRITION STATUS      Incontinent Diet (Normal)  AMBULATORY STATUS COMMUNICATION OF NEEDS Skin   Extensive Assist Verbally Normal                       Personal Care Assistance Level of Assistance  Bathing, Feeding, Dressing, Total care Bathing Assistance: Maximum assistance Feeding assistance: Maximum assistance Dressing Assistance: Maximum assistance Total Care Assistance: Maximum assistance   Functional Limitations Info  Sight, Hearing, Speech Sight Info: Adequate Hearing Info:  Impaired Speech Info: Adequate    SPECIAL CARE FACTORS FREQUENCY                       Contractures Contractures Info: Not present    Additional Factors Info  Allergies   Allergies Info: Aricept,coedine,Ketek           Current Medications (10/01/2015):  This is the current hospital active medication list Current Facility-Administered Medications  Medication Dose Route Frequency Provider Last Rate Last Dose  . acetaminophen (TYLENOL) tablet 650 mg  650 mg Oral Q6H PRN Shaune Pollack, MD       Or  . acetaminophen (TYLENOL) suppository 650 mg  650 mg Rectal Q6H PRN Shaune Pollack, MD      . albuterol (PROVENTIL) (2.5 MG/3ML) 0.083% nebulizer solution 2.5 mg  2.5 mg Nebulization Q2H PRN Shaune Pollack, MD      . amLODipine (NORVASC) tablet 10 mg  10 mg Oral Daily Gardner Candle, RPH   10 mg at 09/30/15 2031  . carvedilol (COREG) tablet 12.5 mg  12.5 mg Oral BID WC Shaune Pollack, MD      . ceFAZolin (ANCEF) IVPB 2g/100 mL premix  2 g Intravenous 30 min Pre-Op Juanell Fairly, MD      . cefTRIAXone (ROCEPHIN) 1 g in dextrose 5 % 50 mL IVPB  1 g Intravenous Q24H Sheema M Hallaji, RPH      . Chlorhexidine Gluconate Cloth 2 % PADS 6 each  6 each Topical Q0600 Shaune Pollack, MD   6 each at 10/01/15 0600  . citalopram (CELEXA) tablet 40 mg  40 mg  Oral Daily Sheema M Hallaji, RPH      . hydrALAZINE (APRESOLINE) injection 10 mg  10 mg Intravenous Q6H PRN Shaune PollackQing Chen, MD   10 mg at 09/30/15 2030  . HYDROcodone-acetaminophen (NORCO/VICODIN) 5-325 MG per tablet 1-2 tablet  1-2 tablet Oral Q4H PRN Shaune PollackQing Chen, MD      . mometasone-formoterol [facility](DULERA) 200-5 MCG/ACT inhaler 2 puff  2 puff Inhalation BID Shaune PollackQing Chen, MD   2 puff at 09/30/15 2230  . morphine 2 MG/ML injection 2 mg  2 mg Intravenous Q2H PRN Juanell FairlyKevin Krasinski, MD      . morphine 4 MG/ML injection 4 mg  4 mg Intravenous Q4H PRN Shaune PollackQing Chen, MD      . mupirocin ointment (BACTROBAN) 2 % 1 application  1 application Nasal BID Shaune PollackQing Chen, MD   1 application at  10/01/15 0600  . nystatin (MYCOSTATIN) powder   Topical BID Shaune PollackQing Chen, MD      . ondansetron [facility](ZOFRAN) tablet 4 mg  4 mg Oral Q6H PRN Shaune PollackQing Chen, MD       Or  . ondansetron [facility](ZOFRAN) injection 4 mg  4 mg Intravenous Q6H PRN Shaune PollackQing Chen, MD      . oxyCODONE (Oxy IR/ROXICODONE) immediate release tablet 5-10 mg  5-10 mg Oral Q4H PRN Juanell FairlyKevin Krasinski, MD   5 mg at 09/30/15 2031  . pantoprazole (PROTONIX) EC tablet 40 mg  40 mg Oral Daily Shaune PollackQing Chen, MD   40 mg at 09/30/15 1930  . senna (SENOKOT) tablet 8.6 mg  1 tablet Oral QHS Shaune PollackQing Chen, MD   8.6 mg at 09/30/15 2200  . simvastatin (ZOCOR) tablet 20 mg  20 mg Oral QPM Shaune PollackQing Chen, MD   20 mg at 09/30/15 1930  . tiotropium (SPIRIVA) inhalation capsule 18 mcg  18 mcg Inhalation Daily Shaune PollackQing Chen, MD      . tobramycin (TOBREX) 0.3 % ophthalmic solution 1 drop  1 drop Both Eyes QID Shaune PollackQing Chen, MD   1 drop at 09/30/15 2229  . vitamin B-12 (CYANOCOBALAMIN) tablet 1,000 mcg  1,000 mcg Oral Daily Shaune PollackQing Chen, MD         Discharge Medications: Please see discharge summary for a list of discharge medications.  Relevant Imaging Results:  Relevant Lab Results:   Additional Information SSN [id number]  Cheron SchaumannBandi, Daivion Pape M, [place]

## 2015-10-01 NOTE — Anesthesia Procedure Notes (Addendum)
Spinal Patient location during procedure: OR Start time: 10/01/2015 12:35 PM End time: 10/01/2015 12:45 PM Reason for block: at surgeon's request Staffing Anesthesiologist: Elijio MilesVAN STAVEREN, GIJSBERTUS F Performed by: anesthesiologist  Preanesthetic Checklist Completed: patient identified, site marked, surgical consent, pre-op evaluation, timeout performed, IV checked, risks and benefits discussed, monitors and equipment checked and at surgeon's request Spinal Block Patient position: right lateral decubitus Prep: Betadine Patient monitoring: heart rate and blood pressure Approach: midline Location: L3-4 Injection technique: single-shot Needle Needle type: Tuohy  Needle gauge: 22 G Needle length: 9 cm Needle insertion depth: 5 cm Assessment Sensory level: T8  Procedure Name: Intubation Date/Time: 10/01/2015 1:05 PM Performed by: Ginger CarneMICHELET, Samir Ishaq Pre-anesthesia Checklist: Patient identified, Emergency Drugs available, Suction available, Patient being monitored and Timeout performed Patient Re-evaluated:Patient Re-evaluated prior to inductionOxygen Delivery Method: Circle system utilized Preoxygenation: Pre-oxygenation with 100% oxygen Intubation Type: IV induction Ventilation: Nasal airway inserted- appropriate to patient size and Mask ventilation with difficulty Laryngoscope Size: Miller and 2 Grade View: Grade I Tube type: Oral Tube size: 7.0 mm Number of attempts: 1 Airway Equipment and Method: Stylet Placement Confirmation: ETT inserted through vocal cords under direct vision,  positive ETCO2 and breath sounds checked- equal and bilateral Secured at: 21 cm Tube secured with: Tape Dental Injury: Teeth and Oropharynx as per pre-operative assessment

## 2015-10-01 NOTE — Care Management Note (Signed)
Case Management Note  Patient Details  Name: Daisy [name] MRN: [id number] Date of Birth: 05-18-1922  Subjective/Objective:       82yo [name] and fractured her right hip on 09/30/15. She was also diagnosed with a UTI. Hx: Dementia. [facility] Dr Martha ClanKrasinski has evaluated Daisy [name] and is planning surgery on 10/01/15. [facility] CSW is following for SNF placement.              Action/Plan:   Expected Discharge Date:                  Expected Discharge Plan:     In-House Referral:     Discharge planning Services     Post Acute Care Choice:    Choice offered to:     DME Arranged:    DME Agency:     HH Arranged:    HH Agency:     Status of Service:     Medicare Important Message Given:    Date Medicare IM Given:    Medicare IM give by:    Date Additional Medicare IM Given:    Additional Medicare Important Message give by:     If discussed at Long Length of Stay Meetings, dates discussed:    Additional Comments:  Laela Deviney A, RN 10/01/2015, 11:48 AM

## 2015-10-01 NOTE — Op Note (Signed)
DATE OF SURGERY:  10/01/2015  TIME: 2:54 PM  PATIENT NAME:  Daisy [name]  AGE: 82 y.o.  PRE-OPERATIVE DIAGNOSIS:  Right intertrochanteric hip fracture  POST-OPERATIVE DIAGNOSIS:  SAME  PROCEDURE:  INTRAMEDULLARY (IM) NAIL FEMORAL, RIGHT HIP  SURGEON:  Juanell Fairly  OPERATIVE IMPLANTS: Biomet short 125 degree Affixus nail 9x174mm , 90 mm lag screw with a 36 mm distal interlocking screw  PREOPERATIVE INDICATIONS:  Daisy [name] is a 82 y.o. year old who fell and suffered a hip fracture. She was brought into the ER and then admitted and medically cleared for surgical intervention.    The risks, benefits and alternatives were discussed with the patient and their family.  The risks include but are not limited to infection, bleeding, nerve or blood vessel injury, malunion, nonunion, hardware prominence, hardware failure, change in leg lengths or lower extremity rotation need for further surgery including hardware removal with conversion to a total hip arthroplasty. Medical risks include but are not limited to DVT and pulmonary embolism, myocardial infarction, stroke, pneumonia, respiratory failure and death. The patient and their family understood these risks and wished to proceed with surgery.  OPERATIVE PROCEDURE:  The patient was brought to the operating room and placed in the supine position on the fracture table. Spinal and general anesthesia was administered.  A closed reduction was performed under C-arm guidance.  The fracture reduction was confirmed on both AP and lateral views. A time out was performed to verify the patient's name, date of birth, medical record number, correct site of surgery correct procedure to be performed. The timeout was also used to verify the patient received antibiotics and all appropriate instruments, implants and radiographic studies were available in the room. Once all in attendance were in agreement, the case began. The patient was prepped and draped in  a sterile fashion. She received preoperative antibiotics.  An incision was made proximal to the greater trochanter in line with the femur. A guidewire was placed over the tip of the greater trochanter and advanced into the proximal femur to the level of the lesser trochanter.  Confirmation of the drill pin position was made on AP and lateral C-arm images.  The threaded guidepin was then overdrilled with the proximal femoral drill.  The nail was then inserted into the proximal femur, across the fracture site and into the femoral shaft. Its position was confirmed on AP and lateral C-arm images.   Once the nail was completely seated, the drill guide for the lag screw was placed through the guide arm for the Affixus nail. A guidepin was then placed through this drill guide and advanced through the lateral cortex of the femur, across the fracture site and into the femoral head achieving a tip apex distance of less than 25 mm. The length of the drill pin was measured, and then the drill for the lag screw was advanced through the lateral cortex, across the fracture site and up into the femoral head to the depth of the lag screw..  The lag screw was then advanced by hand into position across the fracture site into the femoral head. Its final position was confirmed on AP and lateral C-arm images. Compression was applied as traction was carefully released. The set screw in the top of the intramedullary rod was tightened by hand using a screwdriver. It was backed off a quarter turn to allow for compression at the fracture site.  The drill sleeve for the distal interlocking screw was then placed through  the Affixus guide arm. A small stab incision was made to allow the drill guide to approximate the lateral cortex of the femur. The drill for the distal interlocking screw was then advanced bicortically. The depth of this drill was measured. A distal interlocking screw with the length measured was then inserted by hand  through the guide arm. Final C-arm images of the entire intramedullary construct were taken in both the AP and lateral planes.   The wounds were irrigated copiously and closed with 0 Vicryl for closure of the deep fascia and 2-0 Vicryl for subcutaneous closure. The skin was approximated with staples. A dry sterile dressing was applied. I was scrubbed and present the entire case and all sharp and instrument counts were correct at the conclusion of the case. Patient was transferred to hospital bed and brought to PACU in stable condition. I spoke with the patient's family in the postop consultation room to let them know the case had gone without complication and the patient was stable in the recovery room.     Kathreen DevoidKevin L Clella Mckeel, MD

## 2015-10-01 NOTE — Progress Notes (Signed)
Subjective:  Postoperative check: Patient doing well. She is comfortable in her hospital room currently. Patient reports pain as mild.  She has no complaints. Her family is in the room. Patient is seen with her nurse, Verlon AuLeslie.  Objective:   VITALS:   Filed Vitals:   10/01/15 1051 10/01/15 1507 10/01/15 1515 10/01/15 1645  BP: 110/60 119/60 114/61 115/63  Pulse:  73  89  Temp:  98.9 F (37.2 C)  97.8 F (36.6 C)  TempSrc:  Tympanic  Oral  Resp:  14  16  Height:      Weight:      SpO2:  98%  94%    PHYSICAL EXAM:  Right hip and lower extremity: Patient's dressing is clean dry and intact. Her thigh compartments are soft and compressible. He is a chronic right knee flexion contracture of approximately 20 due to advanced osteoarthritis in her right knee. She has intact sensation to light touch, palpable pedal pulses and intact motor function. Patient can flex and extend her toes and dorsiflex and plantarflex her ankle.  LABS  Results for orders placed or performed during the hospital encounter of 09/30/15 (from the past 24 hour(s))  Surgical pcr screen     Status: Abnormal   Collection Time: 09/30/15  9:30 PM  Result Value Ref Range   MRSA, PCR POSITIVE (A) NEGATIVE   Staphylococcus aureus POSITIVE (A) NEGATIVE  CBC     Status: None   Collection Time: 10/01/15  4:21 AM  Result Value Ref Range   WBC 10.5 3.6 - 11.0 K/uL   RBC 4.09 3.80 - 5.20 MIL/uL   Hemoglobin 12.2 12.0 - 16.0 g/dL   HCT 40.937.1 81.135.0 - 91.447.0 %   MCV 90.8 80.0 - 100.0 fL   MCH 29.8 26.0 - 34.0 pg   MCHC 32.8 32.0 - 36.0 g/dL   RDW 78.214.4 95.611.5 - 21.314.5 %   Platelets 179 150 - 440 K/uL  Basic metabolic panel     Status: Abnormal   Collection Time: 10/01/15  4:21 AM  Result Value Ref Range   Sodium 141 135 - 145 mmol/L   Potassium 3.8 3.5 - 5.1 mmol/L   Chloride 102 101 - 111 mmol/L   CO2 32 22 - 32 mmol/L   Glucose, Bld 149 (H) 65 - 99 mg/dL   BUN 14 6 - 20 mg/dL   Creatinine, Ser 0.861.01 (H) 0.44 - 1.00 mg/dL    Calcium 8.7 (L) 8.9 - 10.3 mg/dL   GFR calc non Af Amer 47 (L) >60 mL/min   GFR calc Af Amer 55 (L) >60 mL/min   Anion gap 7 5 - 15  Hemoglobin A1c     Status: None   Collection Time: 10/01/15  4:21 AM  Result Value Ref Range   Hgb A1c MFr Bld 5.7 4.0 - 6.0 %    Dg Chest 1 View  09/30/2015  CLINICAL DATA:  Right hip fracture. Pre-op respiratory exam previous stroke and myocardial infarct. Has been hypertension. EXAM: CHEST 1 VIEW COMPARISON:  02/09/2014 and CT on 10/17/2012 FINDINGS: Mild to moderate cardiomegaly stable. Small to moderate hiatal hernia again seen. No evidence of mediastinal widening or tracheal deviation. No evidence of pneumothorax or hemothorax. Chronic pulmonary interstitial prominence noted. No evidence of acute infiltrate. IMPRESSION: Stable cardiomegaly, hiatal hernia, and chronic pulmonary interstitial prominence. No acute findings. Electronically Signed   By: Myles RosenthalJohn  Stahl M.D.   On: 09/30/2015 17:37   Ct Head Wo Contrast  09/30/2015  CLINICAL DATA:  82 year old female with history of trauma from a fall onto the ground earlier today. EXAM: CT HEAD WITHOUT CONTRAST CT CERVICAL SPINE WITHOUT CONTRAST TECHNIQUE: Multidetector CT imaging of the head and cervical spine was performed following the standard protocol without intravenous contrast. Multiplanar CT image reconstructions of the cervical spine were also generated. COMPARISON:  Head CT 08/21/2013. FINDINGS: CT HEAD FINDINGS Moderate cerebral atrophy. Patchy and confluent areas of decreased attenuation are noted throughout the deep and periventricular white matter of the cerebral hemispheres bilaterally, compatible with chronic microvascular ischemic disease. Old lacunar infarcts in the basal ganglia bilaterally and in the right thalamus again noted. No acute displaced skull fractures are identified. No acute intracranial abnormality. Specifically, no evidence of acute post-traumatic intracranial hemorrhage, no definite  regions of acute/subacute cerebral ischemia, no focal mass, mass effect, hydrocephalus or abnormal intra or extra-axial fluid collections. The visualized paranasal sinuses and mastoids are well pneumatized, with exception of small bilateral mastoid effusions which appear to be chronic (similar to prior). CT CERVICAL SPINE FINDINGS No acute displaced fractures of the cervical spine. Prevertebral soft tissue toes are distorted by the presence of retropharyngeal carotid arteries bilaterally, but are otherwise unremarkable in appearance. There is reversal of normal cervical lordosis centered at the level of C5, which appears to be chronic and is very similar to the prior examination. Likewise, 3 mm of anterolisthesis of C2 upon C3 and 4 mm of anterolisthesis of C4 upon C5 is also unchanged. Chronic fusion of C5-C6 is noted, with developing fusion between C6-C7 slightly increased compared to the prior study. There are multiple other levels of degenerative disc disease throughout the cervical spine, and there is severe multilevel facet arthropathy. Extensive atherosclerosis throughout the visualized vasculature. Visualized portions of the upper thorax are otherwise unremarkable in appearance. IMPRESSION: 1. No evidence of significant acute traumatic injury to the skull, brain or cervical spine. 2. Moderate cerebral atrophy with chronic microvascular ischemic changes in the cerebral white matter and old lacunar infarcts, similar to the prior study, as above. 3. Severe multilevel degenerative disc disease and cervical spondylosis redemonstrated, very similar to prior study 08/21/2013. 4. Small bilateral mastoid effusions appear to be chronic and are similar to the prior study. Electronically Signed   By: Trudie Reed M.D.   On: 09/30/2015 19:10   Ct Cervical Spine Wo Contrast  09/30/2015  CLINICAL DATA:  82 year old female with history of trauma from a fall onto the ground earlier today. EXAM: CT HEAD WITHOUT  CONTRAST CT CERVICAL SPINE WITHOUT CONTRAST TECHNIQUE: Multidetector CT imaging of the head and cervical spine was performed following the standard protocol without intravenous contrast. Multiplanar CT image reconstructions of the cervical spine were also generated. COMPARISON:  Head CT 08/21/2013. FINDINGS: CT HEAD FINDINGS Moderate cerebral atrophy. Patchy and confluent areas of decreased attenuation are noted throughout the deep and periventricular white matter of the cerebral hemispheres bilaterally, compatible with chronic microvascular ischemic disease. Old lacunar infarcts in the basal ganglia bilaterally and in the right thalamus again noted. No acute displaced skull fractures are identified. No acute intracranial abnormality. Specifically, no evidence of acute post-traumatic intracranial hemorrhage, no definite regions of acute/subacute cerebral ischemia, no focal mass, mass effect, hydrocephalus or abnormal intra or extra-axial fluid collections. The visualized paranasal sinuses and mastoids are well pneumatized, with exception of small bilateral mastoid effusions which appear to be chronic (similar to prior). CT CERVICAL SPINE FINDINGS No acute displaced fractures of the cervical spine. Prevertebral soft tissue toes are distorted by the presence of  retropharyngeal carotid arteries bilaterally, but are otherwise unremarkable in appearance. There is reversal of normal cervical lordosis centered at the level of C5, which appears to be chronic and is very similar to the prior examination. Likewise, 3 mm of anterolisthesis of C2 upon C3 and 4 mm of anterolisthesis of C4 upon C5 is also unchanged. Chronic fusion of C5-C6 is noted, with developing fusion between C6-C7 slightly increased compared to the prior study. There are multiple other levels of degenerative disc disease throughout the cervical spine, and there is severe multilevel facet arthropathy. Extensive atherosclerosis throughout the visualized  vasculature. Visualized portions of the upper thorax are otherwise unremarkable in appearance. IMPRESSION: 1. No evidence of significant acute traumatic injury to the skull, brain or cervical spine. 2. Moderate cerebral atrophy with chronic microvascular ischemic changes in the cerebral white matter and old lacunar infarcts, similar to the prior study, as above. 3. Severe multilevel degenerative disc disease and cervical spondylosis redemonstrated, very similar to prior study 08/21/2013. 4. Small bilateral mastoid effusions appear to be chronic and are similar to the prior study. Electronically Signed   By: Trudie Reed M.D.   On: 09/30/2015 19:10   Dg Hip Port Unilat With Pelvis 1v Right  10/01/2015  CLINICAL DATA:  Postop EXAM: DG HIP (WITH OR WITHOUT PELVIS) 1V PORT RIGHT COMPARISON:  09/30/2015 FINDINGS: Three views of the right hip submitted. The patient is status post intraoperative repair of right intertrochanteric fracture. There is intra medullary rod in proximal right femur. A dynamic locking screw is noted along the femoral neck. A fixation screw is noted in proximal shaft of the right femur. There is anatomic alignment. IMPRESSION: Status post intraoperative repair of intertrochanteric right femoral fracture. Metallic fixation material in proximal right femur with anatomic alignment. Electronically Signed   By: Natasha Mead M.D.   On: 10/01/2015 16:45   Dg Hip Operative Unilat W Or W/o Pelvis Right  10/01/2015  CLINICAL DATA:  OPERATIVE EXAM: OPERATIVE RIGHT HIP (WITH PELVIS IF PERFORMED) 4 VIEWS TECHNIQUE: Fluoroscopic spot image(s) were submitted for interpretation post-operatively. COMPARISON:  09/30/2015 FINDINGS: Status post placement of intra medullary rod with lag screw in the right hip. Femoral head appears normally located in the acetabulum. No new fractures are identified. IMPRESSION: Status post ORIF of the right hip. Electronically Signed   By: Norva Pavlov M.D.   On: 10/01/2015  15:54   Dg Hip Unilat With Pelvis 2-3 Views Right  09/30/2015  CLINICAL DATA:  Unwitnessed fall.  Right hip pain. EXAM: DG HIP (WITH OR WITHOUT PELVIS) 2-3V RIGHT COMPARISON:  None. FINDINGS: A comminuted intratrochanteric fracture is present with increased varus angulation. Pelvis is intact. Atherosclerotic disease is noted. IMPRESSION: Comminuted intertrochanteric fracture. Electronically Signed   By: Marin Roberts M.D.   On: 09/30/2015 17:38    Assessment/Plan: Day of Surgery   Principal Problem:   Closed right hip fracture [facility])  Patient doing well postop. She will continue on 24 hours of IV antibiotics. She is receiving Rocephin for UTI. The family states that she has chronic UTIs. Patient is also on vancomycin 24 hours postop due to a positive MRSA swab. Patient will have labs drawn in the morning. I reviewed her postoperative x-ray which demonstrates fractures well reduced and the hardware well positioned. She will begin physical and occupational therapy tomorrow.    Juanell Fairly , MD 10/01/2015, 5:13 PM

## 2015-10-02 DIAGNOSIS — S72001A Fracture of unspecified part of neck of right femur, initial encounter for closed fracture: Secondary | ICD-10-CM | POA: Diagnosis not present

## 2015-10-02 DIAGNOSIS — I161 Hypertensive emergency: Secondary | ICD-10-CM | POA: Diagnosis not present

## 2015-10-02 DIAGNOSIS — N390 Urinary tract infection, site not specified: Secondary | ICD-10-CM | POA: Diagnosis not present

## 2015-10-02 DIAGNOSIS — R739 Hyperglycemia, unspecified: Secondary | ICD-10-CM | POA: Diagnosis not present

## 2015-10-02 LAB — BASIC METABOLIC PANEL
ANION GAP: 8 (ref 5–15)
BUN: 17 mg/dL (ref 6–20)
CALCIUM: 8 mg/dL — AB (ref 8.9–10.3)
CO2: 30 mmol/L (ref 22–32)
Chloride: 101 mmol/L (ref 101–111)
Creatinine, Ser: 1.05 mg/dL — ABNORMAL HIGH (ref 0.44–1.00)
GFR, EST AFRICAN AMERICAN: 52 mL/min — AB (ref >60)
GFR, EST NON AFRICAN AMERICAN: 45 mL/min — AB (ref >60)
Glucose, Bld: 176 mg/dL — ABNORMAL HIGH (ref 65–99)
Potassium: 3.6 mmol/L (ref 3.5–5.1)
Sodium: 139 mmol/L (ref 135–145)

## 2015-10-02 LAB — CBC
HEMATOCRIT: 32.7 % — AB (ref 35.0–47.0)
Hemoglobin: 10.6 g/dL — ABNORMAL LOW (ref 12.0–16.0)
MCH: 29.6 pg (ref 26.0–34.0)
MCHC: 32.3 g/dL (ref 32.0–36.0)
MCV: 91.6 fL (ref 80.0–100.0)
PLATELETS: 149 10*3/uL — AB (ref 150–440)
RBC: 3.57 MIL/uL — ABNORMAL LOW (ref 3.80–5.20)
RDW: 14.7 % — AB (ref 11.5–14.5)
WBC: 11.6 10*3/uL — AB (ref 3.6–11.0)

## 2015-10-02 NOTE — Progress Notes (Signed)
[facility] [place] [facility]   PATIENT NAME: Daisy [name]    MR#:  [id number]  DATE OF BIRTH:  09-28-1921  SUBJECTIVE:  CHIEF COMPLAINT:   Chief Complaint  Patient presents with  . Fall  The patient is a 82 year old Caucasian female with past history significant for history of dementia, hyperlipidemia, and 9, hypertension, depression, stroke, heart attack, who presents to the hospital with complaints of right hip pain after fall. On arrival to emergency room, patient was noted to have comminuted intratrochanteric fracture, she was seen by orthopedist surgeon, who recommended intramedullary fixation of right hip fracture. Patient underwent operation on 10/01/2015, feels good today, denies any significant pain. Physical therapist evaluated her and recommended skilled nursing facility placement for rehabilitation. Review of Systems  Unable to perform ROS: dementia    VITAL SIGNS: Blood pressure 128/76, pulse 72, temperature 98.7 F (37.1 C), temperature source Oral, resp. rate 18, height 5\' 2"  (1.575 m), weight 85.957 kg (189 lb 8 oz), SpO2 87 %.  PHYSICAL EXAMINATION:   GENERAL:  82 y.o.-year-old patient lying in the bed in no significant distress, comfortable, smiling EYES: Pupils equal, round, reactive to light and accommodation. No scleral icterus. Extraocular muscles intact.  HEENT: Head atraumatic, normocephalic. Oropharynx and nasopharynx clear.  NECK:  Supple, no jugular venous distention. No thyroid enlargement, no tenderness.  LUNGS: Normal breath sounds bilaterally, no wheezing, rales,rhonchi or crepitation. No use of accessory muscles of respiration.  CARDIOVASCULAR: S1, S2 normal. No murmurs, rubs, or gallops.  ABDOMEN: Soft, nontender, nondistended. Bowel sounds present. No organomegaly or mass.  EXTREMITIES: No pedal edema, cyanosis, or clubbing. Right lower extremity is normal length, no rotation NEUROLOGIC: Cranial nerves II through XII  are intact. Muscle strength 5/5 in all extremities. Sensation intact. Gait not checked.  PSYCHIATRIC: The patient is alert and oriented x 2.  SKIN: No obvious rash, lesion, or ulcer.   ORDERS/RESULTS REVIEWED:   CBC  Recent Labs Lab 09/30/15 1629 10/01/15 0421 10/02/15 0337  WBC 9.1 10.5 11.6*  HGB 13.3 12.2 10.6*  HCT 40.3 37.1 32.7*  PLT 192 179 149*  MCV 90.7 90.8 91.6  MCH 29.8 29.8 29.6  MCHC 32.9 32.8 32.3  RDW 14.5 14.4 14.7*  LYMPHSABS 1.5  --   --   MONOABS 0.9  --   --   EOSABS 0.3  --   --   BASOSABS 0.1  --   --    ------------------------------------------------------------------------------------------------------------------  Chemistries   Recent Labs Lab 09/30/15 1629 10/01/15 0421 10/02/15 0337  NA 140 141 139  K 3.7 3.8 3.6  CL 100* 102 101  CO2 34* 32 30  GLUCOSE 115* 149* 176*  BUN 16 14 17   CREATININE 1.18* 1.01* 1.05*  CALCIUM 8.7* 8.7* 8.0*  AST 15  --   --   ALT 9*  --   --   ALKPHOS 69  --   --   BILITOT 0.4  --   --    ------------------------------------------------------------------------------------------------------------------ estimated creatinine clearance is 35.5 mL/min (by C-G formula based on Cr of 1.05). ------------------------------------------------------------------------------------------------------------------ No results for input(s): TSH, T4TOTAL, T3FREE, THYROIDAB in the last 72 hours.  Invalid input(s): FREET3  Cardiac Enzymes  Recent Labs Lab 09/30/15 1629  TROPONINI <0.03   ------------------------------------------------------------------------------------------------------------------ Invalid input(s): POCBNP ---------------------------------------------------------------------------------------------------------------  RADIOLOGY: Dg Chest 1 View  09/30/2015  CLINICAL DATA:  Right hip fracture. Pre-op respiratory exam previous stroke and myocardial infarct. Has been hypertension. EXAM: CHEST 1 VIEW  COMPARISON:  02/09/2014  and CT on 10/17/2012 FINDINGS: Mild to moderate cardiomegaly stable. Small to moderate hiatal hernia again seen. No evidence of mediastinal widening or tracheal deviation. No evidence of pneumothorax or hemothorax. Chronic pulmonary interstitial prominence noted. No evidence of acute infiltrate. IMPRESSION: Stable cardiomegaly, hiatal hernia, and chronic pulmonary interstitial prominence. No acute findings. Electronically Signed   By: Myles Rosenthal M.D.   On: 09/30/2015 17:37   Ct Head Wo Contrast  09/30/2015  CLINICAL DATA:  82 year old female with history of trauma from a fall onto the ground earlier today. EXAM: CT HEAD WITHOUT CONTRAST CT CERVICAL SPINE WITHOUT CONTRAST TECHNIQUE: Multidetector CT imaging of the head and cervical spine was performed following the standard protocol without intravenous contrast. Multiplanar CT image reconstructions of the cervical spine were also generated. COMPARISON:  Head CT 08/21/2013. FINDINGS: CT HEAD FINDINGS Moderate cerebral atrophy. Patchy and confluent areas of decreased attenuation are noted throughout the deep and periventricular white matter of the cerebral hemispheres bilaterally, compatible with chronic microvascular ischemic disease. Old lacunar infarcts in the basal ganglia bilaterally and in the right thalamus again noted. No acute displaced skull fractures are identified. No acute intracranial abnormality. Specifically, no evidence of acute post-traumatic intracranial hemorrhage, no definite regions of acute/subacute cerebral ischemia, no focal mass, mass effect, hydrocephalus or abnormal intra or extra-axial fluid collections. The visualized paranasal sinuses and mastoids are well pneumatized, with exception of small bilateral mastoid effusions which appear to be chronic (similar to prior). CT CERVICAL SPINE FINDINGS No acute displaced fractures of the cervical spine. Prevertebral soft tissue toes are distorted by the presence of  retropharyngeal carotid arteries bilaterally, but are otherwise unremarkable in appearance. There is reversal of normal cervical lordosis centered at the level of C5, which appears to be chronic and is very similar to the prior examination. Likewise, 3 mm of anterolisthesis of C2 upon C3 and 4 mm of anterolisthesis of C4 upon C5 is also unchanged. Chronic fusion of C5-C6 is noted, with developing fusion between C6-C7 slightly increased compared to the prior study. There are multiple other levels of degenerative disc disease throughout the cervical spine, and there is severe multilevel facet arthropathy. Extensive atherosclerosis throughout the visualized vasculature. Visualized portions of the upper thorax are otherwise unremarkable in appearance. IMPRESSION: 1. No evidence of significant acute traumatic injury to the skull, brain or cervical spine. 2. Moderate cerebral atrophy with chronic microvascular ischemic changes in the cerebral white matter and old lacunar infarcts, similar to the prior study, as above. 3. Severe multilevel degenerative disc disease and cervical spondylosis redemonstrated, very similar to prior study 08/21/2013. 4. Small bilateral mastoid effusions appear to be chronic and are similar to the prior study. Electronically Signed   By: Trudie Reed M.D.   On: 09/30/2015 19:10   Ct Cervical Spine Wo Contrast  09/30/2015  CLINICAL DATA:  82 year old female with history of trauma from a fall onto the ground earlier today. EXAM: CT HEAD WITHOUT CONTRAST CT CERVICAL SPINE WITHOUT CONTRAST TECHNIQUE: Multidetector CT imaging of the head and cervical spine was performed following the standard protocol without intravenous contrast. Multiplanar CT image reconstructions of the cervical spine were also generated. COMPARISON:  Head CT 08/21/2013. FINDINGS: CT HEAD FINDINGS Moderate cerebral atrophy. Patchy and confluent areas of decreased attenuation are noted throughout the deep and periventricular  white matter of the cerebral hemispheres bilaterally, compatible with chronic microvascular ischemic disease. Old lacunar infarcts in the basal ganglia bilaterally and in the right thalamus again noted. No acute displaced skull fractures  are identified. No acute intracranial abnormality. Specifically, no evidence of acute post-traumatic intracranial hemorrhage, no definite regions of acute/subacute cerebral ischemia, no focal mass, mass effect, hydrocephalus or abnormal intra or extra-axial fluid collections. The visualized paranasal sinuses and mastoids are well pneumatized, with exception of small bilateral mastoid effusions which appear to be chronic (similar to prior). CT CERVICAL SPINE FINDINGS No acute displaced fractures of the cervical spine. Prevertebral soft tissue toes are distorted by the presence of retropharyngeal carotid arteries bilaterally, but are otherwise unremarkable in appearance. There is reversal of normal cervical lordosis centered at the level of C5, which appears to be chronic and is very similar to the prior examination. Likewise, 3 mm of anterolisthesis of C2 upon C3 and 4 mm of anterolisthesis of C4 upon C5 is also unchanged. Chronic fusion of C5-C6 is noted, with developing fusion between C6-C7 slightly increased compared to the prior study. There are multiple other levels of degenerative disc disease throughout the cervical spine, and there is severe multilevel facet arthropathy. Extensive atherosclerosis throughout the visualized vasculature. Visualized portions of the upper thorax are otherwise unremarkable in appearance. IMPRESSION: 1. No evidence of significant acute traumatic injury to the skull, brain or cervical spine. 2. Moderate cerebral atrophy with chronic microvascular ischemic changes in the cerebral white matter and old lacunar infarcts, similar to the prior study, as above. 3. Severe multilevel degenerative disc disease and cervical spondylosis redemonstrated, very  similar to prior study 08/21/2013. 4. Small bilateral mastoid effusions appear to be chronic and are similar to the prior study. Electronically Signed   By: Trudie Reedaniel  Entrikin M.D.   On: 09/30/2015 19:10   Dg Hip Port Unilat With Pelvis 1v Right  10/01/2015  CLINICAL DATA:  Postop EXAM: DG HIP (WITH OR WITHOUT PELVIS) 1V PORT RIGHT COMPARISON:  09/30/2015 FINDINGS: Three views of the right hip submitted. The patient is status post intraoperative repair of right intertrochanteric fracture. There is intra medullary rod in proximal right femur. A dynamic locking screw is noted along the femoral neck. A fixation screw is noted in proximal shaft of the right femur. There is anatomic alignment. IMPRESSION: Status post intraoperative repair of intertrochanteric right femoral fracture. Metallic fixation material in proximal right femur with anatomic alignment. Electronically Signed   By: Natasha MeadLiviu  Pop M.D.   On: 10/01/2015 16:45   Dg Hip Operative Unilat W Or W/o Pelvis Right  10/01/2015  CLINICAL DATA:  OPERATIVE EXAM: OPERATIVE RIGHT HIP (WITH PELVIS IF PERFORMED) 4 VIEWS TECHNIQUE: Fluoroscopic spot image(s) were submitted for interpretation post-operatively. COMPARISON:  09/30/2015 FINDINGS: Status post placement of intra medullary rod with lag screw in the right hip. Femoral head appears normally located in the acetabulum. No new fractures are identified. IMPRESSION: Status post ORIF of the right hip. Electronically Signed   By: Norva PavlovElizabeth  Brown M.D.   On: 10/01/2015 15:54   Dg Hip Unilat With Pelvis 2-3 Views Right  09/30/2015  CLINICAL DATA:  Unwitnessed fall.  Right hip pain. EXAM: DG HIP (WITH OR WITHOUT PELVIS) 2-3V RIGHT COMPARISON:  None. FINDINGS: A comminuted intratrochanteric fracture is present with increased varus angulation. Pelvis is intact. Atherosclerotic disease is noted. IMPRESSION: Comminuted intertrochanteric fracture. Electronically Signed   By: Marin Robertshristopher  Mattern M.D.   On: 09/30/2015 17:38     EKG:  Orders placed or performed during the hospital encounter of 09/30/15  . ED EKG  . ED EKG    ASSESSMENT AND PLAN:  Principal Problem:   Closed right hip fracture (HCC) #1 comminuted  intratrochanteric right hip fracture, closed, status post intramedullary nail placement on 10/01/2015 by Dr. Martha Clan, continue pain medications, pain control is adequate. Physical therapist evaluated patient and recommended skilled nursing facility placement for rehabilitation. Continue supportive therapy. Discussed this patient's daughter extensively. All questions were answered. Patient's daughter voiced understanding. #2. Urinary tract infection due to gram-negative rods, awaiting for identification and sensitivities.Continue Rocephin intravenously for now #3. CK D stage III, stable with therapy, no intervention #4. Hyperglycemia, hemoglobin A1c was 5.7, no diabetes mellitus #5. MRSA carrier, continue therapy with Bactroban intranasally #6. Leukocytosis, likely due to operation, follow lab wise #7. Thrombocytopenia, likely consumption, follow labs   Management plans discussed with the patient, family and they are in agreement.   DRUG ALLERGIES:  Allergies  Allergen Reactions  . Aricept [Donepezil Hcl] Other (See Comments)    "has nightmares"  . Codeine     REACTION: Pt. unsure.  Marland Kitchen Ketek [Telithromycin] Other (See Comments)    "fearful state, not herself"    CODE STATUS:     Code Status Orders        Start     Ordered   09/30/15 1745  Full code   Continuous     09/30/15 1746    Code Status History    Date Active Date Inactive Code Status Order ID Comments User Context   06/05/2013  1:20 AM 06/09/2013  9:41 PM Full Code [id number]  Alison Murray, MD Inpatient    Advance Directive Documentation        Most Recent Value   Type of Advance Directive  Healthcare Power of Attorney, Living will   Pre-existing out of facility DNR order (yellow form or pink MOST form)     "MOST" Form  in Place?        TOTAL TIME TAKING CARE OF THIS PATIENT: 30 minutes.  Discussed with patient's daughter extensively, all questions answered  Yaakov Saindon M.D on 10/02/2015 at 12:52 PM  Between 7am to 6pm - Pager - [phone]  After 6pm go to [email or web address] - password [email or web address] [facility]  [facility]  Office  [phone]  CC: Primary care physician; BABAOFF, Lavada Mesi, MD

## 2015-10-02 NOTE — Clinical Social Work Placement (Signed)
   CLINICAL SOCIAL WORK PLACEMENT  NOTE  Date:  10/02/2015  Patient Details  Name: Daisy [name] MRN: [id number] Date of Birth: July 24, 1921  Clinical Social Work is seeking post-discharge placement for this patient at the Skilled  Nursing Facility level of care (*CSW will initial, date and re-position this form in  chart as items are completed):  Yes   Patient/family provided with [place] Clinical Social Work Department's list of facilities offering this level of care within the geographic area requested by the patient (or if unable, by the patient's family).  Yes   Patient/family informed of their freedom to choose among providers that offer the needed level of care, that participate in Medicare, Medicaid or managed care program needed by the patient, have an available bed and are willing to accept the patient.  Yes   Patient/family informed of [facility]'s ownership interest [facility] and [facility], as well as of the fact that they are under no obligation to receive care at these facilities.  PASRR submitted to EDS on       PASRR number received on       Existing PASRR number confirmed on 10/02/15     FL2 transmitted to all facilities in geographic area requested by pt/family on 10/02/15     FL2 transmitted to all facilities within larger geographic area on       Patient informed that his/her managed care company has contracts with or will negotiate with certain facilities, including the following:            Patient/family informed of bed offers received.  Patient chooses bed at       Physician recommends and patient chooses bed at      Patient to be transferred to   on  .  Patient to be transferred to facility by       Patient family notified on   of transfer.  Name of family member notified:        PHYSICIAN       Additional Comment:    _______________________________________________ Haig ProphetMorgan, Arron Tetrault G, LCSW 10/02/2015, 9:05 AM

## 2015-10-02 NOTE — Progress Notes (Signed)
Subjective:  Postop day #1 status post intramedullary fixation for right intertrochanteric hip fracture. Patient reports right hip pain is mild.  Patient is more confused today. She seems anxious to leave the hospital. Her son is at the bedside.  Objective:   VITALS:   Filed Vitals:   10/01/15 2300 10/02/15 0300 10/02/15 0743 10/02/15 0931  BP: 120/74 132/80 128/76   Pulse: 75 74 72   Temp: 99.2 F (37.3 C) 98.3 F (36.8 C) 98.7 F (37.1 C)   TempSrc: Oral Oral Oral   Resp: 18 18 18    Height:      Weight:      SpO2: 96% 95% 95% 87%    PHYSICAL EXAM:  Right lower extremity: Patient's dressing is clean dry and intact. She has mild thigh swelling but her thigh compartments are soft and compressible. She has intact sensation to light touch in the bilateral lower extremities and can flex and extend her toes and dorsiflex and plantarflex her ankles bilaterally. She has chronic bilateral lower extremity edema and no calf tenderness.  LABS  Results for orders placed or performed during the hospital encounter of 09/30/15 (from the past 24 hour(s))  CBC     Status: Abnormal   Collection Time: 10/02/15  3:37 AM  Result Value Ref Range   WBC 11.6 (H) 3.6 - 11.0 K/uL   RBC 3.57 (L) 3.80 - 5.20 MIL/uL   Hemoglobin 10.6 (L) 12.0 - 16.0 g/dL   HCT 45.4 (L) 09.8 - 11.9 %   MCV 91.6 80.0 - 100.0 fL   MCH 29.6 26.0 - 34.0 pg   MCHC 32.3 32.0 - 36.0 g/dL   RDW 14.7 (H) 82.9 - 56.2 %   Platelets 149 (L) 150 - 440 K/uL  Basic metabolic panel     Status: Abnormal   Collection Time: 10/02/15  3:37 AM  Result Value Ref Range   Sodium 139 135 - 145 mmol/L   Potassium 3.6 3.5 - 5.1 mmol/L   Chloride 101 101 - 111 mmol/L   CO2 30 22 - 32 mmol/L   Glucose, Bld 176 (H) 65 - 99 mg/dL   BUN 17 6 - 20 mg/dL   Creatinine, Ser 1.30 (H) 0.44 - 1.00 mg/dL   Calcium 8.0 (L) 8.9 - 10.3 mg/dL   GFR calc non Af Amer 45 (L) >60 mL/min   GFR calc Af Amer 52 (L) >60 mL/min   Anion gap 8 5 - 15    Dg  Chest 1 View  09/30/2015  CLINICAL DATA:  Right hip fracture. Pre-op respiratory exam previous stroke and myocardial infarct. Has been hypertension. EXAM: CHEST 1 VIEW COMPARISON:  02/09/2014 and CT on 10/17/2012 FINDINGS: Mild to moderate cardiomegaly stable. Small to moderate hiatal hernia again seen. No evidence of mediastinal widening or tracheal deviation. No evidence of pneumothorax or hemothorax. Chronic pulmonary interstitial prominence noted. No evidence of acute infiltrate. IMPRESSION: Stable cardiomegaly, hiatal hernia, and chronic pulmonary interstitial prominence. No acute findings. Electronically Signed   By: Myles Rosenthal M.D.   On: 09/30/2015 17:37   Ct Head Wo Contrast  09/30/2015  CLINICAL DATA:  82 year old female with history of trauma from a fall onto the ground earlier today. EXAM: CT HEAD WITHOUT CONTRAST CT CERVICAL SPINE WITHOUT CONTRAST TECHNIQUE: Multidetector CT imaging of the head and cervical spine was performed following the standard protocol without intravenous contrast. Multiplanar CT image reconstructions of the cervical spine were also generated. COMPARISON:  Head CT 08/21/2013. FINDINGS: CT HEAD FINDINGS  Moderate cerebral atrophy. Patchy and confluent areas of decreased attenuation are noted throughout the deep and periventricular white matter of the cerebral hemispheres bilaterally, compatible with chronic microvascular ischemic disease. Old lacunar infarcts in the basal ganglia bilaterally and in the right thalamus again noted. No acute displaced skull fractures are identified. No acute intracranial abnormality. Specifically, no evidence of acute post-traumatic intracranial hemorrhage, no definite regions of acute/subacute cerebral ischemia, no focal mass, mass effect, hydrocephalus or abnormal intra or extra-axial fluid collections. The visualized paranasal sinuses and mastoids are well pneumatized, with exception of small bilateral mastoid effusions which appear to be  chronic (similar to prior). CT CERVICAL SPINE FINDINGS No acute displaced fractures of the cervical spine. Prevertebral soft tissue toes are distorted by the presence of retropharyngeal carotid arteries bilaterally, but are otherwise unremarkable in appearance. There is reversal of normal cervical lordosis centered at the level of C5, which appears to be chronic and is very similar to the prior examination. Likewise, 3 mm of anterolisthesis of C2 upon C3 and 4 mm of anterolisthesis of C4 upon C5 is also unchanged. Chronic fusion of C5-C6 is noted, with developing fusion between C6-C7 slightly increased compared to the prior study. There are multiple other levels of degenerative disc disease throughout the cervical spine, and there is severe multilevel facet arthropathy. Extensive atherosclerosis throughout the visualized vasculature. Visualized portions of the upper thorax are otherwise unremarkable in appearance. IMPRESSION: 1. No evidence of significant acute traumatic injury to the skull, brain or cervical spine. 2. Moderate cerebral atrophy with chronic microvascular ischemic changes in the cerebral white matter and old lacunar infarcts, similar to the prior study, as above. 3. Severe multilevel degenerative disc disease and cervical spondylosis redemonstrated, very similar to prior study 08/21/2013. 4. Small bilateral mastoid effusions appear to be chronic and are similar to the prior study. Electronically Signed   By: Trudie Reed M.D.   On: 09/30/2015 19:10   Ct Cervical Spine Wo Contrast  09/30/2015  CLINICAL DATA:  82 year old female with history of trauma from a fall onto the ground earlier today. EXAM: CT HEAD WITHOUT CONTRAST CT CERVICAL SPINE WITHOUT CONTRAST TECHNIQUE: Multidetector CT imaging of the head and cervical spine was performed following the standard protocol without intravenous contrast. Multiplanar CT image reconstructions of the cervical spine were also generated. COMPARISON:  Head  CT 08/21/2013. FINDINGS: CT HEAD FINDINGS Moderate cerebral atrophy. Patchy and confluent areas of decreased attenuation are noted throughout the deep and periventricular white matter of the cerebral hemispheres bilaterally, compatible with chronic microvascular ischemic disease. Old lacunar infarcts in the basal ganglia bilaterally and in the right thalamus again noted. No acute displaced skull fractures are identified. No acute intracranial abnormality. Specifically, no evidence of acute post-traumatic intracranial hemorrhage, no definite regions of acute/subacute cerebral ischemia, no focal mass, mass effect, hydrocephalus or abnormal intra or extra-axial fluid collections. The visualized paranasal sinuses and mastoids are well pneumatized, with exception of small bilateral mastoid effusions which appear to be chronic (similar to prior). CT CERVICAL SPINE FINDINGS No acute displaced fractures of the cervical spine. Prevertebral soft tissue toes are distorted by the presence of retropharyngeal carotid arteries bilaterally, but are otherwise unremarkable in appearance. There is reversal of normal cervical lordosis centered at the level of C5, which appears to be chronic and is very similar to the prior examination. Likewise, 3 mm of anterolisthesis of C2 upon C3 and 4 mm of anterolisthesis of C4 upon C5 is also unchanged. Chronic fusion of C5-C6 is noted,  with developing fusion between C6-C7 slightly increased compared to the prior study. There are multiple other levels of degenerative disc disease throughout the cervical spine, and there is severe multilevel facet arthropathy. Extensive atherosclerosis throughout the visualized vasculature. Visualized portions of the upper thorax are otherwise unremarkable in appearance. IMPRESSION: 1. No evidence of significant acute traumatic injury to the skull, brain or cervical spine. 2. Moderate cerebral atrophy with chronic microvascular ischemic changes in the cerebral  white matter and old lacunar infarcts, similar to the prior study, as above. 3. Severe multilevel degenerative disc disease and cervical spondylosis redemonstrated, very similar to prior study 08/21/2013. 4. Small bilateral mastoid effusions appear to be chronic and are similar to the prior study. Electronically Signed   By: Trudie Reedaniel  Entrikin M.D.   On: 09/30/2015 19:10   Dg Hip Port Unilat With Pelvis 1v Right  10/01/2015  CLINICAL DATA:  Postop EXAM: DG HIP (WITH OR WITHOUT PELVIS) 1V PORT RIGHT COMPARISON:  09/30/2015 FINDINGS: Three views of the right hip submitted. The patient is status post intraoperative repair of right intertrochanteric fracture. There is intra medullary rod in proximal right femur. A dynamic locking screw is noted along the femoral neck. A fixation screw is noted in proximal shaft of the right femur. There is anatomic alignment. IMPRESSION: Status post intraoperative repair of intertrochanteric right femoral fracture. Metallic fixation material in proximal right femur with anatomic alignment. Electronically Signed   By: Natasha MeadLiviu  Pop M.D.   On: 10/01/2015 16:45   Dg Hip Operative Unilat W Or W/o Pelvis Right  10/01/2015  CLINICAL DATA:  OPERATIVE EXAM: OPERATIVE RIGHT HIP (WITH PELVIS IF PERFORMED) 4 VIEWS TECHNIQUE: Fluoroscopic spot image(s) were submitted for interpretation post-operatively. COMPARISON:  09/30/2015 FINDINGS: Status post placement of intra medullary rod with lag screw in the right hip. Femoral head appears normally located in the acetabulum. No new fractures are identified. IMPRESSION: Status post ORIF of the right hip. Electronically Signed   By: Norva PavlovElizabeth  Brown M.D.   On: 10/01/2015 15:54   Dg Hip Unilat With Pelvis 2-3 Views Right  09/30/2015  CLINICAL DATA:  Unwitnessed fall.  Right hip pain. EXAM: DG HIP (WITH OR WITHOUT PELVIS) 2-3V RIGHT COMPARISON:  None. FINDINGS: A comminuted intratrochanteric fracture is present with increased varus angulation. Pelvis is  intact. Atherosclerotic disease is noted. IMPRESSION: Comminuted intertrochanteric fracture. Electronically Signed   By: Marin Robertshristopher  Mattern M.D.   On: 09/30/2015 17:38    Assessment/Plan: 1 Day Post-Op   Principal Problem:   Closed right hip fracture (HCC)  Patient is stable postop. Her hemoglobin and hematocrit remained stable today. Her Foley catheter has been removed. She will complete 24 hours of postop antibiotics. She should continue to use her incentive spirometer while awake. Labs will be redrawn in the morning. Continue physical and occupational therapy. Patient's pain seems to be controlled on current pain regimen. Patient to begin Lovenox today.    Juanell FairlyKRASINSKI, Christy Friede , MD 10/02/2015, 1:06 PM

## 2015-10-02 NOTE — Progress Notes (Signed)
Foley d/c'd at 0600 with 150cc output. 

## 2015-10-02 NOTE — Progress Notes (Signed)
PT is recommending SNF. Clinical Education officer, museum (CSW) met with patient's sons Gwyndolyn Saxon and Marden Noble outside of patient's room. Sons are agreeable to SNF and prefer [employer]. Per sons patient has been to [facility] before over a year ago. CSW explained to sons that patient's insurance Health Team will require authorization for SNF. Sons verbalized their understanding. CSW will continue to follow and assist as needed.   Blima Rich, LCSW [phone]

## 2015-10-02 NOTE — Anesthesia Postprocedure Evaluation (Signed)
Anesthesia Post Note  Patient: Daisy [name]  Procedure(s) Performed: Procedure(s) (LRB): INTRAMEDULLARY (IM) NAIL FEMORAL (Right)  Patient location during evaluation: PACU Anesthesia Type: General Level of consciousness: awake Pain management: pain level controlled Vital Signs Assessment: post-procedure vital signs reviewed and stable Respiratory status: spontaneous breathing Postop Assessment: no headache Anesthetic complications: no    Last Vitals:  Filed Vitals:   10/02/15 0300 10/02/15 0743  BP: 132/80 128/76  Pulse: 74 72  Temp: 36.8 C 37.1 C  Resp: 18 18    Last Pain:  Filed Vitals:   10/02/15 0744  PainSc: Asleep                 VAN STAVEREN,Chanler Mendonca

## 2015-10-02 NOTE — Evaluation (Signed)
Physical Therapy Evaluation Patient Details Name: Daisy [name] MRN: [id number] DOB: 1921-12-30 Today's Date: 10/02/2015   History of Present Illness  Daisy [name] is a 82yo white female [name], found to have sustained a R hip Fracture. Pt presents on POD1 s/p ORIF. At baseline, pt requires min-modA for transfers, mod-maxA for bed mobility, and walks with CGA for limited community distances.Pt has early stage dementia, PMH: UTI, CVA, HTN, MI. [facility] daughter.     Clinical Impression  At evaluation, pt is received recumbent in bed upon entry, daughter and RN present. The pt is awake and agreeable to participate, although she is disoriented to the situation and with clear short term memory impairment with difficulty attending to task without constant verbal cues. No acute distress noted initially, but significant grimacing with high exertional activities, wherein pain appears to be more limiting to function than isolated weakness. The pt is alert and oriented to person only, pleasant, conversational, and following simple commands 50-75% of time. Daughter reports 1 other fall in the last 6 months, however pt demonstrates inability to a balance self in-stance (also like more related to pain), and has limited confidence and anxiety while seated EOB, using BUE for support. Pt grip strength is strong and symmetrical; global strength as screened during functional mobility and exercises in bed presents with mild to moderate impairment, mostly inhibited by pain, but with good postoperative activation.The pt now requires more physical assistance for bed mobility, transfers, and gait, compared to baseline. The patients falls risk is high as evident in the above. Pt received on and remaining on 2L O2 throughout evaluation, with noted desaturation to 87% when moved to room air at rest, whereas pt is not on O2 at baseline at home.    Patient presenting with impairment of strength, range of motion, balance, oxygen perfusion, and activity tolerance, limiting ability to perform ADL and mobility tasks at  baseline level of function. Patient will benefit from skilled intervention to address the above impairments and limitations, in order to restore to prior level of function, improve patient safety upon discharge, and to decrease falls risk.       Follow Up Recommendations SNF    Equipment Recommendations       Recommendations for Other Services       Precautions / Restrictions Precautions Precaution Comments: ISO-Contact Restrictions Weight Bearing Restrictions: Yes RLE Weight Bearing: Partial weight bearing      Mobility  Bed Mobility Overal bed mobility: Needs Assistance Bed Mobility: Supine to Sit;Sit to Supine     Supine to sit: +2 for physical assistance;Total assist Sit to supine: +2 for physical assistance;Total assist   General bed mobility comments: Bridging intiation helps with lateral sliding in bed, but is very limited.   Transfers Overall transfer level: Needs assistance Equipment used: 1 person hand held assist Transfers: Sit to/from Stand Sit to Stand: Mod assist         General transfer comment: Fairly anxious, requires cues for hand placement, stands for ~3 minutes while trying to take steps (unsuccessful)  Ambulation/Gait Ambulation/Gait assistance:  (gait trial attempted, but unsuccessful; req Total assistance for support, WB, and movement of RLE for stepping. Pt unable to step with non-operative side when cued and assisted. )              Stairs            Wheelchair  Mobility    Modified Rankin (Stroke Patients Only)       Balance Overall balance assessment: Needs assistance   Sitting balance-[name] Scale: Poor       Standing balance-[name] Scale: Zero                               Pertinent Vitals/Pain Pain Assessment: Faces (Difficulty  rating pain due, likely due to dementia; no pain at rest, but pain is arresting during mobility attempts and HEP) Faces Pain Scale: Hurts whole lot Pain Location: Unable to determine which leg hurts, or if both legs hurt. Pain Descriptors / Indicators: Operative site guarding Pain Intervention(s): Limited activity within patient's tolerance;Monitored during session;Premedicated before session;Relaxation;Ice applied;Repositioned    Home Living Family/patient expects to be discharged to:: Skilled nursing facility                      Prior Function Level of Independence: Needs assistance   Gait / Transfers Assistance Needed: Supervisiopn-CGA for AMB, Xfers, BedMob req mod-maxA   ADL's / Homemaking Assistance Needed: Assistance with ADL.         Hand Dominance   Dominant Hand: Right    Extremity/Trunk Assessment   Upper Extremity Assessment: Generalized weakness           Lower Extremity Assessment: Generalized weakness         Communication   Communication: No difficulties  Cognition Arousal/Alertness: Awake/alert Behavior During Therapy: WFL for tasks assessed/performed;Anxious Overall Cognitive Status: History of cognitive impairments - at baseline       Memory: Decreased recall of precautions              General Comments      Exercises Other Exercises Other Exercises: RLE Heel Slides, c modA x15- Range is very limited (~25-30* hip flexion) Other Exercises: RLE SAQ, c modA x15 (chronic R knee flexion contracture at baseline per ortho note)  Other Exercises: Ankle pumps x20 bilat Other Exercises: Abduction/adduction heel slides x15, MaxA, (range limited to ~20*)- Difficulty determining whether this is limited by pain, receptive language barries/confusion), or fear avoidance behaviors.       Assessment/Plan    PT Assessment Patient needs continued PT services  PT Diagnosis Difficulty walking;Generalized weakness;Abnormality of gait;Acute pain    PT Problem List Decreased strength;Decreased range of motion;Decreased activity tolerance;Decreased balance;Decreased mobility;Decreased cognition;Decreased knowledge of precautions;Pain  PT Treatment Interventions DME instruction;Gait training;Functional mobility training;Therapeutic activities;Therapeutic exercise;Balance training;Stair training;Patient/family education   PT Goals (Current goals can be found in the Care Plan section) Acute Rehab PT Goals Patient Stated Goal: Pt is motivated to be able to walk again with ease.  PT Goal Formulation: With patient/family Time For Goal Achievement: 10/16/15 Potential to Achieve Goals: Fair    Frequency BID   Barriers to discharge Inaccessible home environment;Decreased caregiver support Will require a higher level of care than what [facility] is able to offer.     Co-evaluation               End of Session Equipment Utilized During Treatment: Gait belt Activity Tolerance: Patient limited by fatigue;Patient limited by pain;Other (comment) (fairly anxious but remains participatory. ) Patient left: in bed;with family/visitor present;with call bell/phone within reach;with bed alarm set Nurse Communication: Mobility status;Other (comment)         Time: 1610-9604 PT Time Calculation (min) (ACUTE ONLY): 42 min   Charges:   PT Evaluation $PT Eval  High Complexity: 1 Procedure PT Treatments $Therapeutic Exercise: 8-22 mins $Therapeutic Activity: 8-22 mins   PT G Codes:        9:49 AM, 10/28/2015 Rosamaria Lints, PT, DPT PRN Physical Therapist - [facility] License # [id number] [phone] (mobile)

## 2015-10-03 ENCOUNTER — Encounter: Payer: Self-pay | Admitting: Orthopedic Surgery

## 2015-10-03 DIAGNOSIS — I161 Hypertensive emergency: Secondary | ICD-10-CM | POA: Diagnosis not present

## 2015-10-03 DIAGNOSIS — N390 Urinary tract infection, site not specified: Secondary | ICD-10-CM | POA: Diagnosis not present

## 2015-10-03 DIAGNOSIS — R739 Hyperglycemia, unspecified: Secondary | ICD-10-CM | POA: Diagnosis not present

## 2015-10-03 DIAGNOSIS — S72001A Fracture of unspecified part of neck of right femur, initial encounter for closed fracture: Secondary | ICD-10-CM | POA: Diagnosis not present

## 2015-10-03 LAB — BASIC METABOLIC PANEL
Anion gap: 5 (ref 5–15)
BUN: 22 mg/dL — ABNORMAL HIGH (ref 6–20)
CHLORIDE: 100 mmol/L — AB (ref 101–111)
CO2: 32 mmol/L (ref 22–32)
CREATININE: 1.23 mg/dL — AB (ref 0.44–1.00)
Calcium: 8.1 mg/dL — ABNORMAL LOW (ref 8.9–10.3)
GFR calc non Af Amer: 37 mL/min — ABNORMAL LOW (ref >60)
GFR, EST AFRICAN AMERICAN: 43 mL/min — AB (ref >60)
Glucose, Bld: 126 mg/dL — ABNORMAL HIGH (ref 65–99)
Potassium: 3.8 mmol/L (ref 3.5–5.1)
Sodium: 137 mmol/L (ref 135–145)

## 2015-10-03 LAB — CBC
HEMATOCRIT: 29.6 % — AB (ref 35.0–47.0)
HEMOGLOBIN: 9.6 g/dL — AB (ref 12.0–16.0)
MCH: 30.1 pg (ref 26.0–34.0)
MCHC: 32.6 g/dL (ref 32.0–36.0)
MCV: 92.3 fL (ref 80.0–100.0)
Platelets: 138 10*3/uL — ABNORMAL LOW (ref 150–440)
RBC: 3.2 MIL/uL — ABNORMAL LOW (ref 3.80–5.20)
RDW: 14.4 % (ref 11.5–14.5)
WBC: 10.3 10*3/uL (ref 3.6–11.0)

## 2015-10-03 LAB — URINE CULTURE
Culture: >=100000 — AB
Special Requests: NORMAL

## 2015-10-03 MED ORDER — CARVEDILOL 6.25 MG PO TABS
6.2500 mg | ORAL_TABLET | Freq: Two times a day (BID) | ORAL | Status: DC
  Administered 2015-10-03 – 2015-10-04 (×2): 6.25 mg via ORAL
  Filled 2015-10-03 (×2): qty 1

## 2015-10-03 MED ORDER — CEPHALEXIN 250 MG/5ML PO SUSR
250.0000 mg | Freq: Three times a day (TID) | ORAL | Status: DC
  Administered 2015-10-03 – 2015-10-04 (×4): 250 mg via ORAL
  Filled 2015-10-03 (×9): qty 5

## 2015-10-03 MED ORDER — MAGNESIUM HYDROXIDE 400 MG/5ML PO SUSP
30.0000 mL | Freq: Every day | ORAL | Status: DC | PRN
  Administered 2015-10-03: 30 mL via ORAL
  Filled 2015-10-03: qty 30

## 2015-10-03 MED ORDER — OXYCODONE HCL 5 MG PO TABS
5.0000 mg | ORAL_TABLET | ORAL | Status: DC | PRN
Start: 2015-10-03 — End: 2015-10-04
  Administered 2015-10-03: 5 mg via ORAL
  Filled 2015-10-03: qty 1

## 2015-10-03 NOTE — Care Management Important Message (Signed)
Important Message  Patient Details  Name: Daisy [name] MRN: [id number] Date of Birth: 1922-02-11   Medicare Important Message Given:  Yes    Collie Siadngela Brigid Vandekamp, RN 10/03/2015, 10:55 AM

## 2015-10-03 NOTE — Addendum Note (Signed)
Addendum  created 10/03/15 1231 by Ginger CarneStephanie Sloan Galentine, CRNA   Modules edited: Clinical Notes   Clinical Notes:  File: [id number]

## 2015-10-03 NOTE — Discharge Instructions (Signed)
Patient will be partial weightbearing on the right lower extremity until her follow-up in the office in 2 weeks. Patient should continue to receive physical therapy for right hip and lower extremity range of motion, strengthening and gait training. She should elevate the right lower extremity whenever possible. She may apply ice to the surgical site to help reduce swelling. Dressing should be changed when necessary basis if drainage is observed. Continue DVT prophylaxis for 4-6 weeks postop. Patient will follow with orthopedic surgeon in his office in 10-14 days. Staples will be removed in the orthopedic office.  [facility], Dr. Emilianna Barlowe, [phone] °

## 2015-10-03 NOTE — Progress Notes (Addendum)
TC to Dr. Zorita PangVaikute r/t BP, and BM. New orders of 6.25 carvedilol and MOM.

## 2015-10-03 NOTE — Progress Notes (Signed)
[facility] [place] [facility]   PATIENT NAME: Daisy [name]    MR#:  [id number]  DATE OF BIRTH:  02-18-1922  SUBJECTIVE:  CHIEF COMPLAINT:   Chief Complaint  Patient presents with  . Fall  The patient is a 82 year old Caucasian female with past history significant for history of dementia, hyperlipidemia, and 9, hypertension, depression, stroke, heart attack, who presents to the hospital with complaints of right hip pain after fall. On arrival to emergency room, patient was noted to have comminuted intratrochanteric fracture, she was seen by orthopedist surgeon, who recommended intramedullary fixation of right hip fracture. Patient underwent operation on 10/01/2015, feels good today, denies any significant pain. Physical therapist evaluated her and recommended skilled nursing facility placement for rehabilitation. More somnolent today and not able to provide review of systems.  Review of Systems  Unable to perform ROS: dementia    VITAL SIGNS: Blood pressure 118/66, pulse 81, temperature 97.4 F (36.3 C), temperature source Axillary, resp. rate 16, height  (1.575 m), weight 85.957 kg (189 lb 8 oz), SpO2 96 %.  PHYSICAL EXAMINATION:   GENERAL:  82 y.o.-year-old patient lying in the bed in no significant distress, somnolent, grimacing EYES: Pupils equal, round, reactive to light and accommodation. No scleral icterus. Extraocular muscles intact.  HEENT: Head atraumatic, normocephalic. Oropharynx and nasopharynx clear.  NECK:  Supple, no jugular venous distention. No thyroid enlargement, no tenderness.  LUNGS: Normal breath sounds bilaterally, no wheezing, rales,rhonchi or crepitation. No use of accessory muscles of respiration.  CARDIOVASCULAR: S1, S2 normal. No murmurs, rubs, or gallops.  ABDOMEN: Soft, nontender, nondistended. Bowel sounds present. No organomegaly or mass.  EXTREMITIES: No pedal edema, cyanosis, or clubbing. Right lower extremity is  normal length, no rotation NEUROLOGIC: Cranial nerves II through XII are intact. Muscle strength 5/5 in all extremities. Sensation intact. Gait not checked.  PSYCHIATRIC: The patient is somnolent, not able to assess orientation, able to follow, however, some simple commands  SKIN: No obvious rash, lesion, or ulcer.   ORDERS/RESULTS REVIEWED:   CBC  Recent Labs Lab 09/30/15 1629 10/01/15 0421 10/02/15 0337 10/03/15 0302  WBC 9.1 10.5 11.6* 10.3  HGB 13.3 12.2 10.6* 9.6*  HCT 40.3 37.1 32.7* 29.6*  PLT 192 179 149* 138*  MCV 90.7 90.8 91.6 92.3  MCH 29.8 29.8 29.6 30.1  MCHC 32.9 32.8 32.3 32.6  RDW 14.5 14.4 14.7* 14.4  LYMPHSABS 1.5  --   --   --   MONOABS 0.9  --   --   --   EOSABS 0.3  --   --   --   BASOSABS 0.1  --   --   --    ------------------------------------------------------------------------------------------------------------------  Chemistries   Recent Labs Lab 09/30/15 1629 10/01/15 0421 10/02/15 0337 10/03/15 0302  NA 140 141 139 137  K 3.7 3.8 3.6 3.8  CL 100* 102 101 100*  CO2 34* 32 30 32  GLUCOSE 115* 149* 176* 126*  BUN 22*  CREATININE 1.18* 1.01* 1.05* 1.23*  CALCIUM 8.7* 8.7* 8.0* 8.1*  AST 15  --   --   --   ALT 9*  --   --   --   ALKPHOS 69  --   --   --   BILITOT 0.4  --   --   --    ------------------------------------------------------------------------------------------------------------------ estimated creatinine clearance is 30.3 mL/min (by C-G formula based on Cr of 1.23). ------------------------------------------------------------------------------------------------------------------ No results for input(s): TSH, T4TOTAL,  T3FREE, THYROIDAB in the last 72 hours.  Invalid input(s): FREET3  Cardiac Enzymes  Recent Labs Lab 09/30/15 1629  TROPONINI <0.03   ------------------------------------------------------------------------------------------------------------------ Invalid input(s):  POCBNP ---------------------------------------------------------------------------------------------------------------  RADIOLOGY: Dg Hip Port Unilat With Pelvis 1v Right  10/01/2015  CLINICAL DATA:  Postop EXAM: DG HIP (WITH OR WITHOUT PELVIS) 1V PORT RIGHT COMPARISON:  09/30/2015 FINDINGS: Three views of the right hip submitted. The patient is status post intraoperative repair of right intertrochanteric fracture. There is intra medullary rod in proximal right femur. A dynamic locking screw is noted along the femoral neck. A fixation screw is noted in proximal shaft of the right femur. There is anatomic alignment. IMPRESSION: Status post intraoperative repair of intertrochanteric right femoral fracture. Metallic fixation material in proximal right femur with anatomic alignment. Electronically Signed   By: Natasha Mead M.D.   On: 10/01/2015 16:45   Dg Hip Operative Unilat W Or W/o Pelvis Right  10/01/2015  CLINICAL DATA:  OPERATIVE EXAM: OPERATIVE RIGHT HIP (WITH PELVIS IF PERFORMED) 4 VIEWS TECHNIQUE: Fluoroscopic spot image(s) were submitted for interpretation post-operatively. COMPARISON:  09/30/2015 FINDINGS: Status post placement of intra medullary rod with lag screw in the right hip. Femoral head appears normally located in the acetabulum. No new fractures are identified. IMPRESSION: Status post ORIF of the right hip. Electronically Signed   By: Norva Pavlov M.D.   On: 10/01/2015 15:54    EKG:  Orders placed or performed during the hospital encounter of 09/30/15  . ED EKG  . ED EKG    ASSESSMENT AND PLAN:  Principal Problem:   Closed right hip fracture (HCC) #1 comminuted intratrochanteric right hip fracture, closed, status post intramedullary nail placement on 10/01/2015 by Dr. Martha Clan, continue pain medications as needed, pain control is adequate. Physical therapist evaluated patient and recommended skilled nursing facility placement for rehabilitation. Continue supportive therapy.  Discussed this patient's daughter today again. All questions were answered. Patient's daughter voiced understanding. #2. Urinary tract infection due to Escherichia coli, Escherichia coli sensitive to Rocephin, change antibiotic to Keflex, continue for a few more days to complete course #3. CK D stage III, stable with therapy, no intervention #4. Hyperglycemia, hemoglobin A1c was 5.7, no diabetes mellitus #5. MRSA carrier, continue therapy with Bactroban intranasally #6. Leukocytosis, due to operation, stress, resolved #7. Thrombocytopenia, likely consumption, follow labs as outpatient   Management plans discussed with the patient, family and they are in agreement.   DRUG ALLERGIES:  Allergies  Allergen Reactions  . Aricept [Donepezil Hcl] Other (See Comments)    "has nightmares"  . Codeine     REACTION: Pt. unsure.  Marland Kitchen Ketek [Telithromycin] Other (See Comments)    "fearful state, not herself"    CODE STATUS:     Code Status Orders        Start     Ordered   09/30/15 1745  Full code   Continuous     09/30/15 1746    Code Status History    Date Active Date Inactive Code Status Order ID Comments User Context   06/05/2013  1:20 AM 06/09/2013  9:41 PM Full Code [id number]  Alison Murray, MD Inpatient    Advance Directive Documentation        Most Recent Value   Type of Advance Directive  Healthcare Power of Attorney, Living will   Pre-existing out of facility DNR order (yellow form or pink MOST form)     "MOST" Form in Place?  TOTAL TIME TAKING CARE OF THIS PATIENT: 30 minutes.  Discussed with patient's daughter today again, all questions answered  Kohana Amble M.D on 10/03/2015 at 11:58 AM  Between 7am to 6pm - Pager - [phone]  After 6pm go to [email or web address] - password [email or web address] [facility]  [facility]  Office  [phone]  CC: Primary care physician; BABAOFF, Lavada MesiMARC E, MD

## 2015-10-03 NOTE — Progress Notes (Signed)
Subjective:  POD #2 status post intramedullary fixation for right intertrochanteric hip fracture. Patient is up out of bed to a chair. Her daughter is at the bedside. Patient reports pain as mild.  She had mild nausea today but currently does not complain of nausea.  She is pleasant but mildly confused.  Objective:   VITALS:   Filed Vitals:   10/02/15 1452 10/02/15 1945 10/03/15 0300 10/03/15 0800  BP: 118/58 122/64 137/69 118/66  Pulse: 85 88 80 81  Temp: 99.1 F (37.3 C) 98.9 F (37.2 C) 98 F (36.7 C) 97.4 F (36.3 C)  TempSrc: Oral Oral Oral Axillary  Resp: 17 18 18 16   Height:      Weight:      SpO2: 97% 98% 95% 96%    PHYSICAL EXAM:  Right lower extremity: Patient's right hip dressing remains clean dry and intact. She has mild swelling in the right thigh but her compartments remain soft and compressible. She has no calf tenderness or lower leg edema. She has palpable pedal pulses, intact sensation light touch intact motor function throughout the right lower extremity.  LABS  Results for orders placed or performed during the hospital encounter of 09/30/15 (from the past 24 hour(s))  CBC     Status: Abnormal   Collection Time: 10/03/15  3:02 AM  Result Value Ref Range   WBC 10.3 3.6 - 11.0 K/uL   RBC 3.20 (L) 3.80 - 5.20 MIL/uL   Hemoglobin 9.6 (L) 12.0 - 16.0 g/dL   HCT 56.229.6 (L) 13.035.0 - 86.547.0 %   MCV 92.3 80.0 - 100.0 fL   MCH 30.1 26.0 - 34.0 pg   MCHC 32.6 32.0 - 36.0 g/dL   RDW 78.414.4 69.611.5 - 29.514.5 %   Platelets 138 (L) 150 - 440 K/uL  Basic metabolic panel     Status: Abnormal   Collection Time: 10/03/15  3:02 AM  Result Value Ref Range   Sodium 137 135 - 145 mmol/L   Potassium 3.8 3.5 - 5.1 mmol/L   Chloride 100 (L) 101 - 111 mmol/L   CO2 32 22 - 32 mmol/L   Glucose, Bld 126 (H) 65 - 99 mg/dL   BUN 22 (H) 6 - 20 mg/dL   Creatinine, Ser 2.841.23 (H) 0.44 - 1.00 mg/dL   Calcium 8.1 (L) 8.9 - 10.3 mg/dL   GFR calc non Af Amer 37 (L) >60 mL/min   GFR calc Af Amer  43 (L) >60 mL/min   Anion gap 5 5 - 15    Dg Hip Port Unilat With Pelvis 1v Right  10/01/2015  CLINICAL DATA:  Postop EXAM: DG HIP (WITH OR WITHOUT PELVIS) 1V PORT RIGHT COMPARISON:  09/30/2015 FINDINGS: Three views of the right hip submitted. The patient is status post intraoperative repair of right intertrochanteric fracture. There is intra medullary rod in proximal right femur. A dynamic locking screw is noted along the femoral neck. A fixation screw is noted in proximal shaft of the right femur. There is anatomic alignment. IMPRESSION: Status post intraoperative repair of intertrochanteric right femoral fracture. Metallic fixation material in proximal right femur with anatomic alignment. Electronically Signed   By: Natasha MeadLiviu  Pop M.D.   On: 10/01/2015 16:45   Dg Hip Operative Unilat W Or W/o Pelvis Right  10/01/2015  CLINICAL DATA:  OPERATIVE EXAM: OPERATIVE RIGHT HIP (WITH PELVIS IF PERFORMED) 4 VIEWS TECHNIQUE: Fluoroscopic spot image(s) were submitted for interpretation post-operatively. COMPARISON:  09/30/2015 FINDINGS: Status post placement of intra medullary rod with  lag screw in the right hip. Femoral head appears normally located in the acetabulum. No new fractures are identified. IMPRESSION: Status post ORIF of the right hip. Electronically Signed   By: Norva Pavlov M.D.   On: 10/01/2015 15:54    Assessment/Plan: 2 Days Post-Op   Principal Problem:   Closed right hip fracture [facility])  Patient is doing well postop. Continue physical and occupational therapy. Plan is for patient to be discharged to her skilled nursing facility tomorrow. She'll remain partial weightbearing on the right lower extremity until follow-up in the office in 10-14 days. She continue TED stockings and Lovenox for DVT prophylaxis.    Juanell Fairly , MD 10/03/2015, 12:36 PM

## 2015-10-03 NOTE — Progress Notes (Addendum)
Clinical Education officer, museum (CSW) met with patient's daughter and presented bed offers. Daughter chose [facility] however she is going to [facility]. Per daughter she will call CSW with SNF choice. CSW contacted [place] [employer] case manager and made her aware of above. CSW will continue to follow and assist as needed.   Daughter's final SNF choice is [facility]. Plan is for patient to D/C to [facility] tomorrow 10/04/15 pending medical clearance.   Blima Rich, LCSW [phone]

## 2015-10-03 NOTE — Progress Notes (Signed)
Physical Therapy Treatment Patient Details Name: Daisy [name] MRN: [id number] DOB: 09-Feb-1922 Today's Date: 10/03/2015    History of Present Illness Daisy [name] is a 82yo white female who comes to [facility] p retropulsion at [facility], found to have sustained a R hip Fracture. Pt presents on POD1 s/p ORIF. At baseline, pt requires min-modA for transfers, mod-maxA for bed mobility, and walks with CGA for limited community distances.Pt has early stage dementia, PMH: UTI, CVA, HTN, MI. Pt has lived at [facility] for about 1 year per daughter.     PT Comments    Pt assisted in return to bed this pm.  Attempted transfer with walker but she was unable to turn feet even with 3rd assist to help position.  She was assisted back to recliner and hand held transfer was performed with max +2 and +1 as standby for safety.  Max x 2 to transition to supine and for rolling left and right to reposition pad.  Pt fell quickly asleep once positioned.  Family in for transfer and questions answered.   Follow Up Recommendations  SNF     Equipment Recommendations       Recommendations for Other Services       Precautions / Restrictions Precautions Precaution Comments: ISO-Contact Restrictions Weight Bearing Restrictions: Yes RLE Weight Bearing: Partial weight bearing    Mobility  Bed Mobility Overal bed mobility: Needs Assistance Bed Mobility: Sit to Supine     Supine to sit: +2 for physical assistance (+ 3 for safety) Sit to supine: +2 for physical assistance;Total assist      Transfers Overall transfer level: Needs assistance Equipment used: 2 person hand held assist (+ 3rd for safety) Transfers: Sit to/from Stand Sit to Stand: +2 safety/equipment (+3rd for safety)         General transfer comment: difficulty standing this pm, poor transfet quality  Ambulation/Gait                 Stairs            Wheelchair Mobility    Modified Rankin (Stroke Patients Only)        Balance Overall balance assessment: Needs assistance Sitting-balance support: Feet supported Sitting balance-[name] Scale: Poor     Standing balance support: Bilateral upper extremity supported Standing balance-[name] Scale: Zero                      Cognition Arousal/Alertness: Lethargic Behavior During Therapy: WFL for tasks assessed/performed Overall Cognitive Status: History of cognitive impairments - at baseline       Memory: Decreased recall of precautions              Exercises      General Comments        Pertinent Vitals/Pain Pain Assessment: 0-10 Pain Score: 5  Pain Location: R hip Pain Descriptors / Indicators: Aching Pain Intervention(s): Limited activity within patient's tolerance    Home Living                      Prior Function            PT Goals (current goals can now be found in the care plan section)      Frequency  BID    PT Plan Current plan remains appropriate    Co-evaluation             End of Session Equipment Utilized During Treatment: Gait belt Activity Tolerance: Patient  limited by fatigue;Patient limited by pain;Other (comment) Patient left: in bed;with family/visitor present;with call bell/phone within reach;with bed alarm set     Time: 1436-1450 PT Time Calculation (min) (ACUTE ONLY): 14 min  Charges:  $Therapeutic Activity: 8-22 mins                    G Codes:      Danielle DessSarah Lachlan Pelto, PTA 10/03/2015, 3:22 PM

## 2015-10-03 NOTE — Evaluation (Signed)
Occupational Therapy Evaluation Patient Details Name: Franki CabotMolene T Corpus MRN: [id number] DOB: [birth year] Today's Date: 10/03/2015    History of Present Illness Janey Cedric FishmanSharpe is a 82yo white female who [facility], found to have sustained a R hip Fracture. Pt presents on POD1 s/p ORIF. At baseline, pt requires min-modA for transfers, mod-maxA for bed mobility, and walks with CGA for limited community distances.Pt has early stage dementia, PMH: UTI, CVA, HTN, MI. [facility] daughter.    Clinical Impression   Pt. Is a 82 y.o. female who [facility] for [facility] repair of a Right Hip Fracture. Pt. presents with lethargy, pain, decreased strength, and limited activity tolerance which hinder her ability to complete basic ADL tasks. Pt. could benefit from skilled OT services for ADL retraining, cognitive training, and functional mobility during ADLs to improve basic ADL functioning. Pt. Family plans for pt. to go to SNF level of care upon discharge.     Follow Up Recommendations  SNF    Equipment Recommendations       Recommendations for Other Services PT consult     Precautions / Restrictions Precautions Precaution Comments: ISO-Contact Restrictions Weight Bearing Restrictions: Yes RLE Weight Bearing: Partial weight bearing      Mobility Bed Mobility                  Transfers                      Balance                                            ADL Overall ADL's : Needs assistance/impaired Eating/Feeding: Total assistance (Pt. not initiating hand to patterns for self-feeding, drinking. Pt. daughter ed was provided about self- feeding, positioning.)                                     General ADL Comments: Pt. is total assist with UE and LE ADLs secondary to lethargy and cognition.     Vision     Perception     Praxis      Pertinent  Vitals/Pain Pain Assessment: 0-10 (Postive Pain, unable to accurately assess secondary to lethargy and cognition.)     Hand Dominance Right   Extremity/Trunk Assessment Upper Extremity Assessment Upper Extremity Assessment: Generalized weakness (History of right shoulder limitations.)           Communication Communication Communication: No difficulties   Cognition Arousal/Alertness: Lethargic   Overall Cognitive Status: History of cognitive impairments - at baseline       Memory: Decreased recall of precautions             General Comments       Exercises       Shoulder Instructions      Home Living Family/patient expects to be discharged to:: Skilled nursing facility                                        Prior Functioning/Environment Level of Independence: Needs assistance    ADL's / Homemaking Assistance Needed: Assistance with ADL.  Comments: Resides at [facility] care unit.    OT Diagnosis: Generalized weakness;Cognitive deficits;Acute pain   OT Problem List: Pain;Decreased activity tolerance;Decreased knowledge of use of DME or AE;Decreased knowledge of precautions;Decreased cognition   OT Treatment/Interventions: Self-care/ADL training;Therapeutic exercise;DME and/or AE instruction;Patient/family education;Therapeutic activities    OT Goals(Current goals can be found in the care plan section)    OT Frequency: Min 1X/week   Barriers to D/C:            Co-evaluation              End of Session    Activity Tolerance: Patient limited by lethargy Patient left: in bed;with call bell/phone within reach;with bed alarm set   Time: 1610-9604 OT Time Calculation (min): 20 min Charges:  OT General Charges $OT Visit: 1 Procedure OT Evaluation $OT Eval Low Complexity: 1 Procedure G-Codes:    Patricia Pesa, MS, OTR/L 10/03/2015, 9:55 AM

## 2015-10-03 NOTE — Anesthesia Postprocedure Evaluation (Signed)
Anesthesia Post Note  Patient: Daisy [name]  Procedure(s) Performed: Procedure(s) (LRB): INTRAMEDULLARY (IM) NAIL FEMORAL (Right)  Patient location during evaluation: Nursing Unit Anesthesia Type: Spinal Level of consciousness: awake Pain management: pain level controlled Vital Signs Assessment: post-procedure vital signs reviewed and stable Respiratory status: spontaneous breathing and nonlabored ventilation Cardiovascular status: blood pressure returned to baseline Postop Assessment: no headache and no backache Anesthetic complications: no    Last Vitals:  Filed Vitals:   10/03/15 0300 10/03/15 0800  BP: 137/69 118/66  Pulse: 80 81  Temp: 36.7 C 36.3 C  Resp: 18 16    Last Pain:  Filed Vitals:   10/03/15 0818  PainSc: Asleep                 Ginger CarneStephanie Talan Gildner

## 2015-10-04 DIAGNOSIS — N183 Chronic kidney disease, stage 3 unspecified: Secondary | ICD-10-CM

## 2015-10-04 DIAGNOSIS — K219 Gastro-esophageal reflux disease without esophagitis: Secondary | ICD-10-CM | POA: Diagnosis not present

## 2015-10-04 DIAGNOSIS — J449 Chronic obstructive pulmonary disease, unspecified: Secondary | ICD-10-CM | POA: Diagnosis not present

## 2015-10-04 DIAGNOSIS — M25551 Pain in right hip: Secondary | ICD-10-CM | POA: Diagnosis not present

## 2015-10-04 DIAGNOSIS — I252 Old myocardial infarction: Secondary | ICD-10-CM | POA: Diagnosis not present

## 2015-10-04 DIAGNOSIS — F329 Major depressive disorder, single episode, unspecified: Secondary | ICD-10-CM | POA: Diagnosis not present

## 2015-10-04 DIAGNOSIS — Z22322 Carrier or suspected carrier of Methicillin resistant Staphylococcus aureus: Secondary | ICD-10-CM | POA: Diagnosis not present

## 2015-10-04 DIAGNOSIS — R739 Hyperglycemia, unspecified: Secondary | ICD-10-CM | POA: Diagnosis not present

## 2015-10-04 DIAGNOSIS — N390 Urinary tract infection, site not specified: Secondary | ICD-10-CM | POA: Diagnosis not present

## 2015-10-04 DIAGNOSIS — E329 Disease of thymus, unspecified: Secondary | ICD-10-CM | POA: Diagnosis not present

## 2015-10-04 DIAGNOSIS — I2510 Atherosclerotic heart disease of native coronary artery without angina pectoris: Secondary | ICD-10-CM | POA: Diagnosis not present

## 2015-10-04 DIAGNOSIS — N814 Uterovaginal prolapse, unspecified: Secondary | ICD-10-CM | POA: Diagnosis not present

## 2015-10-04 DIAGNOSIS — Z9981 Dependence on supplemental oxygen: Secondary | ICD-10-CM | POA: Diagnosis not present

## 2015-10-04 DIAGNOSIS — I10 Essential (primary) hypertension: Secondary | ICD-10-CM | POA: Diagnosis not present

## 2015-10-04 DIAGNOSIS — D696 Thrombocytopenia, unspecified: Secondary | ICD-10-CM | POA: Diagnosis not present

## 2015-10-04 DIAGNOSIS — W19XXXD Unspecified fall, subsequent encounter: Secondary | ICD-10-CM | POA: Diagnosis not present

## 2015-10-04 DIAGNOSIS — F0390 Unspecified dementia without behavioral disturbance: Secondary | ICD-10-CM | POA: Diagnosis not present

## 2015-10-04 DIAGNOSIS — I129 Hypertensive chronic kidney disease with stage 1 through stage 4 chronic kidney disease, or unspecified chronic kidney disease: Secondary | ICD-10-CM | POA: Diagnosis not present

## 2015-10-04 DIAGNOSIS — M810 Age-related osteoporosis without current pathological fracture: Secondary | ICD-10-CM | POA: Diagnosis not present

## 2015-10-04 DIAGNOSIS — S72141D Displaced intertrochanteric fracture of right femur, subsequent encounter for closed fracture with routine healing: Secondary | ICD-10-CM | POA: Diagnosis not present

## 2015-10-04 DIAGNOSIS — I509 Heart failure, unspecified: Secondary | ICD-10-CM | POA: Diagnosis not present

## 2015-10-04 DIAGNOSIS — E785 Hyperlipidemia, unspecified: Secondary | ICD-10-CM | POA: Diagnosis not present

## 2015-10-04 DIAGNOSIS — D72829 Elevated white blood cell count, unspecified: Secondary | ICD-10-CM

## 2015-10-04 DIAGNOSIS — S72001A Fracture of unspecified part of neck of right femur, initial encounter for closed fracture: Secondary | ICD-10-CM | POA: Diagnosis not present

## 2015-10-04 DIAGNOSIS — Z7982 Long term (current) use of aspirin: Secondary | ICD-10-CM | POA: Diagnosis not present

## 2015-10-04 DIAGNOSIS — I161 Hypertensive emergency: Secondary | ICD-10-CM | POA: Diagnosis not present

## 2015-10-04 DIAGNOSIS — A498 Other bacterial infections of unspecified site: Secondary | ICD-10-CM

## 2015-10-04 DIAGNOSIS — M25559 Pain in unspecified hip: Secondary | ICD-10-CM | POA: Diagnosis not present

## 2015-10-04 DIAGNOSIS — M1990 Unspecified osteoarthritis, unspecified site: Secondary | ICD-10-CM | POA: Diagnosis not present

## 2015-10-04 DIAGNOSIS — B9620 Unspecified Escherichia coli [E. coli] as the cause of diseases classified elsewhere: Secondary | ICD-10-CM | POA: Diagnosis not present

## 2015-10-04 LAB — BASIC METABOLIC PANEL
Anion gap: 5 (ref 5–15)
BUN: 21 mg/dL — AB (ref 6–20)
CHLORIDE: 99 mmol/L — AB (ref 101–111)
CO2: 33 mmol/L — ABNORMAL HIGH (ref 22–32)
Calcium: 8.2 mg/dL — ABNORMAL LOW (ref 8.9–10.3)
Creatinine, Ser: 0.98 mg/dL (ref 0.44–1.00)
GFR calc Af Amer: 57 mL/min — ABNORMAL LOW (ref >60)
GFR calc non Af Amer: 49 mL/min — ABNORMAL LOW (ref >60)
GLUCOSE: 138 mg/dL — AB (ref 65–99)
POTASSIUM: 3.8 mmol/L (ref 3.5–5.1)
SODIUM: 137 mmol/L (ref 135–145)

## 2015-10-04 LAB — URINALYSIS COMPLETE WITH MICROSCOPIC (ARMC ONLY)
Bacteria, UA: NONE SEEN
Bilirubin Urine: NEGATIVE
GLUCOSE, UA: NEGATIVE mg/dL
Hgb urine dipstick: NEGATIVE
KETONES UR: NEGATIVE mg/dL
Nitrite: NEGATIVE
Protein, ur: 30 mg/dL — AB
SPECIFIC GRAVITY, URINE: 1.005 (ref 1.005–1.030)
pH: 5 (ref 5.0–8.0)

## 2015-10-04 LAB — CBC
HCT: 30 % — ABNORMAL LOW (ref 35.0–47.0)
HEMOGLOBIN: 10 g/dL — AB (ref 12.0–16.0)
MCH: 30.1 pg (ref 26.0–34.0)
MCHC: 33.3 g/dL (ref 32.0–36.0)
MCV: 90.5 fL (ref 80.0–100.0)
Platelets: 162 10*3/uL (ref 150–440)
RBC: 3.31 MIL/uL — AB (ref 3.80–5.20)
RDW: 14.6 % — ABNORMAL HIGH (ref 11.5–14.5)
WBC: 10 10*3/uL (ref 3.6–11.0)

## 2015-10-04 MED ORDER — FLEET ENEMA 7-19 GM/118ML RE ENEM: 1.0000 | ENEMA | Freq: Every day | RECTAL | Status: DC | PRN

## 2015-10-04 MED ORDER — DOCUSATE SODIUM 100 MG PO CAPS: 100.0000 mg | ORAL_CAPSULE | Freq: Two times a day (BID) | ORAL | Status: DC

## 2015-10-04 MED ORDER — ACETAMINOPHEN 325 MG PO TABS: 650.0000 mg | ORAL_TABLET | Freq: Four times a day (QID) | ORAL | Status: DC | PRN

## 2015-10-04 MED ORDER — ENOXAPARIN SODIUM 40 MG/0.4ML ~~LOC~~ SOLN: 40.0000 mg | SUBCUTANEOUS | Status: DC

## 2015-10-04 MED ORDER — POLYETHYLENE GLYCOL 3350 17 G PO PACK: 17.0000 g | PACK | Freq: Every day | ORAL | Status: DC | PRN

## 2015-10-04 MED ORDER — FERROUS SULFATE 325 (65 FE) MG PO TABS: 325.0000 mg | ORAL_TABLET | Freq: Three times a day (TID) | ORAL | Status: DC

## 2015-10-04 MED ORDER — MAGNESIUM CITRATE PO SOLN
1.0000 | Freq: Once | ORAL | Status: DC
  Filled 2015-10-04: qty 296

## 2015-10-04 MED ORDER — BISACODYL 10 MG RE SUPP: 10.0000 mg | Freq: Every day | RECTAL | Status: DC | PRN

## 2015-10-04 MED ORDER — OXYCODONE HCL 5 MG PO TABS: 5.0000 mg | ORAL_TABLET | ORAL | Status: DC | PRN

## 2015-10-04 MED ORDER — CEPHALEXIN 250 MG/5ML PO SUSR: 250.0000 mg | Freq: Three times a day (TID) | ORAL | Status: DC

## 2015-10-04 MED ORDER — SENNA 8.6 MG PO TABS: 1.0000 | ORAL_TABLET | Freq: Two times a day (BID) | ORAL | Status: DC

## 2015-10-04 NOTE — [place] FL2 (Signed)
[place] MEDICAID FL2 LEVEL OF CARE SCREENING TOOL     IDENTIFICATION  Patient Name: Daisy [name] Birthdate: 1921-08-19 Sex: female Admission Date (Current Location): 09/30/2015  [place]   Facility and Address:  [facility], [address], [place], [place] [address]      Provider Number: [id number]  Attending Physician Name and Address:  Katharina Caperima Vaickute, MD  Relative Name and Phone Number:       Current Level of Care: Hospital Recommended Level of Care: Skilled Nursing Facility Prior Approval Number:    Date Approved/Denied:   PASRR Number: [id number]  Discharge Plan: SNF    Current Diagnoses: Patient Active Problem List   Diagnosis Date Noted  . Infection of urinary tract 10/04/2015  . Escherichia coli (E. coli) infection 10/04/2015  . CKD (chronic kidney disease), stage III 10/04/2015  . Hyperglycemia 10/04/2015  . MRSA carrier 10/04/2015  . Leukocytosis 10/04/2015  . Thrombocytopenia (HCC) 10/04/2015  . Closed right hip fracture (HCC) 09/30/2015  . Recurrent UTI 11/12/2014  . Microscopic hematuria 11/12/2014  . Atrophic vaginitis 11/12/2014  . CVA (cerebral infarction) 06/05/2013  . Hypotension, unspecified 06/05/2013  . Other and unspecified hyperlipidemia 06/05/2013  . Acute renal failure (HCC) 06/05/2013  . Leukocytosis, unspecified 06/05/2013  . Essential hypertension, benign 06/05/2013  . Depression 06/05/2013  . GERD (gastroesophageal reflux disease) 06/05/2013    Orientation RESPIRATION BLADDER Height & Weight     Self, Situation  Normal Incontinent Weight: 189 lb 8 oz (85.957 kg) Height:  5\' 2"  (157.5 cm)  BEHAVIORAL SYMPTOMS/MOOD NEUROLOGICAL BOWEL NUTRITION STATUS      Incontinent Diet (Normal)  AMBULATORY STATUS COMMUNICATION OF NEEDS Skin   Extensive Assist Verbally Normal                       Personal Care Assistance Level of Assistance  Bathing, Feeding, Dressing, Total  care Bathing Assistance: Maximum assistance Feeding assistance: Maximum assistance Dressing Assistance: Maximum assistance Total Care Assistance: Maximum assistance   Functional Limitations Info  Sight, Hearing, Speech Sight Info: Adequate Hearing Info: Impaired Speech Info: Adequate    SPECIAL CARE FACTORS FREQUENCY                       Contractures Contractures Info: Not present    Additional Factors Info  Allergies   Allergies Info: Aricept,coedine,Ketek           Current Medications (10/04/2015):  This is the current hospital active medication list Current Facility-Administered Medications  Medication Dose Route Frequency Provider Last Rate Last Dose  . 0.9 %  sodium chloride infusion  75 mL/hr Intravenous Continuous Juanell FairlyKevin Krasinski, MD 75 mL/hr at 10/02/15 0511 75 mL/hr at 10/02/15 0511  . acetaminophen (TYLENOL) tablet 650 mg  650 mg Oral Q6H PRN Juanell FairlyKevin Krasinski, MD   650 mg at 10/04/15 0915   Or  . acetaminophen (TYLENOL) suppository 650 mg  650 mg Rectal Q6H PRN Juanell FairlyKevin Krasinski, MD      . albuterol (PROVENTIL) (2.5 MG/3ML) 0.083% nebulizer solution 2.5 mg  2.5 mg Nebulization Q2H PRN Shaune PollackQing Chen, MD      . alum & mag hydroxide-simeth (MAALOX/MYLANTA) 200-200-20 MG/5ML suspension 30 mL  30 mL Oral Q4H PRN Juanell FairlyKevin Krasinski, MD      . bisacodyl (DULCOLAX) suppository 10 mg  10 mg Rectal Daily PRN Juanell FairlyKevin Krasinski, MD      . carvedilol (COREG) tablet 6.25 mg  6.25 mg  Oral BID WC Katharina Caper, MD   6.25 mg at 10/04/15 0917  . cephALEXin (KEFLEX) 250 MG/5ML suspension 250 mg  250 mg Oral Q8H Katharina Caper, MD   250 mg at 10/04/15 0541  . citalopram (CELEXA) tablet 40 mg  40 mg Oral Daily Gardner Candle, RPH   40 mg at 10/04/15 0917  . docusate sodium (COLACE) capsule 100 mg  100 mg Oral BID Juanell Fairly, MD   100 mg at 10/04/15 0914  . enoxaparin (LOVENOX) injection 40 mg  40 mg Subcutaneous Q24H Juanell Fairly, MD   40 mg at 10/04/15 0913  . ferrous sulfate  tablet 325 mg  325 mg Oral TID PC Juanell Fairly, MD   325 mg at 10/04/15 0915  . hydrALAZINE (APRESOLINE) injection 10 mg  10 mg Intravenous Q6H PRN Shaune Pollack, MD   10 mg at 10/01/15 0909  . magnesium citrate solution 1 Bottle  1 Bottle Oral Once PRN Juanell Fairly, MD      . magnesium hydroxide (MILK OF MAGNESIA) suspension 30 mL  30 mL Oral Daily PRN Katharina Caper, MD   30 mL at 10/03/15 1004  . menthol-cetylpyridinium (CEPACOL) lozenge 3 mg  1 lozenge Oral PRN Juanell Fairly, MD       Or  . phenol (CHLORASEPTIC) mouth spray 1 spray  1 spray Mouth/Throat PRN Juanell Fairly, MD      . mometasone-formoterol [facility]) 200-5 MCG/ACT inhaler 2 puff  2 puff Inhalation BID Shaune Pollack, MD   2 puff at 10/04/15 0914  . morphine 2 MG/ML injection 2 mg  2 mg Intravenous Q2H PRN Juanell Fairly, MD   2 mg at 10/01/15 1954  . nystatin (MYCOSTATIN) powder   Topical BID Shaune Pollack, MD      . ondansetron [facility]) tablet 4 mg  4 mg Oral Q6H PRN Juanell Fairly, MD       Or  . ondansetron [facility]) injection 4 mg  4 mg Intravenous Q6H PRN Juanell Fairly, MD      . oxyCODONE (Oxy IR/ROXICODONE) immediate release tablet 5 mg  5 mg Oral Q4H PRN Katharina Caper, MD   5 mg at 10/03/15 1616  . pantoprazole (PROTONIX) EC tablet 40 mg  40 mg Oral Daily Shaune Pollack, MD   40 mg at 10/04/15 0915  . polyethylene glycol (MIRALAX / GLYCOLAX) packet 17 g  17 g Oral Daily PRN Juanell Fairly, MD   17 g at 10/03/15 2243  . senna (SENOKOT) tablet 8.6 mg  1 tablet Oral BID Juanell Fairly, MD   8.6 mg at 10/04/15 0915  . simvastatin (ZOCOR) tablet 20 mg  20 mg Oral QPM Shaune Pollack, MD   20 mg at 10/03/15 1743  . tiotropium (SPIRIVA) inhalation capsule 18 mcg  18 mcg Inhalation Daily Shaune Pollack, MD   18 mcg at 10/04/15 [phone]  . tobramycin (TOBREX) 0.3 % ophthalmic solution 1 drop  1 drop Both Eyes QID Shaune Pollack, MD   1 drop at 10/04/15 [phone]  . vitamin B-12 (CYANOCOBALAMIN) tablet 1,000 mcg  1,000 mcg Oral Daily Shaune Pollack, MD   1,000 mcg  at 10/04/15 5409     Discharge Medications: Please see discharge summary for a list of discharge medications.  Relevant Imaging Results:  Relevant Lab Results:   Additional Information SSN [id number]  Haig Prophet, LCSW

## 2015-10-04 NOTE — Discharge Summary (Signed)
[facility] [place] [facility]   PATIENT NAME: Daisy [name]    MR#:  [id number]  DATE OF BIRTH:  Jan 11, 1922  DATE OF ADMISSION:  09/30/2015 ADMITTING PHYSICIAN: Shaune Pollack, MD  DATE OF DISCHARGE: No discharge date for patient encounter.  PRIMARY CARE PHYSICIAN: BABAOFF, MARC E, MD     ADMISSION DIAGNOSIS:  UTI (lower urinary tract infection) [N39.0] Fall [W19.XXXA] Fall, initial encounter [id number]] Hip fracture, right, closed, initial encounter (HCC) [S72.001A]  DISCHARGE DIAGNOSIS:  Principal Problem:   Closed right hip fracture (HCC) Active Problems:   Infection of urinary tract   Escherichia coli (E. coli) infection   CKD (chronic kidney disease), stage III   Hyperglycemia   MRSA carrier   Leukocytosis   Thrombocytopenia (HCC)   SECONDARY DIAGNOSIS:   Past Medical History  Diagnosis Date  . Other and unspecified hyperlipidemia 06/05/2013  . Essential hypertension, benign 06/05/2013  . Depression 06/05/2013  . GERD (gastroesophageal reflux disease) 06/05/2013  . Recurrent UTI   . Paronychia, toe   . Anemia   . Acute renal failure (HCC)   . Cerebral artery occlusion (HCC)     with cerebral infarction  . Dementia   . Asthma   . Edema   . Osteoarthritis   . Stroke (HCC)   . Heart attack (HCC)     .pro HOSPITAL COURSE:   The patient is an 82 year old Caucasian female with respiratory history significant for history of hyperlipidemia, hypertension, depression, gastroesophageal reflux disease, recurrent urinary tract infections, dementia, who presents to the hospital after fall. Apparently patient was walking in assisted living facility and had an unwitnessed fall, patient was unable to give details due to dementia, but complained of right hip pain. On arrival to the hospital patient was noted to have comminuted intertrochanteric fracture, labs revealed pyuria, concerning for urinary tract infection, Mild renal insufficiency,  hyperglycemia. Patient's MRSA PCR was checked and it was found to be positive. Hemoglobin A1c was found to be 5.7, signifying no diabetes mellitus. Patient underwent intramedullary nail placement on 10/01/2015 by Dr. Martha Clan and did well with physical therapy, however, was recommended to be discharged to skilled nursing facility for rehabilitation. Urinary cultures grew Escherichia coli, more than 100,000 colony-forming units, sensitive to almost all antibiotics but resistant to ciprofloxacin, ampicillin, intermediately sensitive to ampicillin/ sulbactam. Patient was managed on the Rocephin while in the hospital, however, as soon as her urinary cultures came back, she was changed to Keflex, which is recommended to be continued until completion of the course. Discussion by problem #1 comminuted intratrochanteric right hip fracture, closed, status post intramedullary nail placement on 10/01/2015 by Dr. Martha Clan, continue pain medications as needed, pain control is adequate. Physical therapist evaluated patient and recommended skilled nursing facility placement for rehabilitation. Continue supportive therapy. We'll be discharging patient today. Follow-up with Dr. Martha Clan in about 2 weeks after discharge. Patient is allowed to continue partial weight bearing on the right lower extremity until she is seen by orthopedist surgeon in about 2 weeks. She is recommended to continue TED stockings on Lovenox for DVT prophylaxis.  #2. Urinary tract infection due to Escherichia coli, Escherichia coli sensitive to Rocephin, now changed to Keflex, continue antibiotic for 5 more days to complete course  #3. CK D stage III, stable with therapy, no intervention #4. Hyperglycemia, hemoglobin A1c was 5.7, no diabetes mellitus #5. MRSA carrier, continue therapy with Bactroban intranasally #6. Leukocytosis, due to operation, stress, resolved #7. Thrombocytopenia, likely consumption. Resolved  #8. Acute  posthemorrhagic anemia  , about 2 g drop postoperatively, continue iron supplementation orally, follow hemoglobin level closely as outpatient  DISCHARGE CONDITIONS:   Stable  CONSULTS OBTAINED:  Treatment Team:  Juanell Fairly, MD  DRUG ALLERGIES:   Allergies  Allergen Reactions  . Aricept [Donepezil Hcl] Other (See Comments)    "has nightmares"  . Codeine     REACTION: Pt. unsure.  Marland Kitchen Ketek [Telithromycin] Other (See Comments)    "fearful state, not herself"    DISCHARGE MEDICATIONS:   Current Discharge Medication List    START taking these medications   Details  acetaminophen (TYLENOL) 325 MG tablet Take 2 tablets (650 mg total) by mouth every 6 (six) hours as needed for mild pain (or Fever >/= 101). Qty: 100 tablet, Refills: 0    bisacodyl (DULCOLAX) 10 MG suppository Place 1 suppository (10 mg total) rectally daily as needed for moderate constipation. Qty: 12 suppository, Refills: 0    cephALEXin (KEFLEX) 250 MG/5ML suspension Take 5 mLs (250 mg total) by mouth every 8 (eight) hours. Qty: 100 mL, Refills: 0    docusate sodium (COLACE) 100 MG capsule Take 1 capsule (100 mg total) by mouth 2 (two) times daily. Qty: 10 capsule, Refills: 0    enoxaparin (LOVENOX) 40 MG/0.4ML injection Inject 0.4 mLs (40 mg total) into the skin daily. Qty: 14 Syringe, Refills: 0    ferrous sulfate 325 (65 FE) MG tablet Take 1 tablet (325 mg total) by mouth 3 (three) times daily after meals. Qty: 90 tablet, Refills: 3    oxyCODONE (OXY IR/ROXICODONE) 5 MG immediate release tablet Take 1 tablet (5 mg total) by mouth every 4 (four) hours as needed for breakthrough pain ((for MODERATE breakthrough pain)). Qty: 30 tablet, Refills: 0    polyethylene glycol (MIRALAX / GLYCOLAX) packet Take 17 g by mouth daily as needed for mild constipation. Qty: 14 each, Refills: 0    !! senna (SENOKOT) 8.6 MG TABS tablet Take 1 tablet (8.6 mg total) by mouth 2 (two) times daily. Qty: 120 each, Refills: 0     !! - Potential  duplicate medications found. Please discuss with provider.    CONTINUE these medications which have NOT CHANGED   Details  aspirin 325 MG tablet Take 1 tablet (325 mg total) by mouth daily.    carvedilol (COREG) 12.5 MG tablet Take 1 tablet (12.5 mg total) by mouth 2 (two) times daily with a meal. Qty: 62 tablet, Refills: 0    citalopram (CELEXA) 20 MG tablet Take 40 mg by mouth daily.    Cyanocobalamin (RA VITAMIN B-12 TR) 1000 MCG TBCR Take 1 tablet by mouth daily.     Fluticasone-Salmeterol (ADVAIR) 250-50 MCG/DOSE AEPB Inhale 1 puff into the lungs 2 (two) times daily.    furosemide (LASIX) 40 MG tablet Take 40 mg by mouth daily.    nystatin (NYSTATIN) powder Apply topically 2 (two) times daily.    omeprazole (PRILOSEC) 20 MG capsule Take 20 mg by mouth daily.    !! senna (SENOKOT) 8.6 MG TABS tablet Take 1 tablet by mouth at bedtime.    simvastatin (ZOCOR) 20 MG tablet Take 20 mg by mouth every evening.     tiotropium (SPIRIVA) 18 MCG inhalation capsule Place 18 mcg into inhaler and inhale daily.    tobramycin (TOBREX) 0.3 % ophthalmic solution Place 1 drop into both eyes 4 (four) times daily.     !! - Potential duplicate medications found. Please discuss with provider.  DISCHARGE INSTRUCTIONS:    Patient is to follow-up with primary care physician and orthopedist surgeon in about 1-2 weeks after discharge  If you experience worsening of your admission symptoms, develop shortness of breath, life threatening emergency, suicidal or homicidal thoughts you must seek medical attention immediately by calling 911 or calling your MD immediately  if symptoms less severe.  You Must read complete instructions/literature along with all the possible adverse reactions/side effects for all the Medicines you take and that have been prescribed to you. Take any new Medicines after you have completely understood and accept all the possible adverse reactions/side effects.   Please  note  You were cared for by a hospitalist during your hospital stay. If you have any questions about your discharge medications or the care you received while you were in the hospital after you are discharged, you can call the unit and asked to speak with the hospitalist on call if the hospitalist that took care of you is not available. Once you are discharged, your primary care physician will handle any further medical issues. Please note that NO REFILLS for any discharge medications will be authorized once you are discharged, as it is imperative that you return to your primary care physician (or establish a relationship with a primary care physician if you do not have one) for your aftercare needs so that they can reassess your need for medications and monitor your lab values.    Today   CHIEF COMPLAINT:   Chief Complaint  Patient presents with  . Fall    HISTORY OF PRESENT ILLNESS:  Daisy [name]  is a 82 y.o. female with a known history of hyperlipidemia, hypertension, depression, gastroesophageal reflux disease, recurrent urinary tract infections, dementia, who presents to the hospital after fall. Apparently patient was walking in assisted living facility and had an unwitnessed fall, patient was unable to give details due to dementia, but complained of right hip pain. On arrival to the hospital patient was noted to have comminuted intertrochanteric fracture, labs revealed pyuria, concerning for urinary tract infection, Mild renal insufficiency, hyperglycemia. Patient's MRSA PCR was checked and it was found to be positive. Hemoglobin A1c was found to be 5.7, signifying no diabetes mellitus. Patient underwent intramedullary nail placement on 10/01/2015 by Dr. Martha ClanKrasinski and did well with physical therapy, however, was recommended to be discharged to skilled nursing facility for rehabilitation. Urinary cultures grew Escherichia coli, more than 100,000 colony-forming units, sensitive to almost all  antibiotics but resistant to ciprofloxacin, ampicillin, intermediately sensitive to ampicillin/ sulbactam. Patient was managed on the Rocephin while in the hospital, however, as soon as her urinary cultures came back, she was changed to Keflex, which is recommended to be continued until completion of the course. Discussion by problem #1 comminuted intratrochanteric right hip fracture, closed, status post intramedullary nail placement on 10/01/2015 by Dr. Martha ClanKrasinski, continue pain medications as needed, pain control is adequate. Physical therapist evaluated patient and recommended skilled nursing facility placement for rehabilitation. Continue supportive therapy. We'll be discharging patient today. Follow-up with Dr. Martha ClanKrasinski in about 2 weeks after discharge. Patient is allowed to continue partial weight bearing on the right lower extremity until she is seen by orthopedist surgeon in about 2 weeks. She is recommended to continue TED stockings on Lovenox for DVT prophylaxis.  #2. Urinary tract infection due to Escherichia coli, Escherichia coli sensitive to Rocephin, now changed to Keflex, continue antibiotic for 5 more days to complete course  #3. CK D stage III, stable with therapy,  no intervention #4. Hyperglycemia, hemoglobin A1c was 5.7, no diabetes mellitus #5. MRSA carrier, continue therapy with Bactroban intranasally #6. Leukocytosis, due to operation, stress, resolved #7. Thrombocytopenia, likely consumption. Resolved  #8. Acute posthemorrhagic anemia , about 2 g drop postoperatively, continue iron supplementation orally, follow hemoglobin level closely as outpatient     VITAL SIGNS:  Blood pressure 124/62, pulse 93, temperature 99.7 F (37.6 C), temperature source Axillary, resp. rate 17, height 5\' 2"  (1.575 m), weight 85.957 kg (189 lb 8 oz), SpO2 99 %.  I/O:   Intake/Output Summary (Last 24 hours) at 10/04/15 0911 Last data filed at 10/04/15 0600  Gross per 24 hour  Intake    305 ml   Output      0 ml  Net    305 ml    PHYSICAL EXAMINATION:  GENERAL:  82 y.o.-year-old patient lying in the bed with no acute distress.  EYES: Pupils equal, round, reactive to light and accommodation. No scleral icterus. Extraocular muscles intact.  HEENT: Head atraumatic, normocephalic. Oropharynx and nasopharynx clear.  NECK:  Supple, no jugular venous distention. No thyroid enlargement, no tenderness.  LUNGS: Normal breath sounds bilaterally, no wheezing, rales,rhonchi or crepitation. No use of accessory muscles of respiration.  CARDIOVASCULAR: S1, S2 normal. No murmurs, rubs, or gallops.  ABDOMEN: Soft, non-tender, non-distended. Bowel sounds present. No organomegaly or mass.  EXTREMITIES: No pedal edema, cyanosis, or clubbing.  NEUROLOGIC: Cranial nerves II through XII are intact. Muscle strength 5/5 in all extremities. Sensation intact. Gait not checked.  PSYCHIATRIC: The patient is alert and oriented x 3.  SKIN: No obvious rash, lesion, or ulcer.   DATA REVIEW:   CBC  Recent Labs Lab 10/04/15 0326  WBC 10.0  HGB 10.0*  HCT 30.0*  PLT 162    Chemistries   Recent Labs Lab 09/30/15 1629  10/04/15 0326  NA 140  < > 137  K 3.7  < > 3.8  CL 100*  < > 99*  CO2 34*  < > 33*  GLUCOSE 115*  < > 138*  BUN 16  < > 21*  CREATININE 1.18*  < > 0.98  CALCIUM 8.7*  < > 8.2*  AST 15  --   --   ALT 9*  --   --   ALKPHOS 69  --   --   BILITOT 0.4  --   --   < > = values in this interval not displayed.  Cardiac Enzymes  Recent Labs Lab 09/30/15 1629  TROPONINI <0.03    Microbiology Results  Results for orders placed or performed during the hospital encounter of 09/30/15  Urine culture     Status: Abnormal   Collection Time: 09/30/15  4:30 PM  Result Value Ref Range Status   Specimen Description URINE, CATHETERIZED  Final   Special Requests Normal  Final   Culture >=100,000 COLONIES/mL ESCHERICHIA COLI (A)  Final   Report Status 10/03/2015 FINAL  Final   Organism  ID, Bacteria ESCHERICHIA COLI (A)  Final      Susceptibility   Escherichia coli - MIC*    AMPICILLIN >=32 RESISTANT Resistant     CEFAZOLIN <=4 SENSITIVE Sensitive     CEFTRIAXONE <=1 SENSITIVE Sensitive     CIPROFLOXACIN >=4 RESISTANT Resistant     GENTAMICIN <=1 SENSITIVE Sensitive     IMIPENEM <=0.25 SENSITIVE Sensitive     NITROFURANTOIN <=16 SENSITIVE Sensitive     TRIMETH/SULFA <=20 SENSITIVE Sensitive     AMPICILLIN/SULBACTAM 16 INTERMEDIATE  Intermediate     PIP/TAZO <=4 SENSITIVE Sensitive     Extended ESBL NEGATIVE Sensitive     * >=100,000 COLONIES/mL ESCHERICHIA COLI  Surgical pcr screen     Status: Abnormal   Collection Time: 09/30/15  9:30 PM  Result Value Ref Range Status   MRSA, PCR POSITIVE (A) NEGATIVE Final    Comment: CRITICAL RESULT CALLED TO, READ BACK BY AND VERIFIED WITH: MATT PAGE AT 2324 09/30/15.PMH    Staphylococcus aureus POSITIVE (A) NEGATIVE Final    Comment:        The Xpert SA Assay (FDA approved for NASAL specimens in patients over 27 years of age), is one component of a comprehensive surveillance program.  Test performance has been validated by [facility] for patients greater than or equal to 61 year old. It is not intended to diagnose infection nor to guide or monitor treatment.     RADIOLOGY:  No results found.  EKG:   Orders placed or performed during the hospital encounter of 09/30/15  . ED EKG  . ED EKG      Management plans discussed with the patient, family and they are in agreement.  CODE STATUS:  Code Status History    Date Active Date Inactive Code Status Order ID Comments User Context   09/30/2015  5:46 PM 10/01/2015  4:34 PM Full Code [id number]  Juanell Fairly, MD ED   06/05/2013  1:20 AM 06/09/2013  9:41 PM Full Code [id number]  Alison Murray, MD Inpatient    Advance Directive Documentation        Most Recent Value   Type of Advance Directive  Healthcare Power of Attorney, Living will   Pre-existing out of  facility DNR order (yellow form or pink MOST form)     "MOST" Form in Place?        TOTAL TIME TAKING CARE OF THIS PATIENT: 40  minutes.    Katharina Caper M.D on 10/04/2015 at 9:11 AM  Between 7am to 6pm - Pager - [phone]  After 6pm go to [email or web address] - password [email or web address] [facility]  [place] [facility]  Office  [phone]  CC: Primary care physician; BABAOFF, Lavada Mesi, MD

## 2015-10-04 NOTE — Progress Notes (Signed)
EMS arrived  And transported patient at 4:38pm. VS stable upon leaving.

## 2015-10-04 NOTE — Progress Notes (Signed)
Physical Therapy Treatment Patient Details Name: Daisy [name] MRN: [id number] DOB: March 14, 1922 Today's Date: 10/04/2015    History of Present Illness Daisy [name] is a 82yo white female who comes to [facility] p retropulsion at [facility], found to have sustained a R hip Fracture. Pt presents on POD1 s/p ORIF. At baseline, pt requires min-modA for transfers, mod-maxA for bed mobility, and walks with CGA for limited community distances.Pt has early stage dementia, PMH: UTI, CVA, HTN, MI. Pt has lived at [facility] for about 1 year per daughter.     PT Comments    Pt awake and ready for session.  Max a +2 to transition to edge of bed.  Resists hip/trunk flexion which makes transition difficult.  Sitting edge of bed with min guard +1.  Pt stood with walker x 2 with mod a x 2 to stand but once up she was able to stand with min guard x 2.  Responded well to verbal cues to stand up fully.  She maintains PWB well.  For safety hand held max a x 2 transfer to chair was performed as she is unable to step/pivot on her feet.  Seated AAROM as described below to RLE.  Primary nurse in for session.   Follow Up Recommendations  SNF     Equipment Recommendations       Recommendations for Other Services       Precautions / Restrictions Precautions Precaution Comments: ISO-Contact Restrictions Weight Bearing Restrictions: Yes RLE Weight Bearing: Partial weight bearing    Mobility  Bed Mobility Overal bed mobility: Needs Assistance Bed Mobility: Sit to Supine     Supine to sit: +2 for physical assistance;Max assist Sit to supine: Max assist;+2 for safety/equipment   General bed mobility comments: resists hip/trunk flexion during transition  Transfers Overall transfer level: Needs assistance Equipment used: Rolling walker (2 wheeled) Transfers: Sit to/from [employer] Sit to Stand: +2 physical assistance;Mod assist (with walker) Stand pivot transfers: Max assist;+2 physical  assistance (hand held assist)       General transfer comment: able to stand more erect and for longer periods today, unable to transfer with walker  Ambulation/Gait                 Stairs            Wheelchair Mobility    Modified Rankin (Stroke Patients Only)       Balance Overall balance assessment: Needs assistance Sitting-balance support: Feet supported Sitting balance-[name] Scale: Poor     Standing balance support: Bilateral upper extremity supported Standing balance-[name] Scale: Zero                      Cognition Arousal/Alertness: Awake/alert Behavior During Therapy: WFL for tasks assessed/performed Overall Cognitive Status: History of cognitive impairments - at baseline       Memory: Decreased recall of precautions              Exercises Other Exercises Other Exercises: seated LAQ 2 x 10 AAROM    General Comments        Pertinent Vitals/Pain Pain Assessment: 0-10 Pain Score: 5  Pain Location: R hip Pain Descriptors / Indicators: Aching Pain Intervention(s): Limited activity within patient's tolerance (MD limiting pain meds due to lethargy per nsg)    Home Living                      Prior Function  PT Goals (current goals can now be found in the care plan section)      Frequency  BID    PT Plan Current plan remains appropriate    Co-evaluation             End of Session Equipment Utilized During Treatment: Gait belt Activity Tolerance: Patient limited by lethargy;Patient limited by fatigue Patient left: in chair;with call bell/phone within reach;with nursing/sitter in room;with family/visitor present     Time: [id number] PT Time Calculation (min) (ACUTE ONLY): 23 min  Charges:  $Therapeutic Exercise: 8-22 mins $Therapeutic Activity: 8-22 mins                    G Codes:      Danielle DessSarah Sadaf Przybysz, PTA 10/04/2015, 9:31 AM

## 2015-10-04 NOTE — Progress Notes (Signed)
Called report to Era Pollie Friarodd, L[facility]. EMS called for transport

## 2015-10-04 NOTE — Progress Notes (Signed)
Patient is medically stable for D/C to [facility] today. Health Team authorization has been received. Auth # [id number]. [facility] patient will go to room 503. RN will call report to 500 hall and arrange EMS for transport. Clinical Child psychotherapistocial Worker (CSW) sent D/C Summary, FL2 and D/C Packet to [facility] via [facility]. Patient's daughter is at bedside and aware of D/C today. Please reconsult if future social work needs arise. CSW signing off.   Jetta LoutBailey Morgan, LCSW [phone]

## 2015-10-04 NOTE — Progress Notes (Signed)
Patient with large bm, fleet enema not needed at this time

## 2015-10-04 NOTE — Progress Notes (Signed)
Subjective:  Postoperative day #2 status post intramedullary fixation for right intertrochanteric hip fracture. Patient reports pain as mild.  Patient is up out of bed to a chair. She denies any significant hip pain. She has no other complaints. Her daughter is at the bedside.  Objective:   VITALS:   Filed Vitals:   10/03/15 1700 10/03/15 1940 10/04/15 0503 10/04/15 0830  BP: 138/62 110/58 124/62 126/64  Pulse: 74 72 93   Temp: 97.4 F (36.3 C) 98.5 F (36.9 C) 99.7 F (37.6 C) 99.1 F (37.3 C)  TempSrc: Oral Axillary Axillary Axillary  Resp: Height:      Weight:      SpO2: 97% 93% 99% 96%    PHYSICAL EXAM:  Right lower extremity: Patient's incision is clean dry and intact. Her bandage remains clean dry and intact as well. Her thigh is mildly swollen but her compartments are soft and compressible. She has chronic bilateral lower extremity edema. She has palpable pedal pulses, intact sensation light touch throughout the right lower extremity. She has intact motor function right lower extremity as well.  LABS  Results for orders placed or performed during the hospital encounter of 09/30/15 (from the past 24 hour(s))  CBC     Status: Abnormal   Collection Time: 10/04/15  3:26 AM  Result Value Ref Range   WBC 10.0 3.6 - 11.0 K/uL   RBC 3.31 (L) 3.80 - 5.20 MIL/uL   Hemoglobin 10.0 (L) 12.0 - 16.0 g/dL   HCT 16.1 (L) 09.6 - 04.5 %   MCV 90.5 80.0 - 100.0 fL   MCH 30.1 26.0 - 34.0 pg   MCHC 33.3 32.0 - 36.0 g/dL   RDW 40.9 (H) 81.1 - 91.4 %   Platelets 162 150 - 440 K/uL  Basic metabolic panel     Status: Abnormal   Collection Time: 10/04/15  3:26 AM  Result Value Ref Range   Sodium 137 135 - 145 mmol/L   Potassium 3.8 3.5 - 5.1 mmol/L   Chloride 99 (L) 101 - 111 mmol/L   CO2 33 (H) 22 - 32 mmol/L   Glucose, Bld 138 (H) 65 - 99 mg/dL   BUN 21 (H) 6 - 20 mg/dL   Creatinine, Ser 7.82 0.44 - 1.00 mg/dL   Calcium 8.2 (L) 8.9 - 10.3 mg/dL   GFR calc non Af  Amer 49 (L) >60 mL/min   GFR calc Af Amer 57 (L) >60 mL/min   Anion gap 5 5 - 15  Urinalysis complete, with microscopic ([facility] only)     Status: Abnormal   Collection Time: 10/04/15  7:59 AM  Result Value Ref Range   Color, Urine YELLOW (A) YELLOW   APPearance CLEAR (A) CLEAR   Glucose, UA NEGATIVE NEGATIVE mg/dL   Bilirubin Urine NEGATIVE NEGATIVE   Ketones, ur NEGATIVE NEGATIVE mg/dL   Specific Gravity, Urine 1.005 1.005 - 1.030   Hgb urine dipstick NEGATIVE NEGATIVE   pH 5.0 5.0 - 8.0   Protein, ur 30 (A) NEGATIVE mg/dL   Nitrite NEGATIVE NEGATIVE   Leukocytes, UA 1+ (A) NEGATIVE   RBC / HPF 0-5 0 - 5 RBC/hpf   WBC, UA 6-30 0 - 5 WBC/hpf   Bacteria, UA NONE SEEN NONE SEEN   Squamous Epithelial / LPF 6-30 (A) NONE SEEN    No results found.  Assessment/Plan: 3 Days Post-Op   Principal Problem:   Closed right hip fracture (HCC) Active Problems:   Infection  of urinary tract   Escherichia coli (E. coli) infection   CKD (chronic kidney disease), stage III   Hyperglycemia   MRSA carrier   Leukocytosis   Thrombocytopenia (HCC)  Patient is going to skilled nursing facility today. She has done well postop. She will remain on Lovenox for DVT prophylaxis along with her TED stockings. Patient will continue physical and occupational therapy and is partial weightbearing on the right lower extremity. She'll follow-up with me in 10-14 days. Patient will be weaned off her nasal supplemental O2 as her latest oxygenation levels were good. Patient has been given a suppository is willing to have a bowel movement prior to discharge. I have answered all questions by the patient's daughter  I have written for the patient to receive subsidence enema or Fleet enema if necessary. I also ordered mag citrate.    Juanell FairlyKRASINSKI, Imo [place] , MD 10/04/2015, 1:19 PM

## 2015-10-04 NOTE — Clinical Social Work Placement (Signed)
   CLINICAL SOCIAL WORK PLACEMENT  NOTE  Date:  10/04/2015  Patient Details  Name: Franki CabotMolene T Buttacavoli MRN: [id number] Date of Birth: 05/25/1921  Clinical Social Work is seeking post-discharge placement for this patient at the Skilled  Nursing Facility level of care (*CSW will initial, date and re-position this form in  chart as items are completed):  Yes   Patient/family provided with [facility] Clinical Social Work Department's list of facilities offering this level of care within the geographic area requested by the patient (or if unable, by the patient's family).  Yes   Patient/family informed of their freedom to choose among providers that offer the needed level of care, that participate in Medicare, Medicaid or managed care program needed by the patient, have an available bed and are willing to accept the patient.  Yes   Patient/family informed of [facility]'s ownership interest in [facility] and [facility], as well as of the fact that they are under no obligation to receive care at these facilities.  PASRR submitted to EDS on       PASRR number received on       Existing PASRR number confirmed on 10/02/15     FL2 transmitted to all facilities in geographic area requested by pt/family on 10/02/15     FL2 transmitted to all facilities within larger geographic area on       Patient informed that his/her managed care company has contracts with or will negotiate with certain facilities, including the following:        Yes   Patient/family informed of bed offers received.  Patient chooses bed at  [facility] )     Physician recommends and patient chooses bed at      Patient to be transferred to  [facility] ) on 10/04/15.  Patient to be transferred to facility by  [facility] )     Patient family notified on 10/04/15 of transfer.  Name of family member notified:   (Patient's daughter is at bedside and aware of D/C today. )     PHYSICIAN        Additional Comment:    _______________________________________________ Haig ProphetMorgan, Sharell Hilmer G, LCSW 10/04/2015, 1:10 PM

## 2015-10-05 DIAGNOSIS — N183 Chronic kidney disease, stage 3 (moderate): Secondary | ICD-10-CM | POA: Diagnosis not present

## 2015-10-05 DIAGNOSIS — I10 Essential (primary) hypertension: Secondary | ICD-10-CM | POA: Diagnosis not present

## 2015-10-05 DIAGNOSIS — N390 Urinary tract infection, site not specified: Secondary | ICD-10-CM | POA: Diagnosis not present

## 2015-10-05 DIAGNOSIS — M810 Age-related osteoporosis without current pathological fracture: Secondary | ICD-10-CM | POA: Diagnosis not present

## 2015-10-05 DIAGNOSIS — F0390 Unspecified dementia without behavioral disturbance: Secondary | ICD-10-CM | POA: Diagnosis not present

## 2015-10-05 DIAGNOSIS — F329 Major depressive disorder, single episode, unspecified: Secondary | ICD-10-CM | POA: Diagnosis not present

## 2015-10-05 DIAGNOSIS — J449 Chronic obstructive pulmonary disease, unspecified: Secondary | ICD-10-CM | POA: Diagnosis not present

## 2015-10-14 DIAGNOSIS — M25551 Pain in right hip: Secondary | ICD-10-CM | POA: Diagnosis not present

## 2015-10-20 DIAGNOSIS — B9620 Unspecified Escherichia coli [E. coli] as the cause of diseases classified elsewhere: Secondary | ICD-10-CM | POA: Diagnosis not present

## 2015-10-20 DIAGNOSIS — K219 Gastro-esophageal reflux disease without esophagitis: Secondary | ICD-10-CM | POA: Diagnosis not present

## 2015-10-20 DIAGNOSIS — M1990 Unspecified osteoarthritis, unspecified site: Secondary | ICD-10-CM | POA: Diagnosis not present

## 2015-10-20 DIAGNOSIS — N390 Urinary tract infection, site not specified: Secondary | ICD-10-CM | POA: Diagnosis not present

## 2015-10-20 DIAGNOSIS — I509 Heart failure, unspecified: Secondary | ICD-10-CM | POA: Diagnosis not present

## 2015-10-20 DIAGNOSIS — F0390 Unspecified dementia without behavioral disturbance: Secondary | ICD-10-CM | POA: Diagnosis not present

## 2015-10-20 DIAGNOSIS — F329 Major depressive disorder, single episode, unspecified: Secondary | ICD-10-CM | POA: Diagnosis not present

## 2015-10-20 DIAGNOSIS — I129 Hypertensive chronic kidney disease with stage 1 through stage 4 chronic kidney disease, or unspecified chronic kidney disease: Secondary | ICD-10-CM | POA: Diagnosis not present

## 2015-10-20 DIAGNOSIS — W19XXXD Unspecified fall, subsequent encounter: Secondary | ICD-10-CM | POA: Diagnosis not present

## 2015-10-20 DIAGNOSIS — N183 Chronic kidney disease, stage 3 (moderate): Secondary | ICD-10-CM | POA: Diagnosis not present

## 2015-10-20 DIAGNOSIS — E785 Hyperlipidemia, unspecified: Secondary | ICD-10-CM | POA: Diagnosis not present

## 2015-10-20 DIAGNOSIS — D696 Thrombocytopenia, unspecified: Secondary | ICD-10-CM | POA: Diagnosis not present

## 2015-10-20 DIAGNOSIS — I2510 Atherosclerotic heart disease of native coronary artery without angina pectoris: Secondary | ICD-10-CM | POA: Diagnosis not present

## 2015-10-20 DIAGNOSIS — Z22322 Carrier or suspected carrier of Methicillin resistant Staphylococcus aureus: Secondary | ICD-10-CM | POA: Diagnosis not present

## 2015-10-20 DIAGNOSIS — J449 Chronic obstructive pulmonary disease, unspecified: Secondary | ICD-10-CM | POA: Diagnosis not present

## 2015-10-20 DIAGNOSIS — Z7982 Long term (current) use of aspirin: Secondary | ICD-10-CM | POA: Diagnosis not present

## 2015-10-20 DIAGNOSIS — S72141D Displaced intertrochanteric fracture of right femur, subsequent encounter for closed fracture with routine healing: Secondary | ICD-10-CM | POA: Diagnosis not present

## 2015-10-20 DIAGNOSIS — I252 Old myocardial infarction: Secondary | ICD-10-CM | POA: Diagnosis not present

## 2015-10-20 DIAGNOSIS — N814 Uterovaginal prolapse, unspecified: Secondary | ICD-10-CM | POA: Diagnosis not present

## 2015-11-03 DIAGNOSIS — M6281 Muscle weakness (generalized): Secondary | ICD-10-CM | POA: Diagnosis not present

## 2015-11-03 DIAGNOSIS — I509 Heart failure, unspecified: Secondary | ICD-10-CM | POA: Diagnosis not present

## 2015-11-03 DIAGNOSIS — S72141D Displaced intertrochanteric fracture of right femur, subsequent encounter for closed fracture with routine healing: Secondary | ICD-10-CM | POA: Diagnosis not present

## 2015-11-03 DIAGNOSIS — J449 Chronic obstructive pulmonary disease, unspecified: Secondary | ICD-10-CM | POA: Diagnosis not present

## 2015-11-03 DIAGNOSIS — I2510 Atherosclerotic heart disease of native coronary artery without angina pectoris: Secondary | ICD-10-CM | POA: Diagnosis not present

## 2015-11-03 DIAGNOSIS — I252 Old myocardial infarction: Secondary | ICD-10-CM | POA: Diagnosis not present

## 2015-11-03 DIAGNOSIS — I130 Hypertensive heart and chronic kidney disease with heart failure and stage 1 through stage 4 chronic kidney disease, or unspecified chronic kidney disease: Secondary | ICD-10-CM | POA: Diagnosis not present

## 2015-11-03 DIAGNOSIS — N183 Chronic kidney disease, stage 3 (moderate): Secondary | ICD-10-CM | POA: Diagnosis not present

## 2015-11-03 DIAGNOSIS — F0390 Unspecified dementia without behavioral disturbance: Secondary | ICD-10-CM | POA: Diagnosis not present

## 2015-11-03 DIAGNOSIS — F329 Major depressive disorder, single episode, unspecified: Secondary | ICD-10-CM | POA: Diagnosis not present

## 2015-11-10 DIAGNOSIS — I2510 Atherosclerotic heart disease of native coronary artery without angina pectoris: Secondary | ICD-10-CM | POA: Diagnosis not present

## 2015-11-10 DIAGNOSIS — I252 Old myocardial infarction: Secondary | ICD-10-CM | POA: Diagnosis not present

## 2015-11-10 DIAGNOSIS — J449 Chronic obstructive pulmonary disease, unspecified: Secondary | ICD-10-CM | POA: Diagnosis not present

## 2015-11-10 DIAGNOSIS — F0390 Unspecified dementia without behavioral disturbance: Secondary | ICD-10-CM | POA: Diagnosis not present

## 2015-11-10 DIAGNOSIS — N183 Chronic kidney disease, stage 3 (moderate): Secondary | ICD-10-CM | POA: Diagnosis not present

## 2015-11-10 DIAGNOSIS — F329 Major depressive disorder, single episode, unspecified: Secondary | ICD-10-CM | POA: Diagnosis not present

## 2015-11-10 DIAGNOSIS — I509 Heart failure, unspecified: Secondary | ICD-10-CM | POA: Diagnosis not present

## 2015-11-10 DIAGNOSIS — M6281 Muscle weakness (generalized): Secondary | ICD-10-CM | POA: Diagnosis not present

## 2015-11-10 DIAGNOSIS — I130 Hypertensive heart and chronic kidney disease with heart failure and stage 1 through stage 4 chronic kidney disease, or unspecified chronic kidney disease: Secondary | ICD-10-CM | POA: Diagnosis not present

## 2015-11-10 DIAGNOSIS — S72141D Displaced intertrochanteric fracture of right femur, subsequent encounter for closed fracture with routine healing: Secondary | ICD-10-CM | POA: Diagnosis not present

## 2015-11-11 ENCOUNTER — Encounter: Payer: Self-pay | Admitting: Emergency Medicine

## 2015-11-11 ENCOUNTER — Other Ambulatory Visit: Payer: Self-pay

## 2015-11-11 ENCOUNTER — Observation Stay
Admission: EM | Admit: 2015-11-11 | Discharge: 2015-11-12 | Disposition: A | Discharge status: Home / Self Care | Payer: PPO | Attending: Internal Medicine | Admitting: Internal Medicine

## 2015-11-11 DIAGNOSIS — Z87440 Personal history of urinary (tract) infections: Secondary | ICD-10-CM | POA: Insufficient documentation

## 2015-11-11 DIAGNOSIS — Z79899 Other long term (current) drug therapy: Secondary | ICD-10-CM | POA: Insufficient documentation

## 2015-11-11 DIAGNOSIS — F329 Major depressive disorder, single episode, unspecified: Secondary | ICD-10-CM | POA: Diagnosis not present

## 2015-11-11 DIAGNOSIS — Z7982 Long term (current) use of aspirin: Secondary | ICD-10-CM | POA: Diagnosis not present

## 2015-11-11 DIAGNOSIS — J449 Chronic obstructive pulmonary disease, unspecified: Secondary | ICD-10-CM

## 2015-11-11 DIAGNOSIS — Z66 Do not resuscitate: Secondary | ICD-10-CM | POA: Diagnosis not present

## 2015-11-11 DIAGNOSIS — I959 Hypotension, unspecified: Secondary | ICD-10-CM | POA: Diagnosis not present

## 2015-11-11 DIAGNOSIS — E876 Hypokalemia: Secondary | ICD-10-CM | POA: Diagnosis not present

## 2015-11-11 DIAGNOSIS — K644 Residual hemorrhoidal skin tags: Secondary | ICD-10-CM | POA: Insufficient documentation

## 2015-11-11 DIAGNOSIS — I252 Old myocardial infarction: Secondary | ICD-10-CM | POA: Diagnosis not present

## 2015-11-11 DIAGNOSIS — Z8673 Personal history of transient ischemic attack (TIA), and cerebral infarction without residual deficits: Secondary | ICD-10-CM | POA: Diagnosis not present

## 2015-11-11 DIAGNOSIS — N390 Urinary tract infection, site not specified: Secondary | ICD-10-CM | POA: Diagnosis not present

## 2015-11-11 DIAGNOSIS — Z87891 Personal history of nicotine dependence: Secondary | ICD-10-CM | POA: Diagnosis not present

## 2015-11-11 DIAGNOSIS — E785 Hyperlipidemia, unspecified: Secondary | ICD-10-CM | POA: Diagnosis not present

## 2015-11-11 DIAGNOSIS — K5900 Constipation, unspecified: Secondary | ICD-10-CM | POA: Diagnosis not present

## 2015-11-11 DIAGNOSIS — M1990 Unspecified osteoarthritis, unspecified site: Secondary | ICD-10-CM | POA: Insufficient documentation

## 2015-11-11 DIAGNOSIS — D649 Anemia, unspecified: Secondary | ICD-10-CM | POA: Diagnosis not present

## 2015-11-11 DIAGNOSIS — Z7951 Long term (current) use of inhaled steroids: Secondary | ICD-10-CM | POA: Insufficient documentation

## 2015-11-11 DIAGNOSIS — F0390 Unspecified dementia without behavioral disturbance: Secondary | ICD-10-CM | POA: Diagnosis not present

## 2015-11-11 DIAGNOSIS — K625 Hemorrhage of anus and rectum: Secondary | ICD-10-CM | POA: Diagnosis not present

## 2015-11-11 DIAGNOSIS — K219 Gastro-esophageal reflux disease without esophagitis: Secondary | ICD-10-CM | POA: Diagnosis not present

## 2015-11-11 DIAGNOSIS — I10 Essential (primary) hypertension: Secondary | ICD-10-CM | POA: Diagnosis present

## 2015-11-11 LAB — CBC
HEMATOCRIT: 32.5 % — AB (ref 35.0–47.0)
HEMOGLOBIN: 10.8 g/dL — AB (ref 12.0–16.0)
MCH: 30.5 pg (ref 26.0–34.0)
MCHC: 33.2 g/dL (ref 32.0–36.0)
MCV: 91.8 fL (ref 80.0–100.0)
PLATELETS: 233 10*3/uL (ref 150–440)
RBC: 3.54 MIL/uL — AB (ref 3.80–5.20)
RDW: 15.1 % — AB (ref 11.5–14.5)
WBC: 7.5 10*3/uL (ref 3.6–11.0)

## 2015-11-11 LAB — PROTIME-INR
INR: 1.2
Prothrombin Time: 15.4 seconds — ABNORMAL HIGH (ref 11.4–15.0)

## 2015-11-11 LAB — CBC WITH DIFFERENTIAL/PLATELET
Basophils Absolute: 0.1 10*3/uL (ref 0–0.1)
Basophils Relative: 1 %
Eosinophils Absolute: 0.3 10*3/uL (ref 0–0.7)
Eosinophils Relative: 4 %
HEMATOCRIT: 34.3 % — AB (ref 35.0–47.0)
HEMOGLOBIN: 11.4 g/dL — AB (ref 12.0–16.0)
LYMPHS ABS: 1.5 10*3/uL (ref 1.0–3.6)
LYMPHS PCT: 21 %
MCH: 30.7 pg (ref 26.0–34.0)
MCHC: 33.2 g/dL (ref 32.0–36.0)
MCV: 92.6 fL (ref 80.0–100.0)
MONOS PCT: 11 %
Monocytes Absolute: 0.8 10*3/uL (ref 0.2–0.9)
NEUTROS PCT: 63 %
Neutro Abs: 4.6 10*3/uL (ref 1.4–6.5)
Platelets: 253 10*3/uL (ref 150–440)
RBC: 3.7 MIL/uL — AB (ref 3.80–5.20)
RDW: 15.1 % — ABNORMAL HIGH (ref 11.5–14.5)
WBC: 7.2 10*3/uL (ref 3.6–11.0)

## 2015-11-11 LAB — COMPREHENSIVE METABOLIC PANEL
ALT: 8 U/L — AB (ref 14–54)
AST: 16 U/L (ref 15–41)
Albumin: 3.2 g/dL — ABNORMAL LOW (ref 3.5–5.0)
Alkaline Phosphatase: 87 U/L (ref 38–126)
Anion gap: 9 (ref 5–15)
BUN: 13 mg/dL (ref 6–20)
CHLORIDE: 99 mmol/L — AB (ref 101–111)
CO2: 29 mmol/L (ref 22–32)
CREATININE: 1.02 mg/dL — AB (ref 0.44–1.00)
Calcium: 8.4 mg/dL — ABNORMAL LOW (ref 8.9–10.3)
GFR, EST AFRICAN AMERICAN: 54 mL/min — AB (ref >60)
GFR, EST NON AFRICAN AMERICAN: 47 mL/min — AB (ref >60)
Glucose, Bld: 129 mg/dL — ABNORMAL HIGH (ref 65–99)
POTASSIUM: 3.3 mmol/L — AB (ref 3.5–5.1)
SODIUM: 137 mmol/L (ref 135–145)
Total Bilirubin: 0.6 mg/dL (ref 0.3–1.2)
Total Protein: 6.5 g/dL (ref 6.5–8.1)

## 2015-11-11 LAB — TYPE AND SCREEN
ABO/RH(D): A POS
ANTIBODY SCREEN: NEGATIVE

## 2015-11-11 MED ORDER — ACETAMINOPHEN 650 MG RE SUPP
650.0000 mg | Freq: Four times a day (QID) | RECTAL | Status: DC | PRN
Start: 2015-11-11 — End: 2015-11-12

## 2015-11-11 MED ORDER — FAMOTIDINE IN NACL 20-0.9 MG/50ML-% IV SOLN
20.0000 mg | Freq: Every day | INTRAVENOUS | Status: DC
Start: 2015-11-11 — End: 2015-11-12
  Administered 2015-11-11: 20 mg via INTRAVENOUS
  Filled 2015-11-11 (×2): qty 50

## 2015-11-11 MED ORDER — CITALOPRAM HYDROBROMIDE 20 MG PO TABS
30.0000 mg | ORAL_TABLET | Freq: Every day | ORAL | Status: DC
  Administered 2015-11-12: 09:00:00 30 mg via ORAL
  Filled 2015-11-11: qty 2

## 2015-11-11 MED ORDER — ACETAMINOPHEN 325 MG PO TABS: 650.0000 mg | ORAL_TABLET | Freq: Four times a day (QID) | ORAL | Status: DC | PRN

## 2015-11-11 MED ORDER — TIOTROPIUM BROMIDE MONOHYDRATE 18 MCG IN CAPS
18.0000 ug | ORAL_CAPSULE | Freq: Every day | RESPIRATORY_TRACT | Status: DC
  Administered 2015-11-12: 18 ug via RESPIRATORY_TRACT
  Filled 2015-11-11: qty 5

## 2015-11-11 MED ORDER — MOMETASONE FURO-FORMOTEROL FUM 200-5 MCG/ACT IN AERO
2.0000 | INHALATION_SPRAY | Freq: Two times a day (BID) | RESPIRATORY_TRACT | Status: DC
  Administered 2015-11-12: 09:00:00 2 via RESPIRATORY_TRACT
  Filled 2015-11-11: qty 8.8

## 2015-11-11 MED ORDER — CARVEDILOL 12.5 MG PO TABS
12.5000 mg | ORAL_TABLET | Freq: Two times a day (BID) | ORAL | Status: DC
  Administered 2015-11-12: 12.5 mg via ORAL
  Filled 2015-11-11: qty 1

## 2015-11-11 MED ORDER — POTASSIUM CHLORIDE IN NACL 20-0.45 MEQ/L-% IV SOLN
INTRAVENOUS | Status: DC
  Administered 2015-11-11: via INTRAVENOUS
  Filled 2015-11-11 (×2): qty 1000

## 2015-11-11 NOTE — H&P (Signed)
PCP:   BABAOFF, Lavada MesiMARC E, MD   Chief Complaint:  Rectal bleeding  HPI: This is a 82 year old female resident of an assisted living center who today at approximately 6 PM had a single episode of bright red blood per rectum. This had clots. The family was informed and she was seen to the ER. The patient has not had a recurrence of blood per rectum. She has no history of colonoscopy. She has no history of rectal bleeding. She does have a history of constipation and is on stool softeners. The patient has no history of GERD. The patient is severely demented on the unable to give any history. History provided by her daughters were present at bedside. Patient discussed with the family as well as ER physician. Admission requested based on patient's age.  Patient's daughter is her power of attorney [name] Felts [phone] cell, [phone] home  Review of Systems:  Unable to obtain due to patient's dementia  Past Medical History: Past Medical History  Diagnosis Date  . Other and unspecified hyperlipidemia 06/05/2013  . Essential hypertension, benign 06/05/2013  . Depression 06/05/2013  . GERD (gastroesophageal reflux disease) 06/05/2013  . Recurrent UTI   . Paronychia, toe   . Anemia   . Acute renal failure (HCC)   . Cerebral artery occlusion (HCC)     with cerebral infarction  . Dementia   . Asthma   . Edema   . Osteoarthritis   . Stroke (HCC)   . Heart attack [facility](HCC)    Past Surgical History  Procedure Laterality Date  . Knee surgery Right   . Cardiac surgery      Left BBB  . Femur im nail Right 10/01/2015    Procedure: INTRAMEDULLARY (IM) NAIL FEMORAL;  Surgeon: Juanell FairlyKevin Krasinski, MD;  Location: [facility] ORS;  Service: Orthopedics;  Laterality: Right;    Medications: Prior to Admission medications   Medication Sig Start Date End Date Taking? Authorizing Provider  aspirin 325 MG tablet Take 1 tablet (325 mg total) by mouth daily. 06/09/13  Yes Rodolph Bonganiel V Thompson, MD  carvedilol (COREG) 12.5  MG tablet Take 1 tablet (12.5 mg total) by mouth 2 (two) times daily with a meal. 06/09/13  Yes Rodolph Bonganiel V Thompson, MD  citalopram (CELEXA) 20 MG tablet Take 30 mg by mouth daily.    Yes Historical Provider, MD  Fluticasone-Salmeterol (ADVAIR) 250-50 MCG/DOSE AEPB Inhale 1 puff into the lungs 2 (two) times daily.   Yes Historical Provider, MD  furosemide (LASIX) 40 MG tablet Take 40 mg by mouth daily.   Yes Historical Provider, MD  nystatin (NYSTATIN) powder Apply 1 g topically 2 (two) times daily as needed (for irritation).    Yes Historical Provider, MD  omeprazole (PRILOSEC) 20 MG capsule Take 20 mg by mouth daily before breakfast.    Yes Historical Provider, MD  senna-docusate (SENOKOT-S) 8.6-50 MG tablet Take 1 tablet by mouth daily.    Yes Historical Provider, MD  simvastatin (ZOCOR) 20 MG tablet Take 20 mg by mouth daily.    Yes Historical Provider, MD  tiotropium (SPIRIVA) 18 MCG inhalation capsule Place 18 mcg into inhaler and inhale daily.   Yes Historical Provider, MD  vitamin B-12 (CYANOCOBALAMIN) 1000 MCG tablet Take 1,000 mcg by mouth daily.   Yes Historical Provider, MD    Allergies:   Allergies  Allergen Reactions  . Aricept [Donepezil Hcl] Other (See Comments)    Reaction:  Nightmares   . Codeine Other (See Comments)    Reaction:  Unknown   . Ketek [Telithromycin] Other (See Comments)    Reaction:  Mood changes     Social History:  reports that she has quit smoking. She has never used smokeless tobacco. She reports that she drinks alcohol. She reports that she does not use illicit drugs.  Family History: Family History  Problem Relation Age of Onset  . Prostate cancer Son   . Kidney disease Neg Hx   . Bladder Cancer Neg Hx   . Cancer Mother     Oral  . Colon cancer Sister     Physical Exam: Filed Vitals:   11/11/15 1853  BP: 166/83  Pulse: 71  Temp: 98.1 F (36.7 C)  TempSrc: Oral  Resp: 18  Height: 5' (1.524 m)  Weight: 84.596 kg (186 lb 8 oz)  SpO2:  96%    General:  Alert and oriented times three, well developed and nourished, no acute distress Eyes: PERRLA, pink conjunctiva, no scleral icterus ENT: Moist oral mucosa, neck supple, no thyromegaly Lungs: clear to ascultation, no wheeze, no crackles, no use of accessory muscles Cardiovascular: regular rate and rhythm, no regurgitation, no gallops, no murmurs. No carotid bruits, no JVD Abdomen: soft, positive BS, non-tender, non-distended, no organomegaly, not an acute abdomen Rectal: external hemmrhoids Neuro: CN II - XII grossly intact, sensation intact Musculoskeletal: strength 5/5 all extremities, no clubbing, cyanosis or edema Skin: no rash, no subcutaneous crepitation, no decubitus Psych: appropriate patient   Labs on Admission:   Recent Labs  11/11/15 1900  NA 137  K 3.3*  CL 99*  CO2 29  GLUCOSE 129*  BUN 13  CREATININE 1.02*  CALCIUM 8.4*    Recent Labs  11/11/15 1900  AST 16  ALT 8*  ALKPHOS 87  BILITOT 0.6  PROT 6.5  ALBUMIN 3.2*   No results for input(s): LIPASE, AMYLASE in the last 72 hours.  Recent Labs  11/11/15 1900  WBC 7.2  NEUTROABS 4.6  HGB 11.4*  HCT 34.3*  MCV 92.6  PLT 253   No results for input(s): CKTOTAL, CKMB, CKMBINDEX, TROPONINI in the last 72 hours. Invalid input(s): POCBNP No results for input(s): DDIMER in the last 72 hours. No results for input(s): HGBA1C in the last 72 hours. No results for input(s): CHOL, HDL, LDLCALC, TRIG, CHOLHDL, LDLDIRECT in the last 72 hours. No results for input(s): TSH, T4TOTAL, T3FREE, THYROIDAB in the last 72 hours.  Invalid input(s): FREET3 No results for input(s): VITAMINB12, FOLATE, FERRITIN, TIBC, IRON, RETICCTPCT in the last 72 hours.  Micro Results: No results found for this or any previous visit (from the past 240 hour(s)).   Radiological Exams on Admission: No results found.  Assessment/Plan Present on Admission:  . Rectal bleed -bring in for 23 hour observation -h/h every  8 hours -protonix IV -gentle IV fluids -GI not consulted as single episode of bleed in 82 y/o female with dementia. Discussed with daughters pro's and cons of interevention given patients age and dementia. As This is a single episode they are agreeable to just watching overnight -patient with external hemorrhoids and h/o constipation. Possible hemorrhoidal versus diverticula bleed  Mild hypokalemia -Replete in IV fluids  . Essential hypertension, benign -stable, resume home meds -When necessary blood pressure medications ordered  Dyslipidemia -Statin held.   . Recurrent UTI -stable, aware   Damany Eastman 11/11/2015, 8:46 PM

## 2015-11-11 NOTE — ED Provider Notes (Signed)
[facility] Emergency Department Provider Note  ____________________________________________   I have reviewed the triage vital signs and the nursing notes.   HISTORY  Chief Complaint Rectal Bleeding    HPI Daisy [name] is a 82 y.o. female who is on aspirin but no other blood thinners presents today passing bright red blood per rectum since around 5 PM this evening. Has had 1 or 2 episodes. She has no pain, no other symptoms and no complaints. No history of prior similar.She did pass a clot which was brought in in a sample bottle. Patient is at a nursing home. She is not on an Lovenox anymore according to notes.       Past Medical History  Diagnosis Date  . Other and unspecified hyperlipidemia 06/05/2013  . Essential hypertension, benign 06/05/2013  . Depression 06/05/2013  . GERD (gastroesophageal reflux disease) 06/05/2013  . Recurrent UTI   . Paronychia, toe   . Anemia   . Acute renal failure (HCC)   . Cerebral artery occlusion (HCC)     with cerebral infarction  . Dementia   . Asthma   . Edema   . Osteoarthritis   . Stroke (HCC)   . Heart attack [facility](HCC)     Patient Active Problem List   Diagnosis Date Noted  . Infection of urinary tract 10/04/2015  . Escherichia coli (E. coli) infection 10/04/2015  . CKD (chronic kidney disease), stage III 10/04/2015  . Hyperglycemia 10/04/2015  . MRSA carrier 10/04/2015  . Leukocytosis 10/04/2015  . Thrombocytopenia (HCC) 10/04/2015  . Closed right hip fracture (HCC) 09/30/2015  . Recurrent UTI 11/12/2014  . Microscopic hematuria 11/12/2014  . Atrophic vaginitis 11/12/2014  . CVA (cerebral infarction) 06/05/2013  . Hypotension, unspecified 06/05/2013  . Other and unspecified hyperlipidemia 06/05/2013  . Acute renal failure (HCC) 06/05/2013  . Leukocytosis, unspecified 06/05/2013  . Essential hypertension, benign 06/05/2013  . Depression 06/05/2013  . GERD (gastroesophageal reflux disease)  06/05/2013    Past Surgical History  Procedure Laterality Date  . Knee surgery Right   . Cardiac surgery      Left BBB  . Femur im nail Right 10/01/2015    Procedure: INTRAMEDULLARY (IM) NAIL FEMORAL;  Surgeon: Juanell FairlyKevin Krasinski, MD;  Location: [facility] ORS;  Service: Orthopedics;  Laterality: Right;    Current Outpatient Rx  Name  Route  Sig  Dispense  Refill  . acetaminophen (TYLENOL) 325 MG tablet   Oral   Take 2 tablets (650 mg total) by mouth every 6 (six) hours as needed for mild pain (or Fever >/= 101).   100 tablet   0   . aspirin 325 MG tablet   Oral   Take 1 tablet (325 mg total) by mouth daily.         . bisacodyl (DULCOLAX) 10 MG suppository   Rectal   Place 1 suppository (10 mg total) rectally daily as needed for moderate constipation.   12 suppository   0   . carvedilol (COREG) 12.5 MG tablet   Oral   Take 1 tablet (12.5 mg total) by mouth 2 (two) times daily with a meal.   62 tablet   0   . cephALEXin (KEFLEX) 250 MG/5ML suspension   Oral   Take 5 mLs (250 mg total) by mouth every 8 (eight) hours.   100 mL   0   . citalopram (CELEXA) 20 MG tablet   Oral   Take 40 mg by mouth daily.         .Marland Kitchen  Cyanocobalamin (RA VITAMIN B-12 TR) 1000 MCG TBCR   Oral   Take 1 tablet by mouth daily.          Marland Kitchen. docusate sodium (COLACE) 100 MG capsule   Oral   Take 1 capsule (100 mg total) by mouth 2 (two) times daily.   10 capsule   0   . enoxaparin (LOVENOX) 40 MG/0.4ML injection   Subcutaneous   Inject 0.4 mLs (40 mg total) into the skin daily.   14 Syringe   0   . ferrous sulfate 325 (65 FE) MG tablet   Oral   Take 1 tablet (325 mg total) by mouth 3 (three) times daily after meals.   90 tablet   3   . Fluticasone-Salmeterol (ADVAIR) 250-50 MCG/DOSE AEPB   Inhalation   Inhale 1 puff into the lungs 2 (two) times daily.         . furosemide (LASIX) 40 MG tablet   Oral   Take 40 mg by mouth daily.         Marland Kitchen. nystatin (NYSTATIN) powder   Topical    Apply topically 2 (two) times daily.         Marland Kitchen. omeprazole (PRILOSEC) 20 MG capsule   Oral   Take 20 mg by mouth daily.         Marland Kitchen. oxyCODONE (OXY IR/ROXICODONE) 5 MG immediate release tablet   Oral   Take 1 tablet (5 mg total) by mouth every 4 (four) hours as needed for breakthrough pain ((for MODERATE breakthrough pain)).   30 tablet   0   . polyethylene glycol (MIRALAX / GLYCOLAX) packet   Oral   Take 17 g by mouth daily as needed for mild constipation.   14 each   0   . senna (SENOKOT) 8.6 MG TABS tablet   Oral   Take 1 tablet by mouth at bedtime.         . senna (SENOKOT) 8.6 MG TABS tablet   Oral   Take 1 tablet (8.6 mg total) by mouth 2 (two) times daily.   120 each   0   . simvastatin (ZOCOR) 20 MG tablet   Oral   Take 20 mg by mouth every evening.          . tiotropium (SPIRIVA) 18 MCG inhalation capsule   Inhalation   Place 18 mcg into inhaler and inhale daily.         Marland Kitchen. tobramycin (TOBREX) 0.3 % ophthalmic solution   Both Eyes   Place 1 drop into both eyes 4 (four) times daily.           Allergies Aricept; Codeine; and Ketek  Family History  Problem Relation Age of Onset  . Prostate cancer Son   . Kidney disease Neg Hx   . Bladder Cancer Neg Hx   . Cancer Mother     Oral  . Colon cancer Sister     Social History Social History  Substance Use Topics  . Smoking status: Former Games developermoker  . Smokeless tobacco: Never Used     Comment: quit 50 + years  . Alcohol Use: 0.0 oz/week    0 Standard drinks or equivalent per week     Comment: occasionally    Review of Systems }Constitutional: No fever/chills Eyes: No visual changes. ENT: No sore throat. No stiff neck no neck pain Cardiovascular: Denies chest pain. Respiratory: Denies shortness of breath. Gastrointestinal:   no vomiting.  No diarrhea.  No constipation.  Genitourinary: Negative for dysuria. Musculoskeletal: Negative lower extremity swelling Skin: Negative for  rash. Neurological: Negative for headaches, focal weakness or numbness. 10-point ROS otherwise negative.  ____________________________________________   PHYSICAL EXAM:  VITAL SIGNS: ED Triage Vitals  Enc Vitals Group     BP 11/11/15 1853 166/83 mmHg     Pulse Rate 11/11/15 1853 71     Resp 11/11/15 1853 18     Temp 11/11/15 1853 98.1 F (36.7 C)     Temp Source 11/11/15 1853 Oral     SpO2 11/11/15 1853 96 %     Weight 11/11/15 1853 186 lb 8 oz (84.596 kg)     Height 11/11/15 1853 5' (1.524 m)     Head Cir --      Peak Flow --      Pain Score --      Pain Loc --      Pain Edu? --      Excl. in GC? --     Constitutional: Alert and oriented At baseline, unsure of date. Well appearing and in no acute distress. Eyes: Conjunctivae are normal. PERRL. EOMI. Mouth/Throat: Mucous membranes are moist.  Oropharynx non-erythematous. Cardiovascular: Normal rate, regular rhythm. Grossly normal heart sounds.  Good peripheral circulation. Respiratory: Normal respiratory effort.  No retractions. Lungs CTAB. Abdominal: Soft and nontender. No distention. No guarding no rebound Back:  There is no focal tenderness or step off there is no midline tenderness there are no lesions noted. there is no CVA tenderness Rectal exam: Female chaperone present, frank blood noted inside the vault only. No hemorrhoids noted. Musculoskeletal: No lower extremity tenderness. No joint effusions, no DVT signs strong distal pulses no edema Neurologic:  Normal speech and language. No gross focal neurologic deficits are appreciated.  Skin:  Skin is warm, dry and intact. No rash noted. Psychiatric: Mood and affect are normal. Speech and behavior are normal.  ____________________________________________   LABS (all labs ordered are listed, but only abnormal results are displayed)  Labs Reviewed  COMPREHENSIVE METABOLIC PANEL  CBC WITH DIFFERENTIAL/PLATELET  PROTIME-INR  TYPE AND SCREEN    ____________________________________________  EKG  I personally interpreted any EKGs ordered by me or triage  ____________________________________________  RADIOLOGY  I reviewed any imaging ordered by me or triage that were performed during my shift and, if possible, patient and/or family made aware of any abnormal findings. ____________________________________________   PROCEDURES  Procedure(s) performed: None  Critical Care performed: None  ____________________________________________   INITIAL IMPRESSION / ASSESSMENT AND PLAN / ED COURSE  Pertinent labs & imaging results that were available during my care of the patient were reviewed by me and considered in my medical decision making (see chart for details).  Patient with bright red blood per rectum, vital signs are reassuring blood work is pending however on exam she has frank blood in her rectal vault. At her age likely will need to be admitted for observation. ____________________________________________   FINAL CLINICAL IMPRESSION(S) / ED DIAGNOSES  Final diagnoses:  None      This chart was dictated using voice recognition software.  Despite best efforts to proofread,  errors can occur which can change meaning.     Jeanmarie Plant, MD 11/11/15 [phone]

## 2015-11-11 NOTE — ED Notes (Signed)
MD at bedside.  Hemoccult performed and positive for rectal bleeding.

## 2015-11-11 NOTE — ED Notes (Signed)
Pt comes into the ED via EMS from [facility] c/o passing 1 large blood clot from her rectum earlier today.  Denies any rectal pain, abdominal pain, shortness of breath. H/o GERD but no other GI problems in the past.  Patient is not currently on any blood thinners other than asp 325.  Patient in NAD at this time with even and unlabored respirations.

## 2015-11-12 DIAGNOSIS — I10 Essential (primary) hypertension: Secondary | ICD-10-CM | POA: Diagnosis not present

## 2015-11-12 DIAGNOSIS — K625 Hemorrhage of anus and rectum: Secondary | ICD-10-CM | POA: Diagnosis not present

## 2015-11-12 DIAGNOSIS — R6889 Other general symptoms and signs: Secondary | ICD-10-CM | POA: Diagnosis not present

## 2015-11-12 DIAGNOSIS — F0390 Unspecified dementia without behavioral disturbance: Secondary | ICD-10-CM | POA: Diagnosis not present

## 2015-11-12 DIAGNOSIS — Z7401 Bed confinement status: Secondary | ICD-10-CM | POA: Diagnosis not present

## 2015-11-12 DIAGNOSIS — K219 Gastro-esophageal reflux disease without esophagitis: Secondary | ICD-10-CM | POA: Diagnosis not present

## 2015-11-12 LAB — BASIC METABOLIC PANEL
ANION GAP: 6 (ref 5–15)
BUN: 11 mg/dL (ref 6–20)
CALCIUM: 8.3 mg/dL — AB (ref 8.9–10.3)
CO2: 29 mmol/L (ref 22–32)
Chloride: 103 mmol/L (ref 101–111)
Creatinine, Ser: 0.93 mg/dL (ref 0.44–1.00)
GFR, EST NON AFRICAN AMERICAN: 52 mL/min — AB (ref >60)
GLUCOSE: 101 mg/dL — AB (ref 65–99)
Potassium: 3.6 mmol/L (ref 3.5–5.1)
SODIUM: 138 mmol/L (ref 135–145)

## 2015-11-12 LAB — CBC
HCT: 32.9 % — ABNORMAL LOW (ref 35.0–47.0)
Hemoglobin: 11.2 g/dL — ABNORMAL LOW (ref 12.0–16.0)
MCH: 30.7 pg (ref 26.0–34.0)
MCHC: 33.9 g/dL (ref 32.0–36.0)
MCV: 90.4 fL (ref 80.0–100.0)
PLATELETS: 244 10*3/uL (ref 150–440)
RBC: 3.64 MIL/uL — ABNORMAL LOW (ref 3.80–5.20)
RDW: 14.9 % — AB (ref 11.5–14.5)
WBC: 7.2 10*3/uL (ref 3.6–11.0)

## 2015-11-12 LAB — MRSA PCR SCREENING: MRSA by PCR: NEGATIVE

## 2015-11-12 MED ORDER — POTASSIUM CHLORIDE 20 MEQ PO PACK
20.0000 meq | PACK | Freq: Once | ORAL | Status: AC
  Administered 2015-11-12: 20 meq via ORAL
  Filled 2015-11-12: qty 1

## 2015-11-12 MED ORDER — HYDRALAZINE HCL 20 MG/ML IJ SOLN
10.0000 mg | INTRAMUSCULAR | Status: DC | PRN
  Administered 2015-11-12: 06:00:00 10 mg via INTRAVENOUS
  Filled 2015-11-12: qty 1

## 2015-11-12 NOTE — Clinical Social Work Note (Signed)
Clinical Social Work Assessment  Patient Details  Name: Daisy [name] MRN: [id number] Date of Birth: [birth year]  Date of referral:  11/12/15               Reason for consult:  Facility Placement                Permission sought to share information with:   (patient with advanced dementia) Permission granted to share information::     Name::        Agency::     Relationship::     Contact Information:     Housing/Transportation Living arrangements for the past 2 months:  Assisted Living Facility Source of Information:  Adult Children Patient Interpreter Needed:  None Criminal Activity/Legal Involvement Pertinent to Current Situation/Hospitalization:  No - Comment as needed Significant Relationships:  Adult Children Lives with:    Do you feel safe going back to the place where you live?  Yes Need for family participation in patient care:  Yes (Comment)  Care giving concerns:  none   Social Worker assessment / plan:  Patient admitted under observation late yesterday. MD to discharge patient today to return to [facility]. CSW has spoken to Daisy [name] at [facility] and she is aware and nurse will call report due to it being a weekend. FL2 completed as well. No new meds. CSW contacted Daisy [name], patient's daughter: [phone]/cell: [phone] and she is aware of discharge and is going to arrange transportation back to [facility].  Employment status:  Retired Database administratornsurance information:  Managed Medicare PT Recommendations:  Not assessed at this time Information / Referral to community resources:     Patient/Family's Response to care:  Patient's daughter appreciative of CSW call.  Patient/Family's Understanding of and Emotional Response to Diagnosis, Current Treatment, and Prognosis:  Patient's daughter to attempt to arrange transport for her mother.  Emotional Assessment Appearance:  Appears stated age Attitude/Demeanor/Rapport:  Unable to Assess Affect (typically observed):  Unable  to Assess Orientation:    Alcohol / Substance use:  Not Applicable Psych involvement (Current and /or in the community):  No (Comment)  Discharge Needs  Concerns to be addressed:  Care Coordination Readmission within the last 30 days:  No Current discharge risk:  None Barriers to Discharge:  No Barriers Identified   Daisy SpanielMonica Ronnae Kaser, LCSW 11/12/2015, 12:06 PM

## 2015-11-12 NOTE — Progress Notes (Signed)
Iv dc. Vss. Report called to [place] at [facility]. EMS called for non emergency transport

## 2015-11-12 NOTE — Care Management Note (Addendum)
Case Management Note  Patient Details  Name: Franki CabotMolene T Flagler MRN: [id number] Date of Birth: 09-Jun-1921  Subjective/Objective:     [facility]-SW is coordinating Ms Soberanes's discharge back to [facility]. No CM needs. No home health services or DME ordered.                Action/Plan:   Expected Discharge Date:                  Expected Discharge Plan:     In-House Referral:     Discharge planning Services     Post Acute Care Choice:    Choice offered to:     DME Arranged:    DME Agency:     HH Arranged:    HH Agency:     Status of Service:     If discussed at [employer] Length of Stay Meetings, dates discussed:    Additional Comments:  Mandeep Kiser A, RN 11/12/2015, 3:08 PM

## 2015-11-12 NOTE — Clinical Social Work Note (Signed)
CSW received call from [facility] requesting if [name] could be transported via EMS because she stated that it took 4 staff members to get her out of the car on Friday and they did not want to injure any staff member's backs. Daisy [name] stated that if they had to, they would try to make other arrangements but preferred to come via EMS. Daisy [name] stated she had contacted daughter: Daisy [name] and updated her as well. CSW called Daisy [name] and discussed the EMS transport. CSW reiterated that there were no guarantees for insurance to cover EMS transport but that she did meet some of the criteria for transport under EMS. Ms. Daisy [name] stated that when she told her sister what Daisy [name] had said, Ms. Daisy [name] stated her sister was angry and stated that it did not take 4 of their staff members it only took 2 and they were able to transfer her with no concerns. I explained to Ms. Daisy [name] that [facility] stated that they could try to figure something out for transport if they were insistent upon them transferring her. Ms. Daisy [name] stated that she would take the chance that EMS would be covered and have Daisy [name] transport via EMS. York SpanielMonica Caliah Kopke MSW,LCSW [phone]

## 2015-11-12 NOTE — Discharge Summary (Signed)
[employer] [facility] [place] [facility]   PATIENT NAME: Daisy [name]    MR#:  [id number]  DATE OF BIRTH:  Apr 15, 1922  DATE OF ADMISSION:  11/11/2015 ADMITTING PHYSICIAN: Gery Pray, MD  DATE OF DISCHARGE: 11/12/2015  PRIMARY CARE PHYSICIAN: BABAOFF, MARC E, MD    ADMISSION DIAGNOSIS:  Rectal bleeding [K62.5]  DISCHARGE DIAGNOSIS:  Active Problems:   Hypotension, unspecified   Essential hypertension, benign   Recurrent UTI   Rectal bleed   Dementia   COPD (chronic obstructive pulmonary disease) (HCC)   Dyslipidemia   SECONDARY DIAGNOSIS:   Past Medical History  Diagnosis Date  . Other and unspecified hyperlipidemia 06/05/2013  . Essential hypertension, benign 06/05/2013  . Depression 06/05/2013  . GERD (gastroesophageal reflux disease) 06/05/2013  . Recurrent UTI   . Paronychia, toe   . Anemia   . Acute renal failure (HCC)   . Cerebral artery occlusion (HCC)     with cerebral infarction  . Dementia   . Asthma   . Edema   . Osteoarthritis   . Stroke (HCC)   . Heart attack [facility])     HOSPITAL COURSE:   1. Rectal bleeding. One episode. Hemoglobin stable. I will stop aspirin. Advance diet to solid food. If tolerates and no further bleeding can go back to her facility today. Avoid constipation. As per family bright red blood with some dark clots. 2. History of dementia without behavioral disturbance 3. Recent right hip fracture. Physical therapy at facility. 4. Essential hypertension continue usual medications 5. Gastroesophageal reflux disease without esophagitis on omeprazole  DISCHARGE CONDITIONS:    Satisfactory CONSULTS OBTAINED:  None   DRUG ALLERGIES:   Allergies  Allergen Reactions  . Aricept [Donepezil Hcl] Other (See Comments)    Reaction:  Nightmares   . Codeine Other (See Comments)    Reaction:  Unknown   . Ketek [Telithromycin] Other (See Comments)    Reaction:  Mood changes     DISCHARGE MEDICATIONS:   Current Discharge  Medication List    CONTINUE these medications which have NOT CHANGED   Details  carvedilol (COREG) 12.5 MG tablet Take 1 tablet (12.5 mg total) by mouth 2 (two) times daily with a meal. Qty: 62 tablet, Refills: 0    citalopram (CELEXA) 20 MG tablet Take 30 mg by mouth daily.     Fluticasone-Salmeterol (ADVAIR) 250-50 MCG/DOSE AEPB Inhale 1 puff into the lungs 2 (two) times daily.    furosemide (LASIX) 40 MG tablet Take 40 mg by mouth daily.    nystatin (NYSTATIN) powder Apply 1 g topically 2 (two) times daily as needed (for irritation).     omeprazole (PRILOSEC) 20 MG capsule Take 20 mg by mouth daily before breakfast.     senna-docusate (SENOKOT-S) 8.6-50 MG tablet Take 1 tablet by mouth daily.     simvastatin (ZOCOR) 20 MG tablet Take 20 mg by mouth daily.     tiotropium (SPIRIVA) 18 MCG inhalation capsule Place 18 mcg into inhaler and inhale daily.    vitamin B-12 (CYANOCOBALAMIN) 1000 MCG tablet Take 1,000 mcg by mouth daily.      STOP taking these medications     aspirin 325 MG tablet          DISCHARGE INSTRUCTIONS:   Follow-up PMD one week  If you experience worsening of your admission symptoms, develop shortness of breath, life threatening emergency, suicidal or homicidal thoughts you must seek medical attention immediately by calling 911 or calling your MD immediately  if symptoms less severe.  You Must read complete instructions/literature along with all the possible adverse reactions/side effects for all the Medicines you take and that have been prescribed to you. Take any new Medicines after you have completely understood and accept all the possible adverse reactions/side effects.   Please note  You were cared for by a hospitalist during your hospital stay. If you have any questions about your discharge medications or the care you received while you were in the hospital after you are discharged, you can call the unit and asked to speak with the hospitalist on  call if the hospitalist that took care of you is not available. Once you are discharged, your primary care physician will handle any further medical issues. Please note that NO REFILLS for any discharge medications will be authorized once you are discharged, as it is imperative that you return to your primary care physician (or establish a relationship with a primary care physician if you do not have one) for your aftercare needs so that they can reassess your need for medications and monitor your lab values.    Today   CHIEF COMPLAINT:   Chief Complaint  Patient presents with  . Rectal Bleeding    HISTORY OF PRESENT ILLNESS:  Daisy [name]  is a 82 y.o. female Presented with rectal bleeding.   VITAL SIGNS:  Blood pressure 157/63, pulse 80, temperature 98.2 F (36.8 C), temperature source Oral, resp. rate 17, height 5' (1.524 m), weight 84.596 kg (186 lb 8 oz), SpO2 94 %.    PHYSICAL EXAMINATION:  GENERAL:  82 y.o.-year-old patient lying in the bed with no acute distress.  EYES: Pupils equal, round, reactive to light and accommodation. No scleral icterus. Extraocular muscles intact.  HEENT: Head atraumatic, normocephalic. Oropharynx and nasopharynx clear.  NECK:  Supple, no jugular venous distention. No thyroid enlargement, no tenderness.  LUNGS: Normal breath sounds bilaterally, no wheezing, rales,rhonchi or crepitation. No use of accessory muscles of respiration.  CARDIOVASCULAR: S1, S2 normal. No murmurs, rubs, or gallops.  ABDOMEN: Soft, non-tender, non-distended. Bowel sounds present. No organomegaly or mass.  EXTREMITIES: Trace edema.  No cyanosis, or clubbing.  NEUROLOGIC: Cranial nerves II through XII are intact. Gait not checked.  PSYCHIATRIC: The patient is alert.  SKIN: No obvious rash, lesion, or ulcer.   DATA REVIEW:   CBC  Recent Labs Lab 11/12/15 0716  WBC 7.2  HGB 11.2*  HCT 32.9*  PLT 244    Chemistries   Recent Labs Lab 11/11/15 1900  11/12/15 0716  NA 137 138  K 3.3* 3.6  CL 99* 103  CO2 29 29  GLUCOSE 129* 101*  BUN 13 11  CREATININE 1.02* 0.93  CALCIUM 8.4* 8.3*  AST 16  --   ALT 8*  --   ALKPHOS 87  --   BILITOT 0.6  --      Microbiology Results  Results for orders placed or performed during the hospital encounter of 11/11/15  MRSA PCR Screening     Status: None   Collection Time: 11/11/15 11:09 PM  Result Value Ref Range Status   MRSA by PCR NEGATIVE NEGATIVE Final    Comment:        The GeneXpert MRSA Assay (FDA approved for NASAL specimens only), is one component of a comprehensive MRSA colonization surveillance program. It is not intended to diagnose MRSA infection nor to guide or monitor treatment for MRSA infections.     Management plans discussed with the patient, family  and they are in agreement.  CODE STATUS:     Code Status Orders        Start     Ordered   11/11/15 2243  Do not attempt resuscitation (DNR)   Continuous    Question Answer Comment  In the event of cardiac or respiratory ARREST Do not call a "code blue"   In the event of cardiac or respiratory ARREST Do not perform Intubation, CPR, defibrillation or ACLS   In the event of cardiac or respiratory ARREST Use medication by any route, position, wound care, and other measures to relive pain and suffering. May use oxygen, suction and manual treatment of airway obstruction as needed for comfort.      11/11/15 2243    Code Status History    Date Active Date Inactive Code Status Order ID Comments User Context   09/30/2015  5:46 PM 10/01/2015  4:34 PM Full Code [id number]  Juanell FairlyKevin Krasinski, MD ED   06/05/2013  1:20 AM 06/09/2013  9:41 PM Full Code [id number]  Alison MurrayAlma M Devine, MD Inpatient    Advance Directive Documentation        Most Recent Value   Type of Advance Directive  Healthcare Power of Attorney, Living will   Pre-existing out of facility DNR order (yellow form or pink MOST form)     "MOST" Form in Place?         TOTAL TIME TAKING CARE OF THIS PATIENT:  32 minutes.    Alford HighlandWIETING, Coty Student M.D on 11/12/2015 at 11:06 AM  Between 7am to 6pm - Pager - [phone]  After 6pm go to [email or web address] - password [email or web address]  [employer] Office  [phone]  CC: Primary care physician; BABAOFF, Lavada MesiMARC E, MD

## 2015-11-12 NOTE — [place] FL2 (Signed)
[place] MEDICAID FL2 LEVEL OF CARE SCREENING TOOL     IDENTIFICATION  Patient Name: Daisy CabotMolene T Radigan Birthdate: [birth year] Sex: female Admission Date (Current Location): 11/11/2015  [facility] [place]   Facility and Address:  [facility], [address], [place], [place] [address]      Provider Number: [id number]  Attending Physician Name and Address:  Alford Highlandichard Wieting, MD  Relative Name and Phone Number:       Current Level of Care:  (alf) Recommended Level of Care: Assisted Living Facility Prior Approval Number:    Date Approved/Denied:   PASRR Number:    Discharge Plan:  (ALF)    Current Diagnoses: Patient Active Problem List   Diagnosis Date Noted  . Rectal bleed 11/11/2015  . Dementia 11/11/2015  . COPD (chronic obstructive pulmonary disease) (HCC) 11/11/2015  . Dyslipidemia 11/11/2015  . Infection of urinary tract 10/04/2015  . Escherichia coli (E. coli) infection 10/04/2015  . CKD (chronic kidney disease), stage III 10/04/2015  . Hyperglycemia 10/04/2015  . MRSA carrier 10/04/2015  . Leukocytosis 10/04/2015  . Thrombocytopenia (HCC) 10/04/2015  . Closed right hip fracture (HCC) 09/30/2015  . Recurrent UTI 11/12/2014  . Microscopic hematuria 11/12/2014  . Atrophic vaginitis 11/12/2014  . CVA (cerebral infarction) 06/05/2013  . Hypotension, unspecified 06/05/2013  . Other and unspecified hyperlipidemia 06/05/2013  . Acute renal failure (HCC) 06/05/2013  . Leukocytosis, unspecified 06/05/2013  . Essential hypertension, benign 06/05/2013  . Depression 06/05/2013  . GERD (gastroesophageal reflux disease) 06/05/2013    Orientation RESPIRATION BLADDER Height & Weight     Self  Normal Incontinent Weight: 186 lb 8 oz (84.596 kg) Height:  5' (152.4 cm)  BEHAVIORAL SYMPTOMS/MOOD NEUROLOGICAL BOWEL NUTRITION STATUS   (none)   Continent Diet (regular)  AMBULATORY STATUS COMMUNICATION OF NEEDS Skin   Limited  Assist Verbally Normal                       Personal Care Assistance Level of Assistance  Bathing, Dressing, Feeding Bathing Assistance: Limited assistance Feeding assistance: Limited assistance Dressing Assistance: Limited assistance     Functional Limitations Info             SPECIAL CARE FACTORS FREQUENCY                       Contractures Contractures Info: Not present    Additional Factors Info  Code Status Code Status Info: DNR             DISCHARGE MEDICATIONS:   Current Discharge Medication List    CONTINUE these medications which have NOT CHANGED   Details  carvedilol (COREG) 12.5 MG tablet Take 1 tablet (12.5 mg total) by mouth 2 (two) times daily with a meal. Qty: 62 tablet, Refills: 0    citalopram (CELEXA) 20 MG tablet Take 30 mg by mouth daily.     Fluticasone-Salmeterol (ADVAIR) 250-50 MCG/DOSE AEPB Inhale 1 puff into the lungs 2 (two) times daily.    furosemide (LASIX) 40 MG tablet Take 40 mg by mouth daily.    nystatin (NYSTATIN) powder Apply 1 g topically 2 (two) times daily as needed (for irritation).     omeprazole (PRILOSEC) 20 MG capsule Take 20 mg by mouth daily before breakfast.     senna-docusate (SENOKOT-S) 8.6-50 MG tablet Take 1 tablet by mouth daily.     simvastatin (ZOCOR) 20 MG tablet Take 20 mg by mouth daily.  tiotropium (SPIRIVA) 18 MCG inhalation capsule Place 18 mcg into inhaler and inhale daily.    vitamin B-12 (CYANOCOBALAMIN) 1000 MCG tablet Take 1,000 mcg by mouth daily.      STOP taking these medications     aspirin 325 MG tablet               Discharge Medications: Please see discharge summary for a list of discharge medications.  Relevant Imaging Results:  Relevant Lab Results:   Additional Information    York SpanielMonica Arli Bree, LCSW

## 2015-11-12 NOTE — Care Management Obs Status (Signed)
MEDICARE OBSERVATION STATUS NOTIFICATION   Patient Details  Name: Daisy [name] MRN: [id number] Date of Birth: 10-08-1921   Medicare Observation Status Notification Given:  No (DC order written < 24 hours)    Caren MacadamMichelle Naveh Rickles, RN 11/12/2015, 1:14 PM

## 2015-11-15 DIAGNOSIS — N390 Urinary tract infection, site not specified: Secondary | ICD-10-CM | POA: Diagnosis not present

## 2015-11-15 DIAGNOSIS — R300 Dysuria: Secondary | ICD-10-CM | POA: Diagnosis not present

## 2015-11-21 DIAGNOSIS — I130 Hypertensive heart and chronic kidney disease with heart failure and stage 1 through stage 4 chronic kidney disease, or unspecified chronic kidney disease: Secondary | ICD-10-CM | POA: Diagnosis not present

## 2015-11-21 DIAGNOSIS — J449 Chronic obstructive pulmonary disease, unspecified: Secondary | ICD-10-CM | POA: Diagnosis not present

## 2015-11-21 DIAGNOSIS — N183 Chronic kidney disease, stage 3 (moderate): Secondary | ICD-10-CM | POA: Diagnosis not present

## 2015-11-21 DIAGNOSIS — G301 Alzheimer's disease with late onset: Secondary | ICD-10-CM | POA: Diagnosis not present

## 2015-11-21 DIAGNOSIS — I2510 Atherosclerotic heart disease of native coronary artery without angina pectoris: Secondary | ICD-10-CM | POA: Diagnosis not present

## 2015-11-21 DIAGNOSIS — F329 Major depressive disorder, single episode, unspecified: Secondary | ICD-10-CM | POA: Diagnosis not present

## 2015-11-21 DIAGNOSIS — I252 Old myocardial infarction: Secondary | ICD-10-CM | POA: Diagnosis not present

## 2015-11-21 DIAGNOSIS — F0280 Dementia in other diseases classified elsewhere without behavioral disturbance: Secondary | ICD-10-CM | POA: Diagnosis not present

## 2015-11-21 DIAGNOSIS — S72141D Displaced intertrochanteric fracture of right femur, subsequent encounter for closed fracture with routine healing: Secondary | ICD-10-CM | POA: Diagnosis not present

## 2015-11-21 DIAGNOSIS — F0390 Unspecified dementia without behavioral disturbance: Secondary | ICD-10-CM | POA: Diagnosis not present

## 2015-11-21 DIAGNOSIS — M6281 Muscle weakness (generalized): Secondary | ICD-10-CM | POA: Diagnosis not present

## 2015-11-21 DIAGNOSIS — I509 Heart failure, unspecified: Secondary | ICD-10-CM | POA: Diagnosis not present

## 2015-12-20 DIAGNOSIS — F0390 Unspecified dementia without behavioral disturbance: Secondary | ICD-10-CM | POA: Diagnosis not present

## 2015-12-20 DIAGNOSIS — N183 Chronic kidney disease, stage 3 (moderate): Secondary | ICD-10-CM | POA: Diagnosis not present

## 2015-12-20 DIAGNOSIS — I2510 Atherosclerotic heart disease of native coronary artery without angina pectoris: Secondary | ICD-10-CM | POA: Diagnosis not present

## 2015-12-20 DIAGNOSIS — I130 Hypertensive heart and chronic kidney disease with heart failure and stage 1 through stage 4 chronic kidney disease, or unspecified chronic kidney disease: Secondary | ICD-10-CM | POA: Diagnosis not present

## 2015-12-20 DIAGNOSIS — I252 Old myocardial infarction: Secondary | ICD-10-CM | POA: Diagnosis not present

## 2015-12-20 DIAGNOSIS — I509 Heart failure, unspecified: Secondary | ICD-10-CM | POA: Diagnosis not present

## 2015-12-20 DIAGNOSIS — F329 Major depressive disorder, single episode, unspecified: Secondary | ICD-10-CM | POA: Diagnosis not present

## 2015-12-20 DIAGNOSIS — M6281 Muscle weakness (generalized): Secondary | ICD-10-CM | POA: Diagnosis not present

## 2015-12-20 DIAGNOSIS — J449 Chronic obstructive pulmonary disease, unspecified: Secondary | ICD-10-CM | POA: Diagnosis not present

## 2015-12-20 DIAGNOSIS — S72141D Displaced intertrochanteric fracture of right femur, subsequent encounter for closed fracture with routine healing: Secondary | ICD-10-CM | POA: Diagnosis not present

## 2015-12-23 DIAGNOSIS — I509 Heart failure, unspecified: Secondary | ICD-10-CM | POA: Diagnosis not present

## 2015-12-23 DIAGNOSIS — I2510 Atherosclerotic heart disease of native coronary artery without angina pectoris: Secondary | ICD-10-CM | POA: Diagnosis not present

## 2015-12-23 DIAGNOSIS — N390 Urinary tract infection, site not specified: Secondary | ICD-10-CM | POA: Diagnosis not present

## 2015-12-23 DIAGNOSIS — S72141D Displaced intertrochanteric fracture of right femur, subsequent encounter for closed fracture with routine healing: Secondary | ICD-10-CM | POA: Diagnosis not present

## 2015-12-23 DIAGNOSIS — R300 Dysuria: Secondary | ICD-10-CM | POA: Diagnosis not present

## 2015-12-23 DIAGNOSIS — I252 Old myocardial infarction: Secondary | ICD-10-CM | POA: Diagnosis not present

## 2015-12-23 DIAGNOSIS — I130 Hypertensive heart and chronic kidney disease with heart failure and stage 1 through stage 4 chronic kidney disease, or unspecified chronic kidney disease: Secondary | ICD-10-CM | POA: Diagnosis not present

## 2015-12-23 DIAGNOSIS — F0390 Unspecified dementia without behavioral disturbance: Secondary | ICD-10-CM | POA: Diagnosis not present

## 2015-12-23 DIAGNOSIS — N183 Chronic kidney disease, stage 3 (moderate): Secondary | ICD-10-CM | POA: Diagnosis not present

## 2015-12-23 DIAGNOSIS — M6281 Muscle weakness (generalized): Secondary | ICD-10-CM | POA: Diagnosis not present

## 2015-12-23 DIAGNOSIS — J449 Chronic obstructive pulmonary disease, unspecified: Secondary | ICD-10-CM | POA: Diagnosis not present

## 2015-12-23 DIAGNOSIS — F329 Major depressive disorder, single episode, unspecified: Secondary | ICD-10-CM | POA: Diagnosis not present

## 2015-12-27 DIAGNOSIS — S72141D Displaced intertrochanteric fracture of right femur, subsequent encounter for closed fracture with routine healing: Secondary | ICD-10-CM | POA: Diagnosis not present

## 2015-12-27 DIAGNOSIS — J449 Chronic obstructive pulmonary disease, unspecified: Secondary | ICD-10-CM | POA: Diagnosis not present

## 2015-12-27 DIAGNOSIS — F329 Major depressive disorder, single episode, unspecified: Secondary | ICD-10-CM | POA: Diagnosis not present

## 2015-12-27 DIAGNOSIS — I509 Heart failure, unspecified: Secondary | ICD-10-CM | POA: Diagnosis not present

## 2015-12-27 DIAGNOSIS — I2510 Atherosclerotic heart disease of native coronary artery without angina pectoris: Secondary | ICD-10-CM | POA: Diagnosis not present

## 2015-12-27 DIAGNOSIS — M6281 Muscle weakness (generalized): Secondary | ICD-10-CM | POA: Diagnosis not present

## 2015-12-27 DIAGNOSIS — N183 Chronic kidney disease, stage 3 (moderate): Secondary | ICD-10-CM | POA: Diagnosis not present

## 2015-12-27 DIAGNOSIS — F0390 Unspecified dementia without behavioral disturbance: Secondary | ICD-10-CM | POA: Diagnosis not present

## 2015-12-27 DIAGNOSIS — I130 Hypertensive heart and chronic kidney disease with heart failure and stage 1 through stage 4 chronic kidney disease, or unspecified chronic kidney disease: Secondary | ICD-10-CM | POA: Diagnosis not present

## 2015-12-27 DIAGNOSIS — I252 Old myocardial infarction: Secondary | ICD-10-CM | POA: Diagnosis not present

## 2016-01-03 DIAGNOSIS — I130 Hypertensive heart and chronic kidney disease with heart failure and stage 1 through stage 4 chronic kidney disease, or unspecified chronic kidney disease: Secondary | ICD-10-CM | POA: Diagnosis not present

## 2016-01-03 DIAGNOSIS — N390 Urinary tract infection, site not specified: Secondary | ICD-10-CM | POA: Diagnosis not present

## 2016-01-03 DIAGNOSIS — S72141D Displaced intertrochanteric fracture of right femur, subsequent encounter for closed fracture with routine healing: Secondary | ICD-10-CM | POA: Diagnosis not present

## 2016-01-03 DIAGNOSIS — N183 Chronic kidney disease, stage 3 (moderate): Secondary | ICD-10-CM | POA: Diagnosis not present

## 2016-01-03 DIAGNOSIS — F329 Major depressive disorder, single episode, unspecified: Secondary | ICD-10-CM | POA: Diagnosis not present

## 2016-01-03 DIAGNOSIS — I2510 Atherosclerotic heart disease of native coronary artery without angina pectoris: Secondary | ICD-10-CM | POA: Diagnosis not present

## 2016-01-03 DIAGNOSIS — M6281 Muscle weakness (generalized): Secondary | ICD-10-CM | POA: Diagnosis not present

## 2016-01-03 DIAGNOSIS — J449 Chronic obstructive pulmonary disease, unspecified: Secondary | ICD-10-CM | POA: Diagnosis not present

## 2016-01-03 DIAGNOSIS — F0390 Unspecified dementia without behavioral disturbance: Secondary | ICD-10-CM | POA: Diagnosis not present

## 2016-01-03 DIAGNOSIS — I509 Heart failure, unspecified: Secondary | ICD-10-CM | POA: Diagnosis not present

## 2016-01-11 DIAGNOSIS — R8290 Unspecified abnormal findings in urine: Secondary | ICD-10-CM | POA: Diagnosis not present

## 2016-01-11 DIAGNOSIS — R300 Dysuria: Secondary | ICD-10-CM | POA: Diagnosis not present

## 2016-01-13 DIAGNOSIS — R8271 Bacteriuria: Secondary | ICD-10-CM | POA: Diagnosis not present

## 2016-01-17 DIAGNOSIS — R2681 Unsteadiness on feet: Secondary | ICD-10-CM | POA: Diagnosis not present

## 2016-01-17 DIAGNOSIS — R269 Unspecified abnormalities of gait and mobility: Secondary | ICD-10-CM | POA: Diagnosis not present

## 2016-01-19 DIAGNOSIS — F0390 Unspecified dementia without behavioral disturbance: Secondary | ICD-10-CM | POA: Diagnosis not present

## 2016-01-19 DIAGNOSIS — I509 Heart failure, unspecified: Secondary | ICD-10-CM | POA: Diagnosis not present

## 2016-01-19 DIAGNOSIS — N183 Chronic kidney disease, stage 3 (moderate): Secondary | ICD-10-CM | POA: Diagnosis not present

## 2016-01-19 DIAGNOSIS — J449 Chronic obstructive pulmonary disease, unspecified: Secondary | ICD-10-CM | POA: Diagnosis not present

## 2016-01-19 DIAGNOSIS — S72141D Displaced intertrochanteric fracture of right femur, subsequent encounter for closed fracture with routine healing: Secondary | ICD-10-CM | POA: Diagnosis not present

## 2016-01-19 DIAGNOSIS — I130 Hypertensive heart and chronic kidney disease with heart failure and stage 1 through stage 4 chronic kidney disease, or unspecified chronic kidney disease: Secondary | ICD-10-CM | POA: Diagnosis not present

## 2016-01-19 DIAGNOSIS — M6281 Muscle weakness (generalized): Secondary | ICD-10-CM | POA: Diagnosis not present

## 2016-01-19 DIAGNOSIS — N390 Urinary tract infection, site not specified: Secondary | ICD-10-CM | POA: Diagnosis not present

## 2016-01-19 DIAGNOSIS — F329 Major depressive disorder, single episode, unspecified: Secondary | ICD-10-CM | POA: Diagnosis not present

## 2016-01-19 DIAGNOSIS — I2510 Atherosclerotic heart disease of native coronary artery without angina pectoris: Secondary | ICD-10-CM | POA: Diagnosis not present

## 2016-01-24 DIAGNOSIS — S72141D Displaced intertrochanteric fracture of right femur, subsequent encounter for closed fracture with routine healing: Secondary | ICD-10-CM | POA: Diagnosis not present

## 2016-01-24 DIAGNOSIS — N390 Urinary tract infection, site not specified: Secondary | ICD-10-CM | POA: Diagnosis not present

## 2016-01-24 DIAGNOSIS — I2510 Atherosclerotic heart disease of native coronary artery without angina pectoris: Secondary | ICD-10-CM | POA: Diagnosis not present

## 2016-01-24 DIAGNOSIS — I509 Heart failure, unspecified: Secondary | ICD-10-CM | POA: Diagnosis not present

## 2016-01-24 DIAGNOSIS — J449 Chronic obstructive pulmonary disease, unspecified: Secondary | ICD-10-CM | POA: Diagnosis not present

## 2016-01-24 DIAGNOSIS — N950 Postmenopausal bleeding: Secondary | ICD-10-CM | POA: Diagnosis not present

## 2016-01-24 DIAGNOSIS — I130 Hypertensive heart and chronic kidney disease with heart failure and stage 1 through stage 4 chronic kidney disease, or unspecified chronic kidney disease: Secondary | ICD-10-CM | POA: Diagnosis not present

## 2016-01-24 DIAGNOSIS — F329 Major depressive disorder, single episode, unspecified: Secondary | ICD-10-CM | POA: Diagnosis not present

## 2016-01-24 DIAGNOSIS — N183 Chronic kidney disease, stage 3 (moderate): Secondary | ICD-10-CM | POA: Diagnosis not present

## 2016-01-24 DIAGNOSIS — F0390 Unspecified dementia without behavioral disturbance: Secondary | ICD-10-CM | POA: Diagnosis not present

## 2016-01-24 DIAGNOSIS — M6281 Muscle weakness (generalized): Secondary | ICD-10-CM | POA: Diagnosis not present

## 2016-02-07 DIAGNOSIS — S72141D Displaced intertrochanteric fracture of right femur, subsequent encounter for closed fracture with routine healing: Secondary | ICD-10-CM | POA: Diagnosis not present

## 2016-02-14 DIAGNOSIS — F0390 Unspecified dementia without behavioral disturbance: Secondary | ICD-10-CM | POA: Diagnosis not present

## 2016-02-14 DIAGNOSIS — S72141D Displaced intertrochanteric fracture of right femur, subsequent encounter for closed fracture with routine healing: Secondary | ICD-10-CM | POA: Diagnosis not present

## 2016-02-14 DIAGNOSIS — N390 Urinary tract infection, site not specified: Secondary | ICD-10-CM | POA: Diagnosis not present

## 2016-02-14 DIAGNOSIS — I130 Hypertensive heart and chronic kidney disease with heart failure and stage 1 through stage 4 chronic kidney disease, or unspecified chronic kidney disease: Secondary | ICD-10-CM | POA: Diagnosis not present

## 2016-02-14 DIAGNOSIS — N183 Chronic kidney disease, stage 3 (moderate): Secondary | ICD-10-CM | POA: Diagnosis not present

## 2016-02-14 DIAGNOSIS — M6281 Muscle weakness (generalized): Secondary | ICD-10-CM | POA: Diagnosis not present

## 2016-02-14 DIAGNOSIS — I2510 Atherosclerotic heart disease of native coronary artery without angina pectoris: Secondary | ICD-10-CM | POA: Diagnosis not present

## 2016-02-14 DIAGNOSIS — J449 Chronic obstructive pulmonary disease, unspecified: Secondary | ICD-10-CM | POA: Diagnosis not present

## 2016-02-14 DIAGNOSIS — F329 Major depressive disorder, single episode, unspecified: Secondary | ICD-10-CM | POA: Diagnosis not present

## 2016-02-14 DIAGNOSIS — I509 Heart failure, unspecified: Secondary | ICD-10-CM | POA: Diagnosis not present

## 2016-02-17 DIAGNOSIS — N183 Chronic kidney disease, stage 3 (moderate): Secondary | ICD-10-CM | POA: Diagnosis not present

## 2016-02-17 DIAGNOSIS — I130 Hypertensive heart and chronic kidney disease with heart failure and stage 1 through stage 4 chronic kidney disease, or unspecified chronic kidney disease: Secondary | ICD-10-CM | POA: Diagnosis not present

## 2016-02-17 DIAGNOSIS — R269 Unspecified abnormalities of gait and mobility: Secondary | ICD-10-CM | POA: Diagnosis not present

## 2016-02-17 DIAGNOSIS — M6281 Muscle weakness (generalized): Secondary | ICD-10-CM | POA: Diagnosis not present

## 2016-02-17 DIAGNOSIS — F329 Major depressive disorder, single episode, unspecified: Secondary | ICD-10-CM | POA: Diagnosis not present

## 2016-02-17 DIAGNOSIS — F0390 Unspecified dementia without behavioral disturbance: Secondary | ICD-10-CM | POA: Diagnosis not present

## 2016-02-17 DIAGNOSIS — R2681 Unsteadiness on feet: Secondary | ICD-10-CM | POA: Diagnosis not present

## 2016-02-17 DIAGNOSIS — I509 Heart failure, unspecified: Secondary | ICD-10-CM | POA: Diagnosis not present

## 2016-02-17 DIAGNOSIS — I2510 Atherosclerotic heart disease of native coronary artery without angina pectoris: Secondary | ICD-10-CM | POA: Diagnosis not present

## 2016-02-17 DIAGNOSIS — N390 Urinary tract infection, site not specified: Secondary | ICD-10-CM | POA: Diagnosis not present

## 2016-02-17 DIAGNOSIS — J449 Chronic obstructive pulmonary disease, unspecified: Secondary | ICD-10-CM | POA: Diagnosis not present

## 2016-02-17 DIAGNOSIS — S72141D Displaced intertrochanteric fracture of right femur, subsequent encounter for closed fracture with routine healing: Secondary | ICD-10-CM | POA: Diagnosis not present

## 2016-02-21 DIAGNOSIS — F0390 Unspecified dementia without behavioral disturbance: Secondary | ICD-10-CM | POA: Diagnosis not present

## 2016-02-21 DIAGNOSIS — N183 Chronic kidney disease, stage 3 (moderate): Secondary | ICD-10-CM | POA: Diagnosis not present

## 2016-02-21 DIAGNOSIS — M6281 Muscle weakness (generalized): Secondary | ICD-10-CM | POA: Diagnosis not present

## 2016-02-21 DIAGNOSIS — S72141D Displaced intertrochanteric fracture of right femur, subsequent encounter for closed fracture with routine healing: Secondary | ICD-10-CM | POA: Diagnosis not present

## 2016-02-21 DIAGNOSIS — F329 Major depressive disorder, single episode, unspecified: Secondary | ICD-10-CM | POA: Diagnosis not present

## 2016-02-21 DIAGNOSIS — I2510 Atherosclerotic heart disease of native coronary artery without angina pectoris: Secondary | ICD-10-CM | POA: Diagnosis not present

## 2016-02-21 DIAGNOSIS — N390 Urinary tract infection, site not specified: Secondary | ICD-10-CM | POA: Diagnosis not present

## 2016-02-21 DIAGNOSIS — I509 Heart failure, unspecified: Secondary | ICD-10-CM | POA: Diagnosis not present

## 2016-02-21 DIAGNOSIS — I130 Hypertensive heart and chronic kidney disease with heart failure and stage 1 through stage 4 chronic kidney disease, or unspecified chronic kidney disease: Secondary | ICD-10-CM | POA: Diagnosis not present

## 2016-02-21 DIAGNOSIS — J449 Chronic obstructive pulmonary disease, unspecified: Secondary | ICD-10-CM | POA: Diagnosis not present

## 2016-02-28 DIAGNOSIS — N390 Urinary tract infection, site not specified: Secondary | ICD-10-CM | POA: Diagnosis not present

## 2016-02-28 DIAGNOSIS — I130 Hypertensive heart and chronic kidney disease with heart failure and stage 1 through stage 4 chronic kidney disease, or unspecified chronic kidney disease: Secondary | ICD-10-CM | POA: Diagnosis not present

## 2016-02-28 DIAGNOSIS — I509 Heart failure, unspecified: Secondary | ICD-10-CM | POA: Diagnosis not present

## 2016-02-28 DIAGNOSIS — F0390 Unspecified dementia without behavioral disturbance: Secondary | ICD-10-CM | POA: Diagnosis not present

## 2016-02-28 DIAGNOSIS — I2510 Atherosclerotic heart disease of native coronary artery without angina pectoris: Secondary | ICD-10-CM | POA: Diagnosis not present

## 2016-02-28 DIAGNOSIS — S72141D Displaced intertrochanteric fracture of right femur, subsequent encounter for closed fracture with routine healing: Secondary | ICD-10-CM | POA: Diagnosis not present

## 2016-02-28 DIAGNOSIS — F329 Major depressive disorder, single episode, unspecified: Secondary | ICD-10-CM | POA: Diagnosis not present

## 2016-02-28 DIAGNOSIS — J449 Chronic obstructive pulmonary disease, unspecified: Secondary | ICD-10-CM | POA: Diagnosis not present

## 2016-02-28 DIAGNOSIS — M6281 Muscle weakness (generalized): Secondary | ICD-10-CM | POA: Diagnosis not present

## 2016-02-28 DIAGNOSIS — N183 Chronic kidney disease, stage 3 (moderate): Secondary | ICD-10-CM | POA: Diagnosis not present

## 2016-03-13 DIAGNOSIS — A630 Anogenital (venereal) warts: Secondary | ICD-10-CM | POA: Diagnosis not present

## 2016-03-13 DIAGNOSIS — Z23 Encounter for immunization: Secondary | ICD-10-CM | POA: Diagnosis not present

## 2016-03-18 DIAGNOSIS — R2681 Unsteadiness on feet: Secondary | ICD-10-CM | POA: Diagnosis not present

## 2016-03-18 DIAGNOSIS — R269 Unspecified abnormalities of gait and mobility: Secondary | ICD-10-CM | POA: Diagnosis not present

## 2016-03-19 DIAGNOSIS — L821 Other seborrheic keratosis: Secondary | ICD-10-CM | POA: Diagnosis not present

## 2016-03-19 DIAGNOSIS — L570 Actinic keratosis: Secondary | ICD-10-CM | POA: Diagnosis not present

## 2016-03-19 DIAGNOSIS — L853 Xerosis cutis: Secondary | ICD-10-CM | POA: Diagnosis not present

## 2016-03-19 DIAGNOSIS — Z1283 Encounter for screening for malignant neoplasm of skin: Secondary | ICD-10-CM | POA: Diagnosis not present

## 2016-03-19 DIAGNOSIS — L820 Inflamed seborrheic keratosis: Secondary | ICD-10-CM | POA: Diagnosis not present

## 2016-03-19 DIAGNOSIS — L578 Other skin changes due to chronic exposure to nonionizing radiation: Secondary | ICD-10-CM | POA: Diagnosis not present

## 2016-03-19 DIAGNOSIS — L720 Epidermal cyst: Secondary | ICD-10-CM | POA: Diagnosis not present

## 2016-04-18 DIAGNOSIS — R2681 Unsteadiness on feet: Secondary | ICD-10-CM | POA: Diagnosis not present

## 2016-04-18 DIAGNOSIS — R269 Unspecified abnormalities of gait and mobility: Secondary | ICD-10-CM | POA: Diagnosis not present

## 2016-07-19 DIAGNOSIS — H10423 Simple chronic conjunctivitis, bilateral: Secondary | ICD-10-CM | POA: Diagnosis not present

## 2016-07-24 DIAGNOSIS — Z8679 Personal history of other diseases of the circulatory system: Secondary | ICD-10-CM | POA: Diagnosis not present

## 2016-07-24 DIAGNOSIS — D649 Anemia, unspecified: Secondary | ICD-10-CM | POA: Diagnosis not present

## 2016-07-24 DIAGNOSIS — R600 Localized edema: Secondary | ICD-10-CM | POA: Diagnosis not present

## 2016-07-24 DIAGNOSIS — I5032 Chronic diastolic (congestive) heart failure: Secondary | ICD-10-CM | POA: Diagnosis not present

## 2016-07-24 DIAGNOSIS — F0390 Unspecified dementia without behavioral disturbance: Secondary | ICD-10-CM | POA: Diagnosis not present

## 2016-07-24 DIAGNOSIS — N183 Chronic kidney disease, stage 3 (moderate): Secondary | ICD-10-CM | POA: Diagnosis not present

## 2016-07-24 DIAGNOSIS — I10 Essential (primary) hypertension: Secondary | ICD-10-CM | POA: Diagnosis not present

## 2016-07-24 DIAGNOSIS — E784 Other hyperlipidemia: Secondary | ICD-10-CM | POA: Diagnosis not present

## 2016-07-24 DIAGNOSIS — I639 Cerebral infarction, unspecified: Secondary | ICD-10-CM | POA: Diagnosis not present

## 2016-07-24 DIAGNOSIS — K219 Gastro-esophageal reflux disease without esophagitis: Secondary | ICD-10-CM | POA: Diagnosis not present

## 2016-07-24 DIAGNOSIS — R0602 Shortness of breath: Secondary | ICD-10-CM | POA: Diagnosis not present

## 2016-07-24 DIAGNOSIS — R000 Tachycardia, unspecified: Secondary | ICD-10-CM | POA: Diagnosis not present

## 2016-10-05 DIAGNOSIS — J4541 Moderate persistent asthma with (acute) exacerbation: Secondary | ICD-10-CM | POA: Diagnosis not present

## 2016-11-09 DIAGNOSIS — J988 Other specified respiratory disorders: Secondary | ICD-10-CM | POA: Diagnosis not present

## 2016-11-09 DIAGNOSIS — H10023 Other mucopurulent conjunctivitis, bilateral: Secondary | ICD-10-CM | POA: Diagnosis not present

## 2016-12-03 DIAGNOSIS — B372 Candidiasis of skin and nail: Secondary | ICD-10-CM | POA: Diagnosis not present

## 2016-12-31 DIAGNOSIS — R410 Disorientation, unspecified: Secondary | ICD-10-CM | POA: Diagnosis not present

## 2016-12-31 DIAGNOSIS — R300 Dysuria: Secondary | ICD-10-CM | POA: Diagnosis not present

## 2016-12-31 DIAGNOSIS — B372 Candidiasis of skin and nail: Secondary | ICD-10-CM | POA: Diagnosis not present

## 2016-12-31 DIAGNOSIS — N390 Urinary tract infection, site not specified: Secondary | ICD-10-CM | POA: Diagnosis not present

## 2017-01-16 ENCOUNTER — Emergency Department
Admission: EM | Admit: 2017-01-16 | Discharge: 2017-01-16 | Disposition: A | Discharge status: Skilled Nursing Facility | Payer: PPO | Attending: Emergency Medicine | Admitting: Emergency Medicine

## 2017-01-16 ENCOUNTER — Encounter: Payer: Self-pay | Admitting: Emergency Medicine

## 2017-01-16 ENCOUNTER — Emergency Department: Payer: PPO

## 2017-01-16 DIAGNOSIS — Z87891 Personal history of nicotine dependence: Secondary | ICD-10-CM | POA: Diagnosis not present

## 2017-01-16 DIAGNOSIS — Z79899 Other long term (current) drug therapy: Secondary | ICD-10-CM | POA: Insufficient documentation

## 2017-01-16 DIAGNOSIS — J449 Chronic obstructive pulmonary disease, unspecified: Secondary | ICD-10-CM | POA: Diagnosis not present

## 2017-01-16 DIAGNOSIS — N183 Chronic kidney disease, stage 3 (moderate): Secondary | ICD-10-CM | POA: Diagnosis not present

## 2017-01-16 DIAGNOSIS — I517 Cardiomegaly: Secondary | ICD-10-CM | POA: Diagnosis not present

## 2017-01-16 DIAGNOSIS — J45909 Unspecified asthma, uncomplicated: Secondary | ICD-10-CM | POA: Diagnosis not present

## 2017-01-16 DIAGNOSIS — R410 Disorientation, unspecified: Secondary | ICD-10-CM | POA: Diagnosis present

## 2017-01-16 DIAGNOSIS — I10 Essential (primary) hypertension: Secondary | ICD-10-CM | POA: Diagnosis not present

## 2017-01-16 DIAGNOSIS — I129 Hypertensive chronic kidney disease with stage 1 through stage 4 chronic kidney disease, or unspecified chronic kidney disease: Secondary | ICD-10-CM | POA: Insufficient documentation

## 2017-01-16 DIAGNOSIS — N390 Urinary tract infection, site not specified: Secondary | ICD-10-CM

## 2017-01-16 LAB — CBC
HEMATOCRIT: 35 % (ref 35.0–47.0)
HEMOGLOBIN: 11.4 g/dL — AB (ref 12.0–16.0)
MCH: 28.6 pg (ref 26.0–34.0)
MCHC: 32.6 g/dL (ref 32.0–36.0)
MCV: 87.7 fL (ref 80.0–100.0)
Platelets: 199 10*3/uL (ref 150–440)
RBC: 4 MIL/uL (ref 3.80–5.20)
RDW: 15.2 % — AB (ref 11.5–14.5)
WBC: 7.4 10*3/uL (ref 3.6–11.0)

## 2017-01-16 LAB — COMPREHENSIVE METABOLIC PANEL
ALBUMIN: 3.6 g/dL (ref 3.5–5.0)
ALK PHOS: 59 U/L (ref 38–126)
ALT: 7 U/L — ABNORMAL LOW (ref 14–54)
ANION GAP: 7 (ref 5–15)
AST: 13 U/L — ABNORMAL LOW (ref 15–41)
BILIRUBIN TOTAL: 0.5 mg/dL (ref 0.3–1.2)
BUN: 21 mg/dL — ABNORMAL HIGH (ref 6–20)
CALCIUM: 8.7 mg/dL — AB (ref 8.9–10.3)
CO2: 30 mmol/L (ref 22–32)
Chloride: 102 mmol/L (ref 101–111)
Creatinine, Ser: 1.32 mg/dL — ABNORMAL HIGH (ref 0.44–1.00)
GFR calc Af Amer: 39 mL/min — ABNORMAL LOW (ref >60)
GFR, EST NON AFRICAN AMERICAN: 34 mL/min — AB (ref >60)
GLUCOSE: 122 mg/dL — AB (ref 65–99)
POTASSIUM: 4.1 mmol/L (ref 3.5–5.1)
Sodium: 139 mmol/L (ref 135–145)
TOTAL PROTEIN: 7 g/dL (ref 6.5–8.1)

## 2017-01-16 LAB — URINALYSIS, COMPLETE (UACMP) WITH MICROSCOPIC
BILIRUBIN URINE: NEGATIVE
Bacteria, UA: NONE SEEN
GLUCOSE, UA: NEGATIVE mg/dL
KETONES UR: NEGATIVE mg/dL
NITRITE: NEGATIVE
PH: 7 (ref 5.0–8.0)
Protein, ur: 100 mg/dL — AB
SPECIFIC GRAVITY, URINE: 1.016 (ref 1.005–1.030)

## 2017-01-16 LAB — TROPONIN I

## 2017-01-16 MED ORDER — CEPHALEXIN 500 MG PO CAPS: 500.0000 mg | ORAL_CAPSULE | Freq: Two times a day (BID) | ORAL | 0 refills | Status: AC

## 2017-01-16 MED ORDER — SODIUM CHLORIDE 0.9 % IV BOLUS (SEPSIS)
1000.0000 mL | Freq: Once | INTRAVENOUS | Status: AC
  Administered 2017-01-16: 1000 mL via INTRAVENOUS

## 2017-01-16 MED ORDER — DEXTROSE 5 % IV SOLN
1.0000 g | Freq: Once | INTRAVENOUS | Status: AC
  Administered 2017-01-16: 1 g via INTRAVENOUS
  Filled 2017-01-16: qty 10

## 2017-01-16 MED ORDER — MOXIFLOXACIN HCL 0.5 % OP SOLN: 1.0000 [drp] | Freq: Three times a day (TID) | OPHTHALMIC | 0 refills | Status: AC

## 2017-01-16 NOTE — Discharge Instructions (Signed)
Return to the ER for new or worsening weakness, change in mental status, fever, vomiting, back pain, blood in the urine or any other new or worsening symptoms that concern you.  Follow up with the primary physician within the next several days.

## 2017-01-16 NOTE — ED Notes (Signed)
ED Provider at bedside. 

## 2017-01-16 NOTE — ED Provider Notes (Signed)
[facility] Emergency Department Provider Note ____________________________________________   First MD Initiated Contact with Patient 01/16/17 1633     (approximate)  I have reviewed the triage vital signs and the nursing notes.   HISTORY  Chief Complaint Urinary Tract Infection  HPI limited by dementia.  Hx obtained from daughter.   HPI Daisy [name] is a 83 y.o. female with a hx of dementia who presents from her facility for worsened confusion over last 1-2 weeks, gradual onset, worsening course, intermittent.  Per daughter, pt was dx'ed with UTI 1 week ago and started on macrobid, but the worsened confusion has persisted.  No fevers, n/v/d, sob, cough or other acute sx.    Past Medical History:  Diagnosis Date  . Acute renal failure (HCC)   . Anemia   . Asthma   . Cerebral artery occlusion    with cerebral infarction  . Dementia   . Depression 06/05/2013  . Edema   . Essential hypertension, benign 06/05/2013  . GERD (gastroesophageal reflux disease) 06/05/2013  . Heart attack (HCC)   . Osteoarthritis   . Other and unspecified hyperlipidemia 06/05/2013  . Paronychia, toe   . Recurrent UTI   . Stroke [facility](HCC)     Patient Active Problem List   Diagnosis Date Noted  . Rectal bleed 11/11/2015  . Dementia 11/11/2015  . COPD (chronic obstructive pulmonary disease) (HCC) 11/11/2015  . Dyslipidemia 11/11/2015  . Infection of urinary tract 10/04/2015  . Escherichia coli (E. coli) infection 10/04/2015  . CKD (chronic kidney disease), stage III 10/04/2015  . Hyperglycemia 10/04/2015  . MRSA carrier 10/04/2015  . Leukocytosis 10/04/2015  . Thrombocytopenia (HCC) 10/04/2015  . Closed right hip fracture (HCC) 09/30/2015  . Recurrent UTI 11/12/2014  . Microscopic hematuria 11/12/2014  . Atrophic vaginitis 11/12/2014  . CVA (cerebral infarction) 06/05/2013  . Hypotension, unspecified 06/05/2013  . Other and unspecified hyperlipidemia 06/05/2013  .  Acute renal failure (HCC) 06/05/2013  . Leukocytosis, unspecified 06/05/2013  . Essential hypertension, benign 06/05/2013  . Depression 06/05/2013  . GERD (gastroesophageal reflux disease) 06/05/2013    Past Surgical History:  Procedure Laterality Date  . CARDIAC SURGERY     Left BBB  . FEMUR IM NAIL Right 10/01/2015   Procedure: INTRAMEDULLARY (IM) NAIL FEMORAL;  Surgeon: Juanell FairlyKevin Krasinski, MD;  Location: [facility] ORS;  Service: Orthopedics;  Laterality: Right;  . KNEE SURGERY Right     Prior to Admission medications   Medication Sig Start Date End Date Taking? Authorizing Provider  carvedilol (COREG) 12.5 MG tablet Take 1 tablet (12.5 mg total) by mouth 2 (two) times daily with a meal. 06/09/13   Rodolph Bonghompson, Daniel V, MD  cephALEXin (KEFLEX) 500 MG capsule Take 1 capsule (500 mg total) by mouth 2 (two) times daily. 01/17/17 01/27/17  Dionne BucySiadecki, Darien Kading, MD  citalopram (CELEXA) 20 MG tablet Take 30 mg by mouth daily.     [provider]  Fluticasone-Salmeterol (ADVAIR) 250-50 MCG/DOSE AEPB Inhale 1 puff into the lungs 2 (two) times daily.    [provider]  furosemide (LASIX) 40 MG tablet Take 40 mg by mouth daily.    [provider]  moxifloxacin (VIGAMOX) 0.5 % ophthalmic solution Place 1 drop into the left eye 3 (three) times daily. 01/16/17 01/23/17  Dionne BucySiadecki, Sincerity Cedar, MD  nystatin (NYSTATIN) powder Apply 1 g topically 2 (two) times daily as needed (for irritation).     [provider]  omeprazole (PRILOSEC) 20 MG capsule Take 20 mg by  mouth daily before breakfast.     [provider]  senna-docusate (SENOKOT-S) 8.6-50 MG tablet Take 1 tablet by mouth daily.     [provider]  simvastatin (ZOCOR) 20 MG tablet Take 20 mg by mouth daily.     [provider]  tiotropium (SPIRIVA) 18 MCG inhalation capsule Place 18 mcg into inhaler and inhale daily.    [provider]  vitamin B-12 (CYANOCOBALAMIN) 1000 MCG tablet Take 1,000  mcg by mouth daily.    [provider]    Allergies Aricept [donepezil hcl]; Codeine; and Ketek [telithromycin]  Family History  Problem Relation Age of Onset  . Prostate cancer Son   . Cancer Mother        Oral  . Colon cancer Sister   . Kidney disease Neg Hx   . Bladder Cancer Neg Hx     Social History Social History  Substance Use Topics  . Smoking status: Former Games developer  . Smokeless tobacco: Never Used     Comment: quit 50 + years  . Alcohol use 0.0 oz/week     Comment: occasionally    Review of Systems Level V caveat: unable to obtain ROS due to dementia    ____________________________________________   PHYSICAL EXAM:  VITAL SIGNS: ED Triage Vitals  Enc Vitals Group     BP 01/16/17 1601 (!) 165/89     Pulse Rate 01/16/17 1601 73     Resp 01/16/17 1601 16     Temp 01/16/17 1601 99.2 F (37.3 C)     Temp Source 01/16/17 1601 Oral     SpO2 01/16/17 1601 95 %     Weight 01/16/17 1602 170 lb (77.1 kg)     Height 01/16/17 1602 5\' 3"  (1.6 m)     Head Circumference --      Peak Flow --      Pain Score --      Pain Loc --      Pain Edu? --      Excl. in GC? --     Constitutional: Alert, oriented x1. Comfortable appearing and in no acute distress. Eyes: Conjunctivae are normal.  Head: Atraumatic. Nose: No congestion/rhinnorhea. Mouth/Throat: Dry mucous membranes.   Neck: Normal range of motion.  Cardiovascular: Normal rate, regular rhythm. Grossly normal heart sounds.  Good peripheral circulation. Respiratory: Normal respiratory effort.  No retractions. Lungs CTAB except trace rales to bilat bases Gastrointestinal: Soft and nontender. No distention.  Genitourinary: No CVA tenderness. Musculoskeletal: No lower extremity edema.  Extremities warm and well perfused.  Neurologic:  Normal speech and language. Motor intact in all extremities.  Skin:  Skin is warm and dry. No rash noted. Psychiatric:  Speech and behavior are  normal.  ____________________________________________   LABS (all labs ordered are listed, but only abnormal results are displayed)  Labs Reviewed  URINALYSIS, COMPLETE (UACMP) WITH MICROSCOPIC - Abnormal; Notable for the following:       Result Value   Color, Urine YELLOW (*)    APPearance CLOUDY (*)    Hgb urine dipstick SMALL (*)    Protein, ur 100 (*)    Leukocytes, UA LARGE (*)    Squamous Epithelial / LPF 0-5 (*)    All other components within normal limits  COMPREHENSIVE METABOLIC PANEL - Abnormal; Notable for the following:    Glucose, Bld 122 (*)    BUN 21 (*)    Creatinine, Ser 1.32 (*)    Calcium 8.7 (*)    AST  13 (*)    ALT 7 (*)    GFR calc non Af Amer 34 (*)    GFR calc Af Amer 39 (*)    All other components within normal limits  CBC - Abnormal; Notable for the following:    Hemoglobin 11.4 (*)    RDW 15.2 (*)    All other components within normal limits  URINE CULTURE  TROPONIN I   ____________________________________________  EKG  ED ECG REPORT I, Dionne Bucy, the attending physician, personally viewed and interpreted this ECG.  Date: 01/16/2017 EKG Time: 1706 Rate: 69 Rhythm: normal sinus rhythm QRS Axis: left axis Intervals: normal ST/T Wave abnormalities: LBBB, prolonged PR Narrative Interpretation: no evidence of acute ischemia or other acute findings  ____________________________________________  RADIOLOGY    ____________________________________________   PROCEDURES  Procedure(s) performed: No    Critical Care performed: No ____________________________________________   INITIAL IMPRESSION / ASSESSMENT AND PLAN / ED COURSE  Pertinent labs & imaging results that were available during my care of the patient were reviewed by me and considered in my medical decision making (see chart for details).  83 y/o F hx dementia, recurrent UTI p/w worsened confusion from her baseline over last 2 weeks.  Pt was seen by her PMD last  week and started on macrobid for UTI (note shows sensitivities to macrobid, cephalosporins, augmentin, cipro) but per daughter the confusion has persisted and worsened.  No other focal symptoms.  Pt is without complaint currently.  VS normal except for htn, pt comfortable appearing, exam as described.  Susp most likely confusion due to persistent/resistant UTI.  Also possible dehydration/other metabolic, low susp for cardiac cause and do not susp CNS cause given more delirium type picture and known UTI.  No focal neuro findings or abrupt change in mental status - no indication for brain imaging emergently.  Plan for labs, infection workup, fluids, and reassess.     Clinical Course as of Jan 16 2341  Wed Jan 16, 2017  [facility]: 28.6 [SS]    Clinical Course User Index [SS] Dionne Bucy, MD   ----------------------------------------- 8:08 PM on 01/16/2017 -----------------------------------------  Pt's workup negative except for grossly positive UA.  I reviewed cultures and the UTI should be susceptible to nitrofurantoin but this has not been working.  It is also susceptible to cephalosporins.  Only other significant lab abnormality is slightly elevated Cr from 1 year ago but susp this is likely chronic and should be monitored by her primary care.  At this time, pt has normal VS, no signs of sepsis, and no specific indication to be admitted - pt's daughter expresses preference that pt be discharged if possible.  Will give dose of IV ceftriaxone here and keflex rx.  Return precautions given.  Also, daughter asked me to give new rx for vigamox eye drops which pt had been on but had run out - they expressed difficulty getting in touch with the ophthalmologist in a timely manner and pt continues to have mild discharge from left eye.    ____________________________________________   FINAL CLINICAL IMPRESSION(S) / ED DIAGNOSES  Final diagnoses:  Urinary tract infection without hematuria, site  unspecified      NEW MEDICATIONS STARTED DURING THIS VISIT:  Discharge Medication List as of 01/16/2017  8:13 PM    START taking these medications   Details  cephALEXin (KEFLEX) 500 MG capsule Take 1 capsule (500 mg total) by mouth 2 (two) times daily., Starting Thu 01/17/2017, Until Sun 01/27/2017, Print  moxifloxacin (VIGAMOX) 0.5 % ophthalmic solution Place 1 drop into the left eye 3 (three) times daily., Starting Wed 01/16/2017, Until Wed 01/23/2017, Print         Note:  This document was prepared using Dragon voice recognition software and may include unintentional dictation errors.    Dionne Bucy, MD 01/16/17 2342

## 2017-01-16 NOTE — ED Notes (Signed)
E-signature pad not working. Pt's daughter signed paper hard copy. Placed in medical records pick-up area.

## 2017-01-16 NOTE — ED Notes (Addendum)
Per lab, they will add-on troponin to original blood work. 

## 2017-01-16 NOTE — ED Triage Notes (Signed)
Patient presents to ED via POV from [facility] with her daughter. Patient is a poor historian due to dementia. Daughter report patient was recently treated with macrobid for a UTI and it did not help. Daughter reports patient is not acting herself.

## 2017-01-16 NOTE — ED Notes (Signed)
Pt's daughter states that the pt has been very sleepy lately and falls asleep frequently while sitting up. Daughter thinks it might have to do with the pt getting up so early at [facility] to get ready for the day.

## 2017-01-19 LAB — URINE CULTURE: Culture: >=100000 — AB

## 2017-01-20 ENCOUNTER — Telehealth: Payer: Self-pay

## 2017-01-20 NOTE — Telephone Encounter (Signed)
Post ED Visit - Positive Culture Follow-up  Culture report reviewed by antimicrobial stewardship pharmacist:  []  Enzo BiNathan Batchelder, Pharm.D. []  Celedonio MiyamotoJeremy Frens, [address] BCPS AQ-ID []  Garvin FilaMike Maccia, Pharm.D., BCPS []  Georgina PillionElizabeth Martin, Pharm.D., BCPS []  Brick CenterMinh Pham, [address]Ph[address] BCPS, AAHIVP []  Estella HuskMichelle Turner, Pharm.D., BCPS, AAHIVP []  Lysle Pearlachel Rumbarger, PharmD, BCPS []  Casilda Carlsaylor Stone, PharmD, BCPS []  Pollyann SamplesAndy Johnston, PharmD, BCPS [facility] Center[facility] Mancheril Pharm D Positive urine culture Treated with Cephalexin, organism sensitive to the same and no further patient follow-up is required at this time.  Jerry CarasCullom, Tareva Leske Burnett 01/20/2017, 9:51 AM

## 2017-01-22 DIAGNOSIS — N183 Chronic kidney disease, stage 3 (moderate): Secondary | ICD-10-CM | POA: Diagnosis not present

## 2017-01-22 DIAGNOSIS — I10 Essential (primary) hypertension: Secondary | ICD-10-CM | POA: Diagnosis not present

## 2017-01-22 DIAGNOSIS — E784 Other hyperlipidemia: Secondary | ICD-10-CM | POA: Diagnosis not present

## 2017-01-22 DIAGNOSIS — J4540 Moderate persistent asthma, uncomplicated: Secondary | ICD-10-CM | POA: Diagnosis not present

## 2017-01-22 DIAGNOSIS — I5032 Chronic diastolic (congestive) heart failure: Secondary | ICD-10-CM | POA: Diagnosis not present

## 2017-01-22 DIAGNOSIS — R000 Tachycardia, unspecified: Secondary | ICD-10-CM | POA: Diagnosis not present

## 2017-01-22 DIAGNOSIS — R600 Localized edema: Secondary | ICD-10-CM | POA: Diagnosis not present

## 2017-01-22 DIAGNOSIS — I639 Cerebral infarction, unspecified: Secondary | ICD-10-CM | POA: Diagnosis not present

## 2017-01-22 DIAGNOSIS — F0390 Unspecified dementia without behavioral disturbance: Secondary | ICD-10-CM | POA: Diagnosis not present

## 2017-01-22 DIAGNOSIS — N390 Urinary tract infection, site not specified: Secondary | ICD-10-CM | POA: Diagnosis not present

## 2017-01-22 DIAGNOSIS — D649 Anemia, unspecified: Secondary | ICD-10-CM | POA: Diagnosis not present

## 2017-01-22 DIAGNOSIS — Z8679 Personal history of other diseases of the circulatory system: Secondary | ICD-10-CM | POA: Diagnosis not present

## 2017-01-22 DIAGNOSIS — R0602 Shortness of breath: Secondary | ICD-10-CM | POA: Diagnosis not present

## 2017-01-22 DIAGNOSIS — K219 Gastro-esophageal reflux disease without esophagitis: Secondary | ICD-10-CM | POA: Diagnosis not present

## 2017-01-22 DIAGNOSIS — K5909 Other constipation: Secondary | ICD-10-CM | POA: Diagnosis not present

## 2017-02-23 ENCOUNTER — Inpatient Hospital Stay
Admission: EM | Admit: 2017-02-23 | Discharge: 2017-02-27 | DRG: 871 | Disposition: A | Discharge status: Nursing Home (Includes ICF) | Payer: PPO | Attending: Internal Medicine | Admitting: Internal Medicine

## 2017-02-23 ENCOUNTER — Emergency Department: Payer: PPO

## 2017-02-23 ENCOUNTER — Encounter: Payer: Self-pay | Admitting: Emergency Medicine

## 2017-02-23 DIAGNOSIS — Z23 Encounter for immunization: Secondary | ICD-10-CM | POA: Diagnosis not present

## 2017-02-23 DIAGNOSIS — J45901 Unspecified asthma with (acute) exacerbation: Secondary | ICD-10-CM | POA: Diagnosis present

## 2017-02-23 DIAGNOSIS — Z66 Do not resuscitate: Secondary | ICD-10-CM | POA: Diagnosis present

## 2017-02-23 DIAGNOSIS — Z79899 Other long term (current) drug therapy: Secondary | ICD-10-CM

## 2017-02-23 DIAGNOSIS — Z881 Allergy status to other antibiotic agents status: Secondary | ICD-10-CM

## 2017-02-23 DIAGNOSIS — N390 Urinary tract infection, site not specified: Secondary | ICD-10-CM | POA: Diagnosis present

## 2017-02-23 DIAGNOSIS — Z87891 Personal history of nicotine dependence: Secondary | ICD-10-CM | POA: Diagnosis not present

## 2017-02-23 DIAGNOSIS — Z7982 Long term (current) use of aspirin: Secondary | ICD-10-CM

## 2017-02-23 DIAGNOSIS — Z888 Allergy status to other drugs, medicaments and biological substances status: Secondary | ICD-10-CM | POA: Diagnosis not present

## 2017-02-23 DIAGNOSIS — R1110 Vomiting, unspecified: Secondary | ICD-10-CM | POA: Diagnosis present

## 2017-02-23 DIAGNOSIS — M1990 Unspecified osteoarthritis, unspecified site: Secondary | ICD-10-CM | POA: Diagnosis not present

## 2017-02-23 DIAGNOSIS — Z7951 Long term (current) use of inhaled steroids: Secondary | ICD-10-CM

## 2017-02-23 DIAGNOSIS — A419 Sepsis, unspecified organism: Secondary | ICD-10-CM | POA: Diagnosis not present

## 2017-02-23 DIAGNOSIS — F0390 Unspecified dementia without behavioral disturbance: Secondary | ICD-10-CM | POA: Diagnosis present

## 2017-02-23 DIAGNOSIS — J209 Acute bronchitis, unspecified: Secondary | ICD-10-CM | POA: Diagnosis present

## 2017-02-23 DIAGNOSIS — K219 Gastro-esophageal reflux disease without esophagitis: Secondary | ICD-10-CM | POA: Diagnosis present

## 2017-02-23 DIAGNOSIS — Z885 Allergy status to narcotic agent status: Secondary | ICD-10-CM | POA: Diagnosis not present

## 2017-02-23 DIAGNOSIS — I252 Old myocardial infarction: Secondary | ICD-10-CM

## 2017-02-23 DIAGNOSIS — I10 Essential (primary) hypertension: Secondary | ICD-10-CM | POA: Diagnosis not present

## 2017-02-23 DIAGNOSIS — E785 Hyperlipidemia, unspecified: Secondary | ICD-10-CM | POA: Diagnosis present

## 2017-02-23 DIAGNOSIS — R05 Cough: Secondary | ICD-10-CM | POA: Diagnosis not present

## 2017-02-23 DIAGNOSIS — K5900 Constipation, unspecified: Secondary | ICD-10-CM | POA: Diagnosis present

## 2017-02-23 DIAGNOSIS — Z87440 Personal history of urinary (tract) infections: Secondary | ICD-10-CM | POA: Diagnosis not present

## 2017-02-23 DIAGNOSIS — I517 Cardiomegaly: Secondary | ICD-10-CM | POA: Diagnosis not present

## 2017-02-23 DIAGNOSIS — B9620 Unspecified Escherichia coli [E. coli] as the cause of diseases classified elsewhere: Secondary | ICD-10-CM | POA: Diagnosis present

## 2017-02-23 DIAGNOSIS — J441 Chronic obstructive pulmonary disease with (acute) exacerbation: Secondary | ICD-10-CM | POA: Diagnosis present

## 2017-02-23 DIAGNOSIS — R0602 Shortness of breath: Secondary | ICD-10-CM | POA: Diagnosis not present

## 2017-02-23 DIAGNOSIS — J9601 Acute respiratory failure with hypoxia: Secondary | ICD-10-CM | POA: Diagnosis present

## 2017-02-23 DIAGNOSIS — J440 Chronic obstructive pulmonary disease with acute lower respiratory infection: Secondary | ICD-10-CM | POA: Diagnosis not present

## 2017-02-23 DIAGNOSIS — Z8673 Personal history of transient ischemic attack (TIA), and cerebral infarction without residual deficits: Secondary | ICD-10-CM

## 2017-02-23 DIAGNOSIS — F329 Major depressive disorder, single episode, unspecified: Secondary | ICD-10-CM | POA: Diagnosis not present

## 2017-02-23 LAB — COMPREHENSIVE METABOLIC PANEL
ALK PHOS: 59 U/L (ref 38–126)
ALT: 8 U/L — ABNORMAL LOW (ref 14–54)
AST: 19 U/L (ref 15–41)
Albumin: 3.6 g/dL (ref 3.5–5.0)
Anion gap: 11 (ref 5–15)
BILIRUBIN TOTAL: 0.7 mg/dL (ref 0.3–1.2)
BUN: 13 mg/dL (ref 6–20)
CHLORIDE: 100 mmol/L — AB (ref 101–111)
CO2: 27 mmol/L (ref 22–32)
CREATININE: 1.08 mg/dL — AB (ref 0.44–1.00)
Calcium: 8.7 mg/dL — ABNORMAL LOW (ref 8.9–10.3)
GFR, EST AFRICAN AMERICAN: 50 mL/min — AB (ref >60)
GFR, EST NON AFRICAN AMERICAN: 43 mL/min — AB (ref >60)
Glucose, Bld: 148 mg/dL — ABNORMAL HIGH (ref 65–99)
POTASSIUM: 4 mmol/L (ref 3.5–5.1)
Sodium: 138 mmol/L (ref 135–145)
Total Protein: 7.6 g/dL (ref 6.5–8.1)

## 2017-02-23 LAB — PROTIME-INR
INR: 1.2
Prothrombin Time: 15.1 seconds (ref 11.4–15.2)

## 2017-02-23 LAB — CBC WITH DIFFERENTIAL/PLATELET
BASOS PCT: 1 %
Basophils Absolute: 0.1 10*3/uL (ref 0–0.1)
Eosinophils Absolute: 0.2 10*3/uL (ref 0–0.7)
Eosinophils Relative: 1 %
HEMATOCRIT: 38.1 % (ref 35.0–47.0)
HEMOGLOBIN: 12.8 g/dL (ref 12.0–16.0)
LYMPHS ABS: 1.2 10*3/uL (ref 1.0–3.6)
Lymphocytes Relative: 8 %
MCH: 29.3 pg (ref 26.0–34.0)
MCHC: 33.5 g/dL (ref 32.0–36.0)
MCV: 87.4 fL (ref 80.0–100.0)
MONO ABS: 1.3 10*3/uL — AB (ref 0.2–0.9)
Monocytes Relative: 9 %
NEUTROS ABS: 11.4 10*3/uL — AB (ref 1.4–6.5)
NEUTROS PCT: 81 %
Platelets: 225 10*3/uL (ref 150–440)
RBC: 4.36 MIL/uL (ref 3.80–5.20)
RDW: 15.3 % — AB (ref 11.5–14.5)
WBC: 14.2 10*3/uL — ABNORMAL HIGH (ref 3.6–11.0)

## 2017-02-23 LAB — URINALYSIS, COMPLETE (UACMP) WITH MICROSCOPIC
Bilirubin Urine: NEGATIVE
GLUCOSE, UA: NEGATIVE mg/dL
Ketones, ur: NEGATIVE mg/dL
NITRITE: POSITIVE — AB
PH: 6 (ref 5.0–8.0)
PROTEIN: 100 mg/dL — AB
SPECIFIC GRAVITY, URINE: 1.019 (ref 1.005–1.030)

## 2017-02-23 LAB — MRSA PCR SCREENING: MRSA by PCR: POSITIVE — AB

## 2017-02-23 LAB — APTT: aPTT: 33 seconds (ref 24–36)

## 2017-02-23 LAB — LACTIC ACID, PLASMA: Lactic Acid, Venous: 0.7 mmol/L (ref 0.5–1.9)

## 2017-02-23 LAB — PROCALCITONIN: Procalcitonin: <0.1 ng/mL

## 2017-02-23 LAB — MAGNESIUM: MAGNESIUM: 1.8 mg/dL (ref 1.7–2.4)

## 2017-02-23 MED ORDER — ENOXAPARIN SODIUM 40 MG/0.4ML ~~LOC~~ SOLN
40.0000 mg | SUBCUTANEOUS | Status: DC
  Administered 2017-02-23: 40 mg via SUBCUTANEOUS
  Filled 2017-02-23: qty 0.4

## 2017-02-23 MED ORDER — BISACODYL 5 MG PO TBEC: 5.0000 mg | DELAYED_RELEASE_TABLET | Freq: Every day | ORAL | Status: DC | PRN

## 2017-02-23 MED ORDER — METHYLPREDNISOLONE SODIUM SUCC 125 MG IJ SOLR
60.0000 mg | Freq: Once | INTRAMUSCULAR | Status: AC
  Administered 2017-02-23: 60 mg via INTRAVENOUS
  Filled 2017-02-23: qty 2

## 2017-02-23 MED ORDER — IPRATROPIUM-ALBUTEROL 0.5-2.5 (3) MG/3ML IN SOLN
3.0000 mL | Freq: Once | RESPIRATORY_TRACT | Status: AC
  Administered 2017-02-23: 3 mL via RESPIRATORY_TRACT
  Filled 2017-02-23: qty 3

## 2017-02-23 MED ORDER — ASPIRIN EC 81 MG PO TBEC
81.0000 mg | DELAYED_RELEASE_TABLET | Freq: Every day | ORAL | Status: DC
  Administered 2017-02-23 – 2017-02-27 (×5): 81 mg via ORAL
  Filled 2017-02-23 (×5): qty 1

## 2017-02-23 MED ORDER — MOMETASONE FUROATE 220 MCG/INH IN AEPB: 1.0000 | INHALATION_SPRAY | Freq: Two times a day (BID) | RESPIRATORY_TRACT | Status: DC

## 2017-02-23 MED ORDER — ACETAMINOPHEN 650 MG RE SUPP: 650.0000 mg | Freq: Four times a day (QID) | RECTAL | Status: DC | PRN

## 2017-02-23 MED ORDER — LOPERAMIDE HCL 2 MG PO CAPS: 4.0000 mg | ORAL_CAPSULE | ORAL | Status: DC | PRN

## 2017-02-23 MED ORDER — ALBUTEROL SULFATE (2.5 MG/3ML) 0.083% IN NEBU
2.5000 mg | INHALATION_SOLUTION | RESPIRATORY_TRACT | Status: DC | PRN
  Administered 2017-02-23 – 2017-02-24 (×2): 2.5 mg via RESPIRATORY_TRACT
  Filled 2017-02-23 (×2): qty 3

## 2017-02-23 MED ORDER — SENNOSIDES-DOCUSATE SODIUM 8.6-50 MG PO TABS
1.0000 | ORAL_TABLET | Freq: Every evening | ORAL | Status: DC | PRN
  Filled 2017-02-23: qty 1

## 2017-02-23 MED ORDER — PANTOPRAZOLE SODIUM 40 MG PO TBEC
40.0000 mg | DELAYED_RELEASE_TABLET | Freq: Every day | ORAL | Status: DC
  Administered 2017-02-24 – 2017-02-27 (×4): 40 mg via ORAL
  Filled 2017-02-23 (×4): qty 1

## 2017-02-23 MED ORDER — MONTELUKAST SODIUM 10 MG PO TABS
10.0000 mg | ORAL_TABLET | Freq: Every day | ORAL | Status: DC
  Administered 2017-02-23 – 2017-02-26 (×4): 10 mg via ORAL
  Filled 2017-02-23 (×4): qty 1

## 2017-02-23 MED ORDER — SODIUM CHLORIDE 0.9 % IV SOLN
INTRAVENOUS | Status: DC
  Administered 2017-02-23: 17:00:00 via INTRAVENOUS

## 2017-02-23 MED ORDER — BECLOMETHASONE DIPROPIONATE 80 MCG/ACT IN AERS: 2.0000 | INHALATION_SPRAY | Freq: Two times a day (BID) | RESPIRATORY_TRACT | Status: DC

## 2017-02-23 MED ORDER — GUAIFENESIN 100 MG/5ML PO SOLN
5.0000 mL | ORAL | Status: DC | PRN
  Administered 2017-02-24: 100 mg via ORAL
  Filled 2017-02-23 (×2): qty 5

## 2017-02-23 MED ORDER — LOSARTAN POTASSIUM 25 MG PO TABS
12.5000 mg | ORAL_TABLET | Freq: Every day | ORAL | Status: DC
  Administered 2017-02-23 – 2017-02-26 (×4): 12.5 mg via ORAL
  Filled 2017-02-23 (×4): qty 1

## 2017-02-23 MED ORDER — SIMVASTATIN 20 MG PO TABS
20.0000 mg | ORAL_TABLET | Freq: Every day | ORAL | Status: DC
  Administered 2017-02-23 – 2017-02-27 (×5): 20 mg via ORAL
  Filled 2017-02-23 (×5): qty 1

## 2017-02-23 MED ORDER — DEXTROSE 5 % IV SOLN
1.0000 g | INTRAVENOUS | Status: DC
  Administered 2017-02-24: 1 g via INTRAVENOUS
  Filled 2017-02-23 (×2): qty 10

## 2017-02-23 MED ORDER — ONDANSETRON HCL 4 MG/2ML IJ SOLN: 4.0000 mg | Freq: Four times a day (QID) | INTRAMUSCULAR | Status: DC | PRN

## 2017-02-23 MED ORDER — ACETAMINOPHEN 325 MG PO TABS: 650.0000 mg | ORAL_TABLET | Freq: Four times a day (QID) | ORAL | Status: DC | PRN

## 2017-02-23 MED ORDER — MAGNESIUM HYDROXIDE 400 MG/5ML PO SUSP: 30.0000 mL | Freq: Every day | ORAL | Status: DC | PRN

## 2017-02-23 MED ORDER — MUPIROCIN 2 % EX OINT
1.0000 "application " | TOPICAL_OINTMENT | Freq: Two times a day (BID) | CUTANEOUS | Status: DC
  Administered 2017-02-23 – 2017-02-27 (×8): 1 via NASAL
  Filled 2017-02-23: qty 22

## 2017-02-23 MED ORDER — LORATADINE 10 MG PO TABS
10.0000 mg | ORAL_TABLET | Freq: Every day | ORAL | Status: DC
  Administered 2017-02-23 – 2017-02-27 (×5): 10 mg via ORAL
  Filled 2017-02-23 (×5): qty 1

## 2017-02-23 MED ORDER — CEFTRIAXONE SODIUM IN DEXTROSE 20 MG/ML IV SOLN
1.0000 g | INTRAVENOUS | Status: DC
  Administered 2017-02-23: 1 g via INTRAVENOUS
  Filled 2017-02-23 (×2): qty 50

## 2017-02-23 MED ORDER — CARVEDILOL 12.5 MG PO TABS
12.5000 mg | ORAL_TABLET | Freq: Two times a day (BID) | ORAL | Status: DC
  Administered 2017-02-23 – 2017-02-27 (×8): 12.5 mg via ORAL
  Filled 2017-02-23 (×8): qty 1

## 2017-02-23 MED ORDER — SENNOSIDES-DOCUSATE SODIUM 8.6-50 MG PO TABS
1.0000 | ORAL_TABLET | Freq: Two times a day (BID) | ORAL | Status: DC
  Administered 2017-02-23 – 2017-02-27 (×7): 1 via ORAL
  Filled 2017-02-23 (×8): qty 1

## 2017-02-23 MED ORDER — FLUTICASONE PROPIONATE 50 MCG/ACT NA SUSP
2.0000 | Freq: Every day | NASAL | Status: DC
  Administered 2017-02-23 – 2017-02-27 (×5): 2 via NASAL
  Filled 2017-02-23: qty 16

## 2017-02-23 MED ORDER — ONDANSETRON HCL 4 MG PO TABS: 4.0000 mg | ORAL_TABLET | Freq: Four times a day (QID) | ORAL | Status: DC | PRN

## 2017-02-23 MED ORDER — CHLORHEXIDINE GLUCONATE CLOTH 2 % EX PADS
6.0000 | MEDICATED_PAD | Freq: Every day | CUTANEOUS | Status: DC
  Administered 2017-02-24 – 2017-02-26 (×3): 6 via TOPICAL

## 2017-02-23 MED ORDER — INFLUENZA VAC SPLIT HIGH-DOSE 0.5 ML IM SUSY
0.5000 mL | PREFILLED_SYRINGE | INTRAMUSCULAR | Status: AC
  Administered 2017-02-25: 0.5 mL via INTRAMUSCULAR
  Filled 2017-02-23: qty 0.5

## 2017-02-23 MED ORDER — IPRATROPIUM-ALBUTEROL 0.5-2.5 (3) MG/3ML IN SOLN
3.0000 mL | Freq: Four times a day (QID) | RESPIRATORY_TRACT | Status: DC
  Administered 2017-02-23: 3 mL via RESPIRATORY_TRACT
  Filled 2017-02-23: qty 3

## 2017-02-23 MED ORDER — ALBUTEROL SULFATE (2.5 MG/3ML) 0.083% IN NEBU
2.5000 mg | INHALATION_SOLUTION | Freq: Once | RESPIRATORY_TRACT | Status: AC
  Administered 2017-02-23: 2.5 mg via RESPIRATORY_TRACT
  Filled 2017-02-23: qty 3

## 2017-02-23 MED ORDER — FUROSEMIDE 20 MG PO TABS
20.0000 mg | ORAL_TABLET | Freq: Every day | ORAL | Status: DC
  Administered 2017-02-23 – 2017-02-27 (×5): 20 mg via ORAL
  Filled 2017-02-23 (×5): qty 1

## 2017-02-23 MED ORDER — CITALOPRAM HYDROBROMIDE 20 MG PO TABS
30.0000 mg | ORAL_TABLET | Freq: Every day | ORAL | Status: DC
  Administered 2017-02-23 – 2017-02-27 (×5): 30 mg via ORAL
  Filled 2017-02-23 (×5): qty 2

## 2017-02-23 MED ORDER — BUDESONIDE 0.5 MG/2ML IN SUSP
0.5000 mg | Freq: Two times a day (BID) | RESPIRATORY_TRACT | Status: DC
  Administered 2017-02-23 – 2017-02-27 (×8): 0.5 mg via RESPIRATORY_TRACT
  Filled 2017-02-23 (×8): qty 2

## 2017-02-23 MED ORDER — MOXIFLOXACIN HCL 0.5 % OP SOLN
1.0000 [drp] | Freq: Three times a day (TID) | OPHTHALMIC | Status: DC
  Administered 2017-02-23 – 2017-02-27 (×12): 1 [drp] via OPHTHALMIC
  Filled 2017-02-23 (×2): qty 3

## 2017-02-23 NOTE — Progress Notes (Signed)
Pharmacy Antibiotic Note  Daisy [name] is a 83 y.o. female admitted on 02/23/2017 with UTI.  Pharmacy has been consulted for ceftriaxone dosing.  Plan: Ceftriaxone 1 gm IV Q24H     Temp (24hrs), Avg:98.8 F (37.1 C), Min:98.6 F (37 C), Max:98.9 F (37.2 C)   Recent Labs Lab 02/23/17 1055  WBC 14.2*  CREATININE 1.08*    CrCl cannot be calculated (Unknown ideal weight.).    Allergies  Allergen Reactions  . Aricept [Donepezil Hcl] Other (See Comments)    Reaction:  Nightmares   . Codeine Other (See Comments)    Reaction:  Unknown   . Ketek [Telithromycin] Other (See Comments)    Reaction:  Mood changes     Thank you for allowing pharmacy to be a part of this patient's care.  Carola Frost, Pharm.D., BCPS Clinical Pharmacist 02/23/2017 3:54 PM

## 2017-02-23 NOTE — H&P (Addendum)
[employer] [facility] [place] at [facility]   PATIENT NAME: Daisy [name]    MR#:  [id number]  DATE OF BIRTH:  February 07, 1922  DATE OF ADMISSION:  02/23/2017  PRIMARY CARE PHYSICIAN: Kandyce Rud, MD   REQUESTING/REFERRING PHYSICIAN: Willy Eddy, MD  CHIEF COMPLAINT:   Chief Complaint  Patient presents with  . Emesis  . Cough   Shortness breath and cough for 2 days. HISTORY OF PRESENT ILLNESS:  Daisy [name]  is a 83 y.o. female with a known history of COPD, hypertension, hyperlipidemia, CVA and dementia. The patient was sent from assisted living to the ED due to above chief complaints. The patient is demented and unable to report any information. According to her daughter, the patient has had shortness breath and cough for the past 2 days. She may have mild fever. The patient was from hypoxia at 87% in room air, put on oxygen by nasal cannula. She was found urinary infection, leukocytosis and tachypnea. CXR: No active cardiopulmonary disease. No evidence of pneumonia or pulmonary edema.  PAST MEDICAL HISTORY:   Past Medical History:  Diagnosis Date  . Acute renal failure (HCC)   . Anemia   . Asthma   . Cerebral artery occlusion    with cerebral infarction  . Dementia   . Depression 06/05/2013  . Edema   . Essential hypertension, benign 06/05/2013  . GERD (gastroesophageal reflux disease) 06/05/2013  . Heart attack (HCC)   . Osteoarthritis   . Other and unspecified hyperlipidemia 06/05/2013  . Paronychia, toe   . Recurrent UTI   . Stroke [facility])     PAST SURGICAL HISTORY:   Past Surgical History:  Procedure Laterality Date  . CARDIAC SURGERY     Left BBB  . FEMUR IM NAIL Right 10/01/2015   Procedure: INTRAMEDULLARY (IM) NAIL FEMORAL;  Surgeon: Juanell Fairly, MD;  Location: [facility] ORS;  Service: Orthopedics;  Laterality: Right;  . KNEE SURGERY Right     SOCIAL HISTORY:   Social History  Substance Use Topics  . Smoking status: Former Games developer  .  Smokeless tobacco: Never Used     Comment: quit 50 + years  . Alcohol use 0.0 oz/week     Comment: occasionally    FAMILY HISTORY:   Family History  Problem Relation Age of Onset  . Prostate cancer Son   . Cancer Mother        Oral  . Colon cancer Sister   . Kidney disease Neg Hx   . Bladder Cancer Neg Hx     DRUG ALLERGIES:   Allergies  Allergen Reactions  . Aricept [Donepezil Hcl] Other (See Comments)    Reaction:  Nightmares   . Codeine Other (See Comments)    Reaction:  Unknown   . Ketek [Telithromycin] Other (See Comments)    Reaction:  Mood changes     REVIEW OF SYSTEMS:   Review of Systems  Unable to perform ROS: Dementia    MEDICATIONS AT HOME:   Prior to Admission medications   Medication Sig Start Date End Date Taking? Authorizing Provider  acetaminophen (TYLENOL) 325 MG tablet Take 650 mg by mouth every 4 (four) hours as needed for fever.   Yes [provider]  aspirin EC 81 MG tablet Take 81 mg by mouth daily.   Yes [provider]  beclomethasone (QVAR) 80 MCG/ACT inhaler Inhale 2 puffs into the lungs 2 (two) times daily.   Yes [provider]  carvedilol (COREG) 12.5 MG tablet  Take 1 tablet (12.5 mg total) by mouth 2 (two) times daily with a meal. 06/09/13  Yes Rodolph Bong, MD  cetirizine (ZYRTEC) 10 MG tablet Take 10 mg by mouth daily.   Yes [provider]  citalopram (CELEXA) 20 MG tablet Take 30 mg by mouth daily.    Yes [provider]  fluticasone (FLONASE) 50 MCG/ACT nasal spray Place 2 sprays into both nostrils daily.   Yes [provider]  furosemide (LASIX) 20 MG tablet Take 20 mg by mouth daily.    Yes [provider]  loperamide (IMODIUM A-D) 2 MG tablet Take 4 mg by mouth as needed for diarrhea or loose stools. Do not exceed 8 doses in 24 hours.   Yes [provider]  losartan (COZAAR) 25 MG tablet Take 12.5 mg by mouth daily.   Yes [provider]    magnesium hydroxide (MILK OF MAGNESIA) 400 MG/5ML suspension Take 30 mLs by mouth daily as needed for mild constipation.   Yes [provider]  mometasone (ASMANEX) 220 MCG/INH inhaler Inhale 1 puff into the lungs 2 (two) times daily.   Yes [provider]  montelukast (SINGULAIR) 10 MG tablet Take 10 mg by mouth at bedtime.   Yes [provider]  moxifloxacin (VIGAMOX) 0.5 % ophthalmic solution Place 1 drop into the left eye 3 (three) times daily.   Yes [provider]  nystatin cream (MYCOSTATIN) Apply 1 application topically 2 (two) times daily.   Yes [provider]  omeprazole (PRILOSEC) 20 MG capsule Take 20 mg by mouth daily before breakfast.    Yes [provider]  senna-docusate (SENOKOT-S) 8.6-50 MG tablet Take 1 tablet by mouth 2 (two) times daily.    Yes [provider]  simvastatin (ZOCOR) 20 MG tablet Take 20 mg by mouth daily.    Yes [provider]  vitamin B-12 (CYANOCOBALAMIN) 1000 MCG tablet Take 1,000 mcg by mouth daily.   Yes [provider]      VITAL SIGNS:  Blood pressure 120/76, pulse 86, temperature 98.9 F (37.2 C), temperature source Oral, resp. rate 18, SpO2 (!) 88 %.  PHYSICAL EXAMINATION:  Physical Exam  GENERAL:  83 y.o.-year-old patient lying in the bed with no acute distress.  EYES: Pupils equal, round, reactive to light and accommodation. No scleral icterus. Extraocular muscles intact.  HEENT: Head atraumatic, normocephalic. Oropharynx and nasopharynx clear.  NECK:  Supple, no jugular venous distention. No thyroid enlargement, no tenderness.  LUNGS: Normal breath sounds bilaterally, no wheezing, but has rhonchi. No use of accessory muscles of respiration.  CARDIOVASCULAR: S1, S2 normal. No murmurs, rubs, or gallops.  ABDOMEN: Soft, nontender, nondistended. Bowel sounds present. No organomegaly or mass.  EXTREMITIES: No pedal edema, cyanosis, or clubbing.  NEUROLOGIC: not  follow commands, unable to exam. PSYCHIATRIC: The patient is Demented SKIN: No obvious rash, lesion, or ulcer.   LABORATORY PANEL:   CBC  Recent Labs Lab 02/23/17 1055  WBC 14.2*  HGB 12.8  HCT 38.1  PLT 225   ------------------------------------------------------------------------------------------------------------------  Chemistries   Recent Labs Lab 02/23/17 1055  NA 138  K 4.0  CL 100*  CO2 27  GLUCOSE 148*  BUN 13  CREATININE 1.08*  CALCIUM 8.7*  AST 19  ALT 8*  ALKPHOS 59  BILITOT 0.7   ------------------------------------------------------------------------------------------------------------------  Cardiac Enzymes No results for input(s): TROPONINI in the last 168 hours. ------------------------------------------------------------------------------------------------------------------  RADIOLOGY:  Dg Chest 2 View  Result Date: 02/23/2017 CLINICAL DATA:  Emesis, congestion an UGI symptoms. EXAM: CHEST  2 VIEW COMPARISON:  Chest x-rays dated 01/16/2017 and 09/30/2015. FINDINGS: Cardiomegaly is stable. Atherosclerotic changes again noted at the aortic arch. Prominent pulmonary arteries again noted suggesting chronic pulmonary artery hypertension. Lungs are clear. No pleural effusion or pneumothorax. Probable bibasilar bronchiectasis. No acute or suspicious osseous finding. IMPRESSION: 1. No active cardiopulmonary disease. No evidence of pneumonia or pulmonary edema. 2.  Aortic atherosclerosis. 3.  Stable cardiomegaly. 4.  Probable bibasilar bronchiectasis. 5.  Probable chronic pulmonary artery hypertension. Electronically Signed   By: Bary Richard M.D.   On: 02/23/2017 11:33      IMPRESSION AND PLAN:   Sepsis due to UTI. The patient will be admitted to medical floor. Start sepsis protocol, continue Rocephin, follow-up CBC, lactic acid level and cultures.  Acute respiratory failure with hypoxia due to acute bronchitis with history of COPD/asthma. Continue  oxygen by nasal cannular, DuoNeb every 6 hours, continue home nebulizer. Antibiotics as above.  Hypertension. Continue hypertension medication. Dementia. Aspiration precaution and fall precaution.  All the records are reviewed and case discussed with ED provider. Management plans discussed with the patient's daughter  POA and they are in agreement.  CODE STATUS: DO NOT RESUSCITATE  TOTAL TIME TAKING CARE OF THIS PATIENT: 55 minutes.    Shaune Pollack M.D on 02/23/2017 at 2:33 PM  Between 7am to 6pm - Pager - [phone]  After 6pm go to [email or web address] - Social research officer, government  [facility]  Office  [phone]  CC: Primary care physician; Kandyce Rud, MD   Note: This dictation was prepared with Dragon dictation along with smaller phrase technology. Any transcriptional errors that result from this process are unin

## 2017-02-23 NOTE — ED Provider Notes (Signed)
[facility] Emergency Department Provider Note    First MD Initiated Contact with Patient 02/23/17 1047     (approximate)  I have reviewed the triage vital signs and the nursing notes.   HISTORY  Chief Complaint Emesis and Cough    HPI Daisy [name] is a 83 y.o. female presents from nursing facility with chief complaint of congestion and emesis confusion and foul-smelling dark urine. Patient with recurrent urinary tract infections. Most recent one culture grew out Proteus that was sensitive to cephalosporins. She arrives to the ER with family and her healthcare power of attorney stating that she has also been having a nonproductive cough. Does not wear oxygen at baseline. No lower extremity swelling. Did have decreased oral intake yesterday. Has a history of dementia limiting history.   Past Medical History:  Diagnosis Date  . Acute renal failure (HCC)   . Anemia   . Asthma   . Cerebral artery occlusion    with cerebral infarction  . Dementia   . Depression 06/05/2013  . Edema   . Essential hypertension, benign 06/05/2013  . GERD (gastroesophageal reflux disease) 06/05/2013  . Heart attack (HCC)   . Osteoarthritis   . Other and unspecified hyperlipidemia 06/05/2013  . Paronychia, toe   . Recurrent UTI   . Stroke [facility])    Family History  Problem Relation Age of Onset  . Prostate cancer Son   . Cancer Mother        Oral  . Colon cancer Sister   . Kidney disease Neg Hx   . Bladder Cancer Neg Hx    Past Surgical History:  Procedure Laterality Date  . CARDIAC SURGERY     Left BBB  . FEMUR IM NAIL Right 10/01/2015   Procedure: INTRAMEDULLARY (IM) NAIL FEMORAL;  Surgeon: Juanell Fairly, MD;  Location: [facility] ORS;  Service: Orthopedics;  Laterality: Right;  . KNEE SURGERY Right    Patient Active Problem List   Diagnosis Date Noted  . Rectal bleed 11/11/2015  . Dementia 11/11/2015  . COPD (chronic obstructive pulmonary disease) (HCC)  11/11/2015  . Dyslipidemia 11/11/2015  . Infection of urinary tract 10/04/2015  . Escherichia coli (E. coli) infection 10/04/2015  . CKD (chronic kidney disease), stage III (HCC) 10/04/2015  . Hyperglycemia 10/04/2015  . MRSA carrier 10/04/2015  . Leukocytosis 10/04/2015  . Thrombocytopenia (HCC) 10/04/2015  . Closed right hip fracture (HCC) 09/30/2015  . Recurrent UTI 11/12/2014  . Microscopic hematuria 11/12/2014  . Atrophic vaginitis 11/12/2014  . CVA (cerebral infarction) 06/05/2013  . Hypotension, unspecified 06/05/2013  . Other and unspecified hyperlipidemia 06/05/2013  . Acute renal failure (HCC) 06/05/2013  . Leukocytosis, unspecified 06/05/2013  . Essential hypertension, benign 06/05/2013  . Depression 06/05/2013  . GERD (gastroesophageal reflux disease) 06/05/2013      Prior to Admission medications   Medication Sig Start Date End Date Taking? Authorizing Provider  acetaminophen (TYLENOL) 325 MG tablet Take 650 mg by mouth every 4 (four) hours as needed for fever.   Yes [provider]  aspirin EC 81 MG tablet Take 81 mg by mouth daily.   Yes [provider]  beclomethasone (QVAR) 80 MCG/ACT inhaler Inhale 2 puffs into the lungs 2 (two) times daily.   Yes [provider]  carvedilol (COREG) 12.5 MG tablet Take 1 tablet (12.5 mg total) by mouth 2 (two) times daily with a meal. 06/09/13  Yes Rodolph Bong, MD  cetirizine (ZYRTEC) 10 MG tablet Take 10 mg  by mouth daily.   Yes [provider]  citalopram (CELEXA) 20 MG tablet Take 30 mg by mouth daily.    Yes [provider]  fluticasone (FLONASE) 50 MCG/ACT nasal spray Place 2 sprays into both nostrils daily.   Yes [provider]  furosemide (LASIX) 20 MG tablet Take 20 mg by mouth daily.    Yes [provider]  loperamide (IMODIUM A-D) 2 MG tablet Take 4 mg by mouth as needed for diarrhea or loose stools. Do not exceed 8 doses in 24 hours.   Yes [provider]  losartan (COZAAR) 25 MG tablet Take 12.5 mg by mouth daily.   Yes [provider]  magnesium hydroxide (MILK OF MAGNESIA) 400 MG/5ML suspension Take 30 mLs by mouth daily as needed for mild constipation.   Yes [provider]  mometasone (ASMANEX) 220 MCG/INH inhaler Inhale 1 puff into the lungs 2 (two) times daily.   Yes [provider]  montelukast (SINGULAIR) 10 MG tablet Take 10 mg by mouth at bedtime.   Yes [provider]  moxifloxacin (VIGAMOX) 0.5 % ophthalmic solution Place 1 drop into the left eye 3 (three) times daily.   Yes [provider]  nystatin cream (MYCOSTATIN) Apply 1 application topically 2 (two) times daily.   Yes [provider]  omeprazole (PRILOSEC) 20 MG capsule Take 20 mg by mouth daily before breakfast.    Yes [provider]  senna-docusate (SENOKOT-S) 8.6-50 MG tablet Take 1 tablet by mouth 2 (two) times daily.    Yes [provider]  simvastatin (ZOCOR) 20 MG tablet Take 20 mg by mouth daily.    Yes [provider]  vitamin B-12 (CYANOCOBALAMIN) 1000 MCG tablet Take 1,000 mcg by mouth daily.   Yes [provider]    Allergies Aricept [donepezil hcl]; Codeine; and Ketek [telithromycin]    Social History Social History  Substance Use Topics  . Smoking status: Former Games developer  . Smokeless tobacco: Never Used     Comment: quit 50 + years  . Alcohol use 0.0 oz/week     Comment: occasionally    Review of Systems Patient denies headaches, rhinorrhea, blurry vision, numbness, shortness of breath, chest pain, edema, cough, abdominal pain, nausea, vomiting, diarrhea, dysuria, fevers, rashes or hallucinations unless otherwise stated above in HPI. ____________________________________________   PHYSICAL EXAM:  VITAL SIGNS: Vitals:   02/23/17 1353 02/23/17 1400  BP:  120/76  Pulse: 87 86  Resp: (!) 22 18  Temp:    SpO2: (!) 87% (!) 88%     Constitutional: Alert, chronically ill appearing, in no acute distress. Eyes: Conjunctivae are normal.  Head: Atraumatic. Nose: No congestion/rhinnorhea. Mouth/Throat: Mucous membranes are moist.   Neck: No stridor. Painless ROM.  Cardiovascular: Normal rate, regular rhythm. Grossly normal heart sounds.  Good peripheral circulation. Respiratory: Normal respiratory effort.  No retractions. Lungs with coarse breathsounds throughout Gastrointestinal: Soft and nontender. No distention. No abdominal bruits. No CVA tenderness. Musculoskeletal: No lower extremity tenderness nor edema.  No joint effusions. Neurologic:  Normal speech  No gross focal neurologic deficits are appreciated. No facial droop Skin:  Skin is warm, dry and intact. No rash noted. Psychiatric: Mood and affect are normal. Speech and behavior are normal.  ____________________________________________   LABS (all labs ordered are listed, but only abnormal results are displayed)  Results for orders placed or performed during the hospital encounter of 02/23/17 (from the past 24 hour(s))  CBC with Differential/Platelet  Status: Abnormal   Collection Time: 02/23/17 10:55 AM  Result Value Ref Range   WBC 14.2 (H) 3.6 - 11.0 K/uL   RBC 4.36 3.80 - 5.20 MIL/uL   Hemoglobin 12.8 12.0 - 16.0 g/dL   HCT 16.1 09.6 - 04.5 %   MCV 87.4 80.0 - 100.0 fL   MCH 29.3 26.0 - 34.0 pg   MCHC 33.5 32.0 - 36.0 g/dL   RDW 40.9 (H) 81.1 - 91.4 %   Platelets 225 150 - 440 K/uL   Neutrophils Relative % 81 %   Neutro Abs 11.4 (H) 1.4 - 6.5 K/uL   Lymphocytes Relative 8 %   Lymphs Abs 1.2 1.0 - 3.6 K/uL   Monocytes Relative 9 %   Monocytes Absolute 1.3 (H) 0.2 - 0.9 K/uL   Eosinophils Relative 1 %   Eosinophils Absolute 0.2 0 - 0.7 K/uL   Basophils Relative 1 %   Basophils Absolute 0.1 0 - 0.1 K/uL  Comprehensive metabolic panel     Status: Abnormal   Collection Time: 02/23/17 10:55 AM  Result Value Ref Range   Sodium 138 135 - 145  mmol/L   Potassium 4.0 3.5 - 5.1 mmol/L   Chloride 100 (L) 101 - 111 mmol/L   CO2 27 22 - 32 mmol/L   Glucose, Bld 148 (H) 65 - 99 mg/dL   BUN 13 6 - 20 mg/dL   Creatinine, Ser 7.82 (H) 0.44 - 1.00 mg/dL   Calcium 8.7 (L) 8.9 - 10.3 mg/dL   Total Protein 7.6 6.5 - 8.1 g/dL   Albumin 3.6 3.5 - 5.0 g/dL   AST 19 15 - 41 U/L   ALT 8 (L) 14 - 54 U/L   Alkaline Phosphatase 59 38 - 126 U/L   Total Bilirubin 0.7 0.3 - 1.2 mg/dL   GFR calc non Af Amer 43 (L) >60 mL/min   GFR calc Af Amer 50 (L) >60 mL/min   Anion gap 11 5 - 15  Urinalysis, Complete w Microscopic     Status: Abnormal   Collection Time: 02/23/17 12:47 PM  Result Value Ref Range   Color, Urine YELLOW (A) YELLOW   APPearance TURBID (A) CLEAR   Specific Gravity, Urine 1.019 1.005 - 1.030   pH 6.0 5.0 - 8.0   Glucose, UA NEGATIVE NEGATIVE mg/dL   Hgb urine dipstick SMALL (A) NEGATIVE   Bilirubin Urine NEGATIVE NEGATIVE   Ketones, ur NEGATIVE NEGATIVE mg/dL   Protein, ur 956 (A) NEGATIVE mg/dL   Nitrite POSITIVE (A) NEGATIVE   Leukocytes, UA LARGE (A) NEGATIVE   RBC / HPF 6-30 0 - 5 RBC/hpf   WBC, UA TOO NUMEROUS TO COUNT 0 - 5 WBC/hpf   Bacteria, UA MANY (A) NONE SEEN   Squamous Epithelial / LPF 0-5 (A) NONE SEEN   WBC Clumps PRESENT    ____________________________________________  EKG My review and personal interpretation at Time: 10:51   Indication: ams  Rate: 90  Rhythm: sinus  Axis: left Other: prolonged pr with occasional PAC, no sgarbossa ____________________________________________  RADIOLOGY  I personally reviewed all radiographic images ordered to evaluate for the above acute complaints and reviewed radiology reports and findings.  These findings were personally discussed with the patient.  Please see medical record for radiology report.  ____________________________________________   PROCEDURES  Procedure(s) performed:  Procedures    Critical Care performed: yes CRITICAL CARE Performed by:  Willy Eddy   Total critical care time: 30 minutes  Critical care time was exclusive  of separately billable procedures and treating other patients.  Critical care was necessary to treat or prevent imminent or life-threatening deterioration.  Critical care was time spent personally by me on the following activities: development of treatment plan with patient and/or surrogate as well as nursing, discussions with consultants, evaluation of patient's response to treatment, examination of patient, obtaining history from patient or surrogate, ordering and performing treatments and interventions, ordering and review of laboratory studies, ordering and review of radiographic studies, pulse oximetry and re-evaluation of patient's condition.  ____________________________________________   INITIAL IMPRESSION / ASSESSMENT AND PLAN / ED COURSE  Pertinent labs & imaging results that were available during my care of the patient were reviewed by me and considered in my medical decision making (see chart for details).  DDX: uti, pna, chf, copd, sepsis  Daisy [name] is a 83 y.o. who presents to the ED with Confusion dark and foul-smelling urine. Patient afebrile and otherwise hemodynamic stable. She does arrive on supplemental oxygen and reportedly is not on oxygen at home. Does have some wheezing. We'll give doing labs and reassess. We'll send blood work for the above differential. Chest x-ray ordered to evaluate for pneumonia or CHF shows no acute abnormality. Her abdominal exam is soft and benign. Do suspect she has a urinary tract infection.  The patient will be placed on continuous pulse oximetry and telemetry for monitoring.  Laboratory evaluation will be sent to evaluate for the above complaints.     Clinical Course as of Feb 24 1412  Sat Feb 23, 2017  1331 UA showed evidence of urinary tract infection. We'll give dose of IV Rocephin and continue to monitor patient and she is been afebrile  hemodynamic stable. She was placed on nasal cannula due to single reading of hypoxia. She does not wear oxygen at home. Does not appear to be in any respiratory distress. I have removed the nasal cannula and will observe her.  [PR]  1407 patient desaturates down to 87%. Does still have some wheezing on exam therefore we will give dose of Solu-Medrol as well as continuous breathing treatments. Based on her frailty with acute respiratory failure with hypoxia do believe the patient benefit from admission for further evaluation and management.  [PR]    Clinical Course User Index [PR] Willy Eddy, MD     ____________________________________________   FINAL CLINICAL IMPRESSION(S) / ED DIAGNOSES  Final diagnoses:  Urinary tract infection without hematuria, site unspecified  Acute respiratory failure with hypoxia (HCC)      NEW MEDICATIONS STARTED DURING THIS VISIT:  New Prescriptions   No medications on file     Note:  This document was prepared using Dragon voice recognition software and may include unintentional dictation errors.    Willy Eddy, MD 02/23/17 [phone]

## 2017-02-23 NOTE — Clinical Social Work Note (Signed)
Clinical Social Work Assessment  Patient Details  Name: Daisy [name] MRN: [id number] Date of Birth: 18-Oct-1921  Date of referral:  02/23/17               Reason for consult:  Other (Comment Required) (From [facility] )                Permission sought to share information with:  Oceanographer granted to share information::  Yes, Verbal Permission Granted  Name::      [facility]   Agency::     Relationship::     Contact Information:     Housing/Transportation Living arrangements for the past 2 months:  Assisted Living Facility Source of Information:  Adult Children, Facility Patient Interpreter Needed:  None Criminal Activity/Legal Involvement Pertinent to Current Situation/Hospitalization:  No - Comment as needed Significant Relationships:  Adult Children Lives with:  Facility Resident Do you feel safe going back to the place where you live?    Need for family participation in patient care:  Yes (Comment)  Care giving concerns:  Patient is a resident at [facility] (fax: [phone]).    Social Worker assessment / plan:  Visual merchandiser (CSW) reviewed chart and noted that patient is from [facility] and is being admitted to a medical unit from the ED. Per chart patient is not alert and oriented. CSW [place] [facility] [place] and spoke to [employer]. Per [name] patient has been a resident since 12/02/15 and primarily uses a wheel chair. Per [name] patient can transfer with assistance from bed to wheel chair. Per [name] patient is on room air at baseline and has an inhaler. CSW made [name] aware that patient was being admitted to [facility]. Per [name] patient can return to [facility] if she is at her baseline. CSW attempted to contact patient's daughter [name] however she did not answer and a voicemail was left. CSW contacted patient's daughter [name]. Per [name] patient has 7 adult children. Per Aggie Cosier is the first person listed on HPOA and [name] is the  second. HPOA document has been scanned into chart under the media tab in EPIC from previous admission. Per [name] patient has 24/7 care and is never left alone. Per [name] patient does require assistance to transfer from the wheel chair. [name] is agreeable for patient to return to [facility] when medically stable. CSW will continue to follow and assist as needed.   Employment status:  Disabled (Comment on whether or not currently receiving Disability), Retired Database administrator PT Recommendations:  Not assessed at this time Information / Referral to community resources:  Other (Comment Required) (ALF VS. SNF )  Patient/Family's Response to care:  Daughter [name] is agreeable for patient to return to [facility].   Patient/Family's Understanding of and Emotional Response to Diagnosis, Current Treatment, and Prognosis:  Patient's daughter [name] was very pleasant and thanked CSW for assistance.   Emotional Assessment Appearance:  Appears stated age Attitude/Demeanor/Rapport:  Unable to Assess Affect (typically observed):  Unable to Assess Orientation:  Oriented to Self, Fluctuating Orientation (Suspected and/or reported Sundowners) Alcohol / Substance use:  Not Applicable Psych involvement (Current and /or in the community):  No (Comment)  Discharge Needs  Concerns to be addressed:  Discharge Planning Concerns Readmission within the last 30 days:  No Current discharge risk:  Dependent with Mobility, Cognitively Impaired Barriers to Discharge:  Continued Medical Work up  Lucius Wise, Darleen Crocker, LCSW 02/23/2017, 3:06 PM

## 2017-02-23 NOTE — ED Notes (Addendum)
Pt placed on 3L O2 via [place], pt desat to 88% on RA. MD notified.

## 2017-02-23 NOTE — ED Notes (Signed)
Patient taken off of oxygen to trial on room air per MD. Will continue to monitor oxygen saturation.

## 2017-02-23 NOTE — ED Triage Notes (Signed)
Pt to ED via POV from [facility] for emesis, congestion, and UTI symptoms. Pt denies any pain at this time. Pt son not able to provide history at this time.

## 2017-02-23 NOTE — NC FL2 (Signed)
[place] MEDICAID FL2 LEVEL OF CARE SCREENING TOOL     IDENTIFICATION  Patient Name: Daisy [name] Birthdate: 11-28-1921 Sex: female Admission Date (Current Location): 02/23/2017  [facility] and [place] Number:  Chiropodist and Address:  [facility], [address], [place], [place] [address]      Provider Number: [id number]  Attending Physician Name and Address:  Shaune Pollack, MD  Relative Name and Phone Number:       Current Level of Care: Hospital Recommended Level of Care: Assisted Living Facility Prior Approval Number:    Date Approved/Denied:   PASRR Number:  ([id number] )  Discharge Plan: Domiciliary (Rest home)    Current Diagnoses: Patient Active Problem List   Diagnosis Date Noted  . Sepsis (HCC) 02/23/2017  . Rectal bleed 11/11/2015  . Dementia 11/11/2015  . COPD (chronic obstructive pulmonary disease) (HCC) 11/11/2015  . Dyslipidemia 11/11/2015  . Infection of urinary tract 10/04/2015  . Escherichia coli (E. coli) infection 10/04/2015  . CKD (chronic kidney disease), stage III (HCC) 10/04/2015  . Hyperglycemia 10/04/2015  . MRSA carrier 10/04/2015  . Leukocytosis 10/04/2015  . Thrombocytopenia (HCC) 10/04/2015  . Closed right hip fracture (HCC) 09/30/2015  . Recurrent UTI 11/12/2014  . Microscopic hematuria 11/12/2014  . Atrophic vaginitis 11/12/2014  . CVA (cerebral infarction) 06/05/2013  . Hypotension, unspecified 06/05/2013  . Other and unspecified hyperlipidemia 06/05/2013  . Acute renal failure (HCC) 06/05/2013  . Leukocytosis, unspecified 06/05/2013  . Essential hypertension, benign 06/05/2013  . Depression 06/05/2013  . GERD (gastroesophageal reflux disease) 06/05/2013    Orientation RESPIRATION BLADDER Height & Weight     Self  Normal Continent Weight:   Height:     BEHAVIORAL SYMPTOMS/MOOD NEUROLOGICAL BOWEL NUTRITION STATUS      Continent Diet (Regular Diet )  AMBULATORY STATUS COMMUNICATION OF  NEEDS Skin   Extensive Assist (Uses a wheel chair. ) Verbally Normal                       Personal Care Assistance Level of Assistance  Bathing, Feeding, Dressing Bathing Assistance: Limited assistance Feeding assistance: Independent Dressing Assistance: Limited assistance     Functional Limitations Info  Sight, Hearing, Speech Sight Info: Adequate Hearing Info: Adequate Speech Info: Adequate    SPECIAL CARE FACTORS FREQUENCY  PT (By licensed PT)     PT Frequency:  (2-3 days per week Home Health. )              Contractures      Additional Factors Info  Code Status, Allergies, Isolation Precautions Code Status Info:  (Full Code. ) Allergies Info:  (Aricept Donepezil Hcl, Codeine, Ketek Telithromycin)     Isolation Precautions Info:  (MRSA Nasal Swab. )     Current Medications (02/23/2017):  This is the current hospital active medication list Current Facility-Administered Medications  Medication Dose Route Frequency Provider Last Rate Last Dose  . cefTRIAXone (ROCEPHIN) 1 g in dextrose 5 % 50 mL IVPB - Premix  1 g Intravenous Q24H Willy Eddy, MD   Stopped at 02/23/17 1402   Current Outpatient Prescriptions  Medication Sig Dispense Refill  . acetaminophen (TYLENOL) 325 MG tablet Take 650 mg by mouth every 4 (four) hours as needed for fever.    Marland Kitchen aspirin EC 81 MG tablet Take 81 mg by mouth daily.    . beclomethasone (QVAR) 80 MCG/ACT inhaler Inhale 2 puffs into the lungs 2 (two) times daily.    Marland Kitchen  carvedilol (COREG) 12.5 MG tablet Take 1 tablet (12.5 mg total) by mouth 2 (two) times daily with a meal. 62 tablet 0  . cetirizine (ZYRTEC) 10 MG tablet Take 10 mg by mouth daily.    . citalopram (CELEXA) 20 MG tablet Take 30 mg by mouth daily.     . fluticasone (FLONASE) 50 MCG/ACT nasal spray Place 2 sprays into both nostrils daily.    . furosemide (LASIX) 20 MG tablet Take 20 mg by mouth daily.     Marland Kitchen loperamide (IMODIUM A-D) 2 MG tablet Take 4 mg by mouth as  needed for diarrhea or loose stools. Do not exceed 8 doses in 24 hours.    Marland Kitchen losartan (COZAAR) 25 MG tablet Take 12.5 mg by mouth daily.    . magnesium hydroxide (MILK OF MAGNESIA) 400 MG/5ML suspension Take 30 mLs by mouth daily as needed for mild constipation.    . mometasone (ASMANEX) 220 MCG/INH inhaler Inhale 1 puff into the lungs 2 (two) times daily.    . montelukast (SINGULAIR) 10 MG tablet Take 10 mg by mouth at bedtime.    . moxifloxacin (VIGAMOX) 0.5 % ophthalmic solution Place 1 drop into the left eye 3 (three) times daily.    Marland Kitchen nystatin cream (MYCOSTATIN) Apply 1 application topically 2 (two) times daily.    Marland Kitchen omeprazole (PRILOSEC) 20 MG capsule Take 20 mg by mouth daily before breakfast.     . senna-docusate (SENOKOT-S) 8.6-50 MG tablet Take 1 tablet by mouth 2 (two) times daily.     . simvastatin (ZOCOR) 20 MG tablet Take 20 mg by mouth daily.     . vitamin B-12 (CYANOCOBALAMIN) 1000 MCG tablet Take 1,000 mcg by mouth daily.       Discharge Medications: Please see discharge summary for a list of discharge medications.  Relevant Imaging Results:  Relevant Lab Results:   Additional Information  (SSN: [id number])  Maclane Holloran, Darleen Crocker, LCSW

## 2017-02-23 NOTE — ED Notes (Signed)
Pt's daughter reports pt has been congested for a couple of days, more confused, pt's daughter reports she thinks pt may have a UTI, pt has dementia poor historian

## 2017-02-24 LAB — BASIC METABOLIC PANEL
ANION GAP: 7 (ref 5–15)
BUN: 17 mg/dL (ref 6–20)
CALCIUM: 8.6 mg/dL — AB (ref 8.9–10.3)
CO2: 30 mmol/L (ref 22–32)
Chloride: 104 mmol/L (ref 101–111)
Creatinine, Ser: 1.22 mg/dL — ABNORMAL HIGH (ref 0.44–1.00)
GFR calc Af Amer: 43 mL/min — ABNORMAL LOW (ref >60)
GFR, EST NON AFRICAN AMERICAN: 37 mL/min — AB (ref >60)
GLUCOSE: 170 mg/dL — AB (ref 65–99)
POTASSIUM: 4.2 mmol/L (ref 3.5–5.1)
SODIUM: 141 mmol/L (ref 135–145)

## 2017-02-24 LAB — CBC
HEMATOCRIT: 36.3 % (ref 35.0–47.0)
HEMOGLOBIN: 11.9 g/dL — AB (ref 12.0–16.0)
MCH: 28.3 pg (ref 26.0–34.0)
MCHC: 32.6 g/dL (ref 32.0–36.0)
MCV: 86.7 fL (ref 80.0–100.0)
Platelets: 216 10*3/uL (ref 150–440)
RBC: 4.19 MIL/uL (ref 3.80–5.20)
RDW: 15.1 % — ABNORMAL HIGH (ref 11.5–14.5)
WBC: 8.3 10*3/uL (ref 3.6–11.0)

## 2017-02-24 MED ORDER — IPRATROPIUM-ALBUTEROL 0.5-2.5 (3) MG/3ML IN SOLN
3.0000 mL | Freq: Three times a day (TID) | RESPIRATORY_TRACT | Status: DC
  Administered 2017-02-24 – 2017-02-27 (×11): 3 mL via RESPIRATORY_TRACT
  Filled 2017-02-24 (×11): qty 3

## 2017-02-24 MED ORDER — GUAIFENESIN ER 600 MG PO TB12
600.0000 mg | ORAL_TABLET | Freq: Two times a day (BID) | ORAL | Status: DC
  Administered 2017-02-24 – 2017-02-27 (×6): 600 mg via ORAL
  Filled 2017-02-24 (×6): qty 1

## 2017-02-24 MED ORDER — ENOXAPARIN SODIUM 30 MG/0.3ML ~~LOC~~ SOLN
30.0000 mg | SUBCUTANEOUS | Status: DC
  Administered 2017-02-24 – 2017-02-26 (×3): 30 mg via SUBCUTANEOUS
  Filled 2017-02-24 (×3): qty 0.3

## 2017-02-24 MED ORDER — METHYLPREDNISOLONE SODIUM SUCC 125 MG IJ SOLR
60.0000 mg | Freq: Two times a day (BID) | INTRAMUSCULAR | Status: DC
  Administered 2017-02-24 – 2017-02-26 (×5): 60 mg via INTRAVENOUS
  Filled 2017-02-24 (×5): qty 2

## 2017-02-24 NOTE — Progress Notes (Signed)
[facility] [place] [facility]                                                                                                                                                                                  Patient Demographics   Daisy [name], is a 83 y.o. female, DOB - 1921/12/14, UJW:[id number]  Admit date - 02/23/2017   Admitting Physician Shaune Pollack, MD  Outpatient Primary MD for the patient is Kandyce Rud, MD   LOS - 1  Subjective: Since feeling better. Shortness of breath improved. According to the daughter she still has some congestion     Review of Systems:   CONSTITUTIONAL: No documented fever. No fatigue, weakness. No weight gain, no weight loss.  EYES: No blurry or double vision.  ENT: No tinnitus. No postnasal drip. No redness of the oropharynx.  RESPIRATORY: positive cough, no wheeze, no hemoptysis. positive dyspnea.  CARDIOVASCULAR: No chest pain. No orthopnea. No palpitations. No syncope.  GASTROINTESTINAL: No nausea, no vomiting or diarrhea. No abdominal pain. No melena or hematochezia.  GENITOURINARY: No dysuria or hematuria.  ENDOCRINE: No polyuria or nocturia. No heat or cold intolerance.  HEMATOLOGY: No anemia. No bruising. No bleeding.  INTEGUMENTARY: No rashes. No lesions.  MUSCULOSKELETAL: No arthritis. No swelling. No gout.  NEUROLOGIC: No numbness, tingling, or ataxia. No seizure-type activity.  PSYCHIATRIC: No anxiety. No insomnia. No ADD.    Vitals:   Vitals:   02/24/17 0330 02/24/17 0331 02/24/17 0335 02/24/17 0915  BP: (!) 185/100 (!) 162/75  (!) 154/70  Pulse: 70 69  65  Resp:    16  Temp:   (!) 97.3 F (36.3 C) 98.1 F (36.7 C)  TempSrc:   Oral Oral  SpO2: 100% 100%  97%  Weight:      Height:        Wt Readings from Last 3 Encounters:  02/23/17 177 lb (80.3 kg)  01/16/17 170 lb (77.1 kg)  11/11/15 186 lb 8 oz (84.6 kg)     Intake/Output Summary (Last 24 hours) at 02/24/17 1207 Last data filed at 02/24/17  0300  Gross per 24 hour  Intake            812.5 ml  Output                8 ml  Net            804.5 ml    Physical Exam:   GENERAL: Pleasant-appearing in no apparent distress.  HEAD, EYES, EARS, NOSE AND THROAT: Atraumatic, normocephalic. Extraocular muscles are intact. Pupils equal and reactive to light. Sclerae anicteric. No conjunctival injection. No oro-pharyngeal erythema.  NECK: Supple.  There is no jugular venous distention. No bruits, no lymphadenopathy, no thyromegaly.  HEART: Regular rate and rhythm,. No murmurs, no rubs, no clicks.  LUNGS:bilateral wheezing throughout both lungs no assesory muscle usage ABDOMEN: Soft, flat, nontender, nondistended. Has good bowel sounds. No hepatosplenomegaly appreciated.  EXTREMITIES: No evidence of any cyanosis, clubbing, or peripheral edema.  +2 pedal and radial pulses bilaterally.  NEUROLOGIC: The patient is alert, awake, and oriented x3 with no focal motor or sensory deficits appreciated bilaterally.  SKIN: Moist and warm with no rashes appreciated.  Psych: Not anxious, depressed LN: No inguinal LN enlargement    Antibiotics   Anti-infectives    Start     Dose/Rate Route Frequency Ordered Stop   02/24/17 1330  cefTRIAXone (ROCEPHIN) 1 g in dextrose 5 % 50 mL IVPB     1 g 100 mL/hr over 30 Minutes Intravenous Every 24 hours 02/23/17 1634     02/23/17 1330  cefTRIAXone (ROCEPHIN) 1 g in dextrose 5 % 50 mL IVPB - Premix  Status:  Discontinued     1 g 100 mL/hr over 30 Minutes Intravenous Every 24 hours 02/23/17 1324 02/23/17 1634      Medications   Scheduled Meds: . aspirin EC  81 mg Oral Daily  . budesonide  0.5 mg Nebulization BID  . carvedilol  12.5 mg Oral BID WC  . Chlorhexidine Gluconate Cloth  6 each Topical Q0600  . citalopram  30 mg Oral Daily  . enoxaparin (LOVENOX) injection  30 mg Subcutaneous Q24H  . fluticasone  2 spray Each Nare Daily  . furosemide  20 mg Oral Daily  . guaiFENesin  600 mg Oral BID  .  Influenza vac split quadrivalent PF  0.5 mL Intramuscular Tomorrow-1000  . ipratropium-albuterol  3 mL Nebulization TID  . loratadine  10 mg Oral Daily  . losartan  12.5 mg Oral Daily  . methylPREDNISolone (SOLU-MEDROL) injection  60 mg Intravenous Q12H  . montelukast  10 mg Oral QHS  . moxifloxacin  1 drop Left Eye TID  . mupirocin ointment  1 application Nasal BID  . pantoprazole  40 mg Oral QAC breakfast  . senna-docusate  1 tablet Oral BID  . simvastatin  20 mg Oral Daily   Continuous Infusions: . cefTRIAXone (ROCEPHIN)  IV     PRN Meds:.acetaminophen **OR** acetaminophen, albuterol, bisacodyl, guaiFENesin, loperamide, magnesium hydroxide, ondansetron **OR** ondansetron (ZOFRAN) IV, senna-docusate   Data Review:   Micro Results Recent Results (from the past 240 hour(s))  MRSA PCR Screening     Status: Abnormal   Collection Time: 02/23/17  4:53 PM  Result Value Ref Range Status   MRSA by PCR POSITIVE (A) NEGATIVE Final    Comment:        The GeneXpert MRSA Assay (FDA approved for NASAL specimens only), is one component of a comprehensive MRSA colonization surveillance program. It is not intended to diagnose MRSA infection nor to guide or monitor treatment for MRSA infections. RESULT CALLED TO, READ BACK BY AND VERIFIED WITH: DAVA ISLEY ON 02/23/17 AT 1833 QSD     Radiology Reports Dg Chest 2 View  Result Date: 02/23/2017 CLINICAL DATA:  Emesis, congestion an UGI symptoms. EXAM: CHEST  2 VIEW COMPARISON:  Chest x-rays dated 01/16/2017 and 09/30/2015. FINDINGS: Cardiomegaly is stable. Atherosclerotic changes again noted at the aortic arch. Prominent pulmonary arteries again noted suggesting chronic pulmonary artery hypertension. Lungs are clear. No pleural effusion or pneumothorax. Probable bibasilar bronchiectasis. No acute or suspicious osseous finding. IMPRESSION:  1. No active cardiopulmonary disease. No evidence of pneumonia or pulmonary edema. 2.  Aortic  atherosclerosis. 3.  Stable cardiomegaly. 4.  Probable bibasilar bronchiectasis. 5.  Probable chronic pulmonary artery hypertension. Electronically Signed   By: Bary Richard M.D.   On: 02/23/2017 11:33     CBC  Recent Labs Lab 02/23/17 1055 02/24/17 0342  WBC 14.2* 8.3  HGB 12.8 11.9*  HCT 38.1 36.3  PLT 225 216  MCV 87.4 86.7  MCH 29.3 28.3  MCHC 33.5 32.6  RDW 15.3* 15.1*  LYMPHSABS 1.2  --   MONOABS 1.3*  --   EOSABS 0.2  --   BASOSABS 0.1  --     Chemistries   Recent Labs Lab 02/23/17 1055 02/23/17 1549 02/24/17 0342  NA 138  --  141  K 4.0  --  4.2  CL 100*  --  104  CO2 27  --  30  GLUCOSE 148*  --  170*  BUN 13  --  17  CREATININE 1.08*  --  1.22*  CALCIUM 8.7*  --  8.6*  MG  --  1.8  --   AST 19  --   --   ALT 8*  --   --   ALKPHOS 59  --   --   BILITOT 0.7  --   --    ------------------------------------------------------------------------------------------------------------------ estimated creatinine clearance is 28.3 mL/min (A) (by C-G formula based on SCr of 1.22 mg/dL (H)). ------------------------------------------------------------------------------------------------------------------ No results for input(s): HGBA1C in the last 72 hours. ------------------------------------------------------------------------------------------------------------------ No results for input(s): CHOL, HDL, LDLCALC, TRIG, CHOLHDL, LDLDIRECT in the last 72 hours. ------------------------------------------------------------------------------------------------------------------ No results for input(s): TSH, T4TOTAL, T3FREE, THYROIDAB in the last 72 hours.  Invalid input(s): FREET3 ------------------------------------------------------------------------------------------------------------------ No results for input(s): VITAMINB12, FOLATE, FERRITIN, TIBC, IRON, RETICCTPCT in the last 72 hours.  Coagulation profile  Recent Labs Lab 02/23/17 1549  INR 1.20    No  results for input(s): DDIMER in the last 72 hours.  Cardiac Enzymes No results for input(s): CKMB, TROPONINI, MYOGLOBIN in the last 168 hours.  Invalid input(s): CK ------------------------------------------------------------------------------------------------------------------ Invalid input(s): POCBNP    Assessment & Plan  Patient's 83 year old admitted with shortness of breath 1. Sepsis due to UTI. Continue Rocephin and follow urine cultures  2.Acute respiratory failure with hypoxia due to acute bronchitis with history of COPD/asthma. Continue oxygen by nasal cannular, DuoNeb every 6 hours, continue home nebulizer. Antibiotics as above. IV Solu-Medrol every 6 hours, add Mucinex to current regimen  3. Hypertension. Continue hypertension medication.  4. Dementia type unknown  5. Depression continue celexa  6. Hyperlipidemia unspecified continue zocor     Code Status Orders        Start     Ordered   02/23/17 1543  Do not attempt resuscitation (DNR)  Continuous    Question Answer Comment  In the event of cardiac or respiratory ARREST Do not call a "code blue"   In the event of cardiac or respiratory ARREST Do not perform Intubation, CPR, defibrillation or ACLS   In the event of cardiac or respiratory ARREST Use medication by any route, position, wound care, and other measures to relive pain and suffering. May use oxygen, suction and manual treatment of airway obstruction as needed for comfort.      02/23/17 1542    Code Status History    Date Active Date Inactive Code Status Order ID Comments User Context   11/11/2015 10:43 PM 11/12/2015  9:00 PM DNR [id number]  Gery Pray,  MD Inpatient   09/30/2015  5:46 PM 10/01/2015  4:34 PM Full Code [id number]  Juanell Fairly, MD ED   06/05/2013  1:20 AM 06/09/2013  9:41 PM Full Code [id number]  Alison Murray, MD Inpatient    Advance Directive Documentation     Most Recent Value  Type of Advance Directive  Out of facility DNR  (pink MOST or yellow form), Healthcare Power of Attorney  Pre-existing out of facility DNR order (yellow form or pink MOST form)  -  "MOST" Form in Place?  -           Consults  none  DVT Prophylaxis  Lovenox   Lab Results  Component Value Date   PLT 216 02/24/2017     Time Spent in minutes Greater than 50% of time spent in care coordination and counseling patient regarding the condition and plan of care.   Auburn Bilberry M.D on 02/24/2017 at 12:07 PM  Between 7am to 6pm - Pager - [phone]  After 6pm go to [email or web address] - password [email or web address] [facility]  [facility] [place] [facility]   Office  [phone]

## 2017-02-24 NOTE — Clinical Social Work Note (Signed)
CSW received consult that patient is from an ALF. The patient was assessed by the ED CSW. CSW is already following.  Argentina Ponder, MSW, Theresia Majors [phone]

## 2017-02-24 NOTE — Progress Notes (Signed)
Physical Therapy Evaluation Patient Details Name: Daisy [name] MRN: [id number] DOB: [birth year] Today's Date: 02/24/2017   History of Present Illness  Patient is a 83 y.o. female admitted from the [place] on 23 Feb 2017 with emesis and cough. Found to have UTI, leukocytosis, and tachypnea. PMH includes COPD, HTN, HLD, CVA, and dementia.  Clinical Impression  Patient admitted from the [facility] for above listed reasons. On evaluation, patient demonstrates need for redirection and frequent orientation reminders with assistance from daughter, Daisy [name]. Daisy [name] reports patient has assistance from her and 2 other sisters at least 4 hours per day, in addition to assistance at the [place]. Patient demonstrates ability to walk with minimal assistance when redirected to task with verbal cues; oxygen saturations remained >90% at all times with no oxygen use. Patient is deemed to be at baseline level of function and has the assistance necessary upon d/c. Patient will benefit from functional mobility training during her hospital stay to improve strength and stamina in preparation for d/c when medically ready.    Follow Up Recommendations No PT follow up    Equipment Recommendations  None recommended by PT    Recommendations for Other Services       Precautions / Restrictions Precautions Precautions: Fall Restrictions Weight Bearing Restrictions: No      Mobility  Bed Mobility Overal bed mobility: Needs Assistance Bed Mobility: Supine to Sit;Sit to Supine     Supine to sit: Mod assist;HOB elevated Sit to supine: Total assist;HOB elevated   General bed mobility comments: Patient requires moderate assistance with verbal cues for supine to sit transfer. Moved from sit to supine with maximal assistance and +2 to scoot to [facility].  Transfers Overall transfer level: Needs assistance Equipment used: Rolling walker (2 wheeled) Transfers: Sit to/from Stand Sit to Stand: Mod assist         General transfer  comment: Patient requires multiple attempts to stand upright, tending to keep knees and hips flexed. With directional verbal cues, patient able to stand and ambulate with greater ease.  Ambulation/Gait Ambulation/Gait assistance: Mod assist Ambulation Distance (Feet): 20 Feet Assistive device: Rolling walker (2 wheeled)       General Gait Details: Patient ambulates with moderate assistance to steer RW and stand upright with seated rest break at 10' in toilet.  Stairs            Wheelchair Mobility    Modified Rankin (Stroke Patients Only)       Balance Overall balance assessment: Needs assistance Sitting-balance support: Feet supported Sitting balance-[name] Scale: Good     Standing balance support: Bilateral upper extremity supported Standing balance-[name] Scale: Fair                               Pertinent Vitals/Pain Pain Assessment: No/denies pain    Home Living Family/patient expects to be discharged to:: Assisted living               Home Equipment: Walker - 2 wheels;Other (comment) (Lift chair)      Prior Function Level of Independence: Needs assistance   Gait / Transfers Assistance Needed: Short household distances with RW and assistance  ADL's / Homemaking Assistance Needed: Performed by AL/family        Hand Dominance        Extremity/Trunk Assessment   Upper Extremity Assessment Upper Extremity Assessment: Generalized weakness    Lower Extremity Assessment Lower Extremity Assessment: Generalized weakness  Communication   Communication: No difficulties  Cognition Arousal/Alertness: Awake/alert Behavior During Therapy: WFL for tasks assessed/performed Overall Cognitive Status: History of cognitive impairments - at baseline                                 General Comments: Patient required frequent orientation reminders and redirection of tasks      General Comments      Exercises      Assessment/Plan    PT Assessment Patient needs continued PT services  PT Problem List Decreased strength;Decreased activity tolerance;Decreased balance;Decreased mobility;Decreased cognition;Decreased knowledge of use of DME;Decreased safety awareness       PT Treatment Interventions DME instruction;Gait training;Functional mobility training;Therapeutic activities;Therapeutic exercise;Balance training;Cognitive remediation;Patient/family education    PT Goals (Current goals can be found in the Care Plan section)  Acute Rehab PT Goals Patient Stated Goal: Unstated PT Goal Formulation: Patient unable to participate in goal setting Time For Goal Achievement: 03/10/17 Potential to Achieve Goals: Good    Frequency Min 2X/week   Barriers to discharge        Co-evaluation               AM-PAC PT "6 Clicks" Daily Activity  Outcome Measure Difficulty turning over in bed (including adjusting bedclothes, sheets and blankets)?: Unable Difficulty moving from lying on back to sitting on the side of the bed? : Unable Difficulty sitting down on and standing up from a chair with arms (e.g., wheelchair, bedside commode, etc,.)?: Unable Help needed moving to and from a bed to chair (including a wheelchair)?: A Lot Help needed walking in hospital room?: A Lot Help needed climbing 3-5 steps with a railing? : Total 6 Click Score: 8    End of Session Equipment Utilized During Treatment: Gait belt Activity Tolerance: Patient tolerated treatment well Patient left: in bed;with call bell/phone within reach;with bed alarm set;with family/visitor present   PT Visit Diagnosis: Unsteadiness on feet (R26.81);Difficulty in walking, not elsewhere classified (R26.2);Muscle weakness (generalized) (M62.81)    Time: 1530-1620 PT Time Calculation (min) (ACUTE ONLY): 50 min   Charges:   PT Evaluation $PT Eval Low Complexity: 1 Low PT Treatments $Therapeutic Activity: 8-22 mins   PT G Codes:   PT  G-Codes **NOT FOR INPATIENT CLASS** Functional Assessment Tool Used: AM-PAC 6 Clicks Basic Mobility;Clinical judgement Functional Limitation: Mobility: Walking and moving around Mobility: Walking and Moving Around Current Status (Z6109): At least 80 percent but less than 100 percent impaired, limited or restricted Mobility: Walking and Moving Around Goal Status [phone]): At least 80 percent but less than 100 percent impaired, limited or restricted      Neita Carp, PT, DPT 02/24/2017, 4:28 PM

## 2017-02-25 LAB — URINE CULTURE: Culture: >=100000 — AB

## 2017-02-25 LAB — EXPECTORATED SPUTUM ASSESSMENT W REFEX TO RESP CULTURE: SPECIAL REQUESTS: NORMAL

## 2017-02-25 LAB — EXPECTORATED SPUTUM ASSESSMENT W GRAM STAIN, RFLX TO RESP C

## 2017-02-25 MED ORDER — PNEUMOCOCCAL VAC POLYVALENT 25 MCG/0.5ML IJ INJ
0.5000 mL | INJECTION | INTRAMUSCULAR | Status: AC
  Administered 2017-02-25: 0.5 mL via INTRAMUSCULAR
  Filled 2017-02-25: qty 0.5

## 2017-02-25 MED ORDER — CEFUROXIME AXETIL 500 MG PO TABS: 500.0000 mg | ORAL_TABLET | Freq: Two times a day (BID) | ORAL | Status: DC

## 2017-02-25 MED ORDER — CEFUROXIME AXETIL 500 MG PO TABS
500.0000 mg | ORAL_TABLET | Freq: Every day | ORAL | Status: DC
  Administered 2017-02-25 – 2017-02-27 (×3): 500 mg via ORAL
  Filled 2017-02-25 (×3): qty 1

## 2017-02-25 MED ORDER — DIPHENHYDRAMINE HCL 25 MG PO CAPS
25.0000 mg | ORAL_CAPSULE | Freq: Every evening | ORAL | Status: DC | PRN
  Administered 2017-02-25: 25 mg via ORAL
  Filled 2017-02-25: qty 1

## 2017-02-25 NOTE — Progress Notes (Signed)
Daughter requesting medication to help pt rest. Dr Anne Hahn notified. Received order for benadryl 25 mg po at HS prn

## 2017-02-25 NOTE — Progress Notes (Signed)
PHARMACY NOTE:  ANTIMICROBIAL RENAL DOSAGE ADJUSTMENT  Current antimicrobial regimen includes a mismatch between antimicrobial dosage and estimated renal function.  As per policy approved by the Pharmacy & Therapeutics and Medical Executive Committees, the antimicrobial dosage will be adjusted accordingly.  Current antimicrobial dosage:  Ceftin  po BID  Indication: UTI  Renal Function:  Estimated Creatinine Clearance: 28.3 mL/min (A) (by C-G formula based on SCr of 1.22 mg/dL (H)).     Antimicrobial dosage has been changed to:  Ceftin  po Q24h for Crcl < 30 ml/min    Thank you for allowing pharmacy to be a part of this patient's care.  Nikol Lemar A, [facility] 02/25/2017 12:13 PM

## 2017-02-25 NOTE — Progress Notes (Signed)
[employer] [facility] [place] [facility]                                                                                                                                                                                  Patient Demographics   Daisy [name], is a 83 y.o. female, DOB - 1922-05-01, ZOX:[id number]  Admit date - 02/23/2017   Admitting Physician Shaune Pollack, MD  Outpatient Primary MD for the patient is Kandyce Rud, MD   LOS - 2  Subjective: Feeling better shortness of breath improved cough improved     Review of Systems:   CONSTITUTIONAL: No documented fever. No fatigue, weakness. No weight gain, no weight loss.  EYES: No blurry or double vision.  ENT: No tinnitus. No postnasal drip. No redness of the oropharynx.  RESPIRATORY: positive cough, no wheeze, no hemoptysis. positive dyspnea.  CARDIOVASCULAR: No chest pain. No orthopnea. No palpitations. No syncope.  GASTROINTESTINAL: No nausea, no vomiting or diarrhea. No abdominal pain. No melena or hematochezia.  GENITOURINARY: No dysuria or hematuria.  ENDOCRINE: No polyuria or nocturia. No heat or cold intolerance.  HEMATOLOGY: No anemia. No bruising. No bleeding.  INTEGUMENTARY: No rashes. No lesions.  MUSCULOSKELETAL: No arthritis. No swelling. No gout.  NEUROLOGIC: No numbness, tingling, or ataxia. No seizure-type activity.  PSYCHIATRIC: No anxiety. No insomnia. No ADD.    Vitals:   Vitals:   02/24/17 2013 02/24/17 2043 02/25/17 0718 02/25/17 0757  BP: (!) 154/67  (!) 156/97   Pulse: 70  66   Resp: 16     Temp: 98.8 F (37.1 C)  97.7 F (36.5 C)   TempSrc: Oral  Oral   SpO2: 94% 94% 92% 92%  Weight:      Height:        Wt Readings from Last 3 Encounters:  02/23/17 177 lb (80.3 kg)  01/16/17 170 lb (77.1 kg)  11/11/15 186 lb 8 oz (84.6 kg)     Intake/Output Summary (Last 24 hours) at 02/25/17 1202 Last data filed at 02/24/17 2013  Gross per 24 hour  Intake              410 ml  Output                 0 ml  Net              410 ml    Physical Exam:   GENERAL: Pleasant-appearing in no apparent distress.  HEAD, EYES, EARS, NOSE AND THROAT: Atraumatic, normocephalic. Extraocular muscles are intact. Pupils equal and reactive to light. Sclerae anicteric. No conjunctival injection. No oro-pharyngeal erythema.  NECK: Supple. There is no jugular venous distention.  No bruits, no lymphadenopathy, no thyromegaly.  HEART: Regular rate and rhythm,. No murmurs, no rubs, no clicks.  LUNGS:bilateral wheezing throughout both lungs no assesory muscle usage ABDOMEN: Soft, flat, nontender, nondistended. Has good bowel sounds. No hepatosplenomegaly appreciated.  EXTREMITIES: No evidence of any cyanosis, clubbing, or peripheral edema.  +2 pedal and radial pulses bilaterally.  NEUROLOGIC: The patient is alert, awake, and oriented x3 with no focal motor or sensory deficits appreciated bilaterally.  SKIN: Moist and warm with no rashes appreciated.  Psych: Not anxious, depressed LN: No inguinal LN enlargement    Antibiotics   Anti-infectives    Start     Dose/Rate Route Frequency Ordered Stop   02/24/17 1330  cefTRIAXone (ROCEPHIN) 1 g in dextrose 5 % 50 mL IVPB     1 g 100 mL/hr over 30 Minutes Intravenous Every 24 hours 02/23/17 1634     02/23/17 1330  cefTRIAXone (ROCEPHIN) 1 g in dextrose 5 % 50 mL IVPB - Premix  Status:  Discontinued     1 g 100 mL/hr over 30 Minutes Intravenous Every 24 hours 02/23/17 1324 02/23/17 1634      Medications   Scheduled Meds: . aspirin EC  81 mg Oral Daily  . budesonide  0.5 mg Nebulization BID  . carvedilol  12.5 mg Oral BID WC  . Chlorhexidine Gluconate Cloth  6 each Topical Q0600  . citalopram  30 mg Oral Daily  . enoxaparin (LOVENOX) injection  30 mg Subcutaneous Q24H  . fluticasone  2 spray Each Nare Daily  . furosemide  20 mg Oral Daily  . guaiFENesin  600 mg Oral BID  . ipratropium-albuterol  3 mL Nebulization TID  . loratadine  10 mg Oral  Daily  . losartan  12.5 mg Oral Daily  . methylPREDNISolone (SOLU-MEDROL) injection  60 mg Intravenous Q12H  . montelukast  10 mg Oral QHS  . moxifloxacin  1 drop Left Eye TID  . mupirocin ointment  1 application Nasal BID  . pantoprazole  40 mg Oral QAC breakfast  . senna-docusate  1 tablet Oral BID  . simvastatin  20 mg Oral Daily   Continuous Infusions: . cefTRIAXone (ROCEPHIN)  IV Stopped (02/24/17 1322)   PRN Meds:.acetaminophen **OR** acetaminophen, albuterol, bisacodyl, guaiFENesin, loperamide, magnesium hydroxide, ondansetron **OR** ondansetron (ZOFRAN) IV, senna-docusate   Data Review:   Micro Results Recent Results (from the past 240 hour(s))  Urine culture     Status: Abnormal   Collection Time: 02/23/17 12:47 PM  Result Value Ref Range Status   Specimen Description URINE, RANDOM  Final   Special Requests NONE  Final   Culture >=100,000 COLONIES/mL ESCHERICHIA COLI (A)  Final   Report Status 02/25/2017 FINAL  Final   Organism ID, Bacteria ESCHERICHIA COLI (A)  Final      Susceptibility   Escherichia coli - MIC*    AMPICILLIN <=2 SENSITIVE Sensitive     CEFAZOLIN <=4 SENSITIVE Sensitive     CEFTRIAXONE <=1 SENSITIVE Sensitive     CIPROFLOXACIN <=0.25 SENSITIVE Sensitive     GENTAMICIN <=1 SENSITIVE Sensitive     IMIPENEM <=0.25 SENSITIVE Sensitive     NITROFURANTOIN <=16 SENSITIVE Sensitive     TRIMETH/SULFA <=20 SENSITIVE Sensitive     AMPICILLIN/SULBACTAM <=2 SENSITIVE Sensitive     PIP/TAZO <=4 SENSITIVE Sensitive     Extended ESBL NEGATIVE Sensitive     * >=100,000 COLONIES/mL ESCHERICHIA COLI  MRSA PCR Screening     Status: Abnormal   Collection Time: 02/23/17  4:53 PM  Result Value Ref Range Status   MRSA by PCR POSITIVE (A) NEGATIVE Final    Comment:        The GeneXpert MRSA Assay (FDA approved for NASAL specimens only), is one component of a comprehensive MRSA colonization surveillance program. It is not intended to diagnose MRSA infection nor  to guide or monitor treatment for MRSA infections. RESULT CALLED TO, READ BACK BY AND VERIFIED WITH: DAVA ISLEY ON 02/23/17 AT 1833 QSD   Culture, expectorated sputum-assessment     Status: None   Collection Time: 02/25/17  9:57 AM  Result Value Ref Range Status   Specimen Description EXPECTORATED SPUTUM  Final   Special Requests Normal  Final   Sputum evaluation THIS SPECIMEN IS ACCEPTABLE FOR SPUTUM CULTURE  Final   Report Status 02/25/2017 FINAL  Final    Radiology Reports Dg Chest 2 View  Result Date: 02/23/2017 CLINICAL DATA:  Emesis, congestion an UGI symptoms. EXAM: CHEST  2 VIEW COMPARISON:  Chest x-rays dated 01/16/2017 and 09/30/2015. FINDINGS: Cardiomegaly is stable. Atherosclerotic changes again noted at the aortic arch. Prominent pulmonary arteries again noted suggesting chronic pulmonary artery hypertension. Lungs are clear. No pleural effusion or pneumothorax. Probable bibasilar bronchiectasis. No acute or suspicious osseous finding. IMPRESSION: 1. No active cardiopulmonary disease. No evidence of pneumonia or pulmonary edema. 2.  Aortic atherosclerosis. 3.  Stable cardiomegaly. 4.  Probable bibasilar bronchiectasis. 5.  Probable chronic pulmonary artery hypertension. Electronically Signed   By: Bary Richard M.D.   On: 02/23/2017 11:33     CBC  Recent Labs Lab 02/23/17 1055 02/24/17 0342  WBC 14.2* 8.3  HGB 12.8 11.9*  HCT 38.1 36.3  PLT 225 216  MCV 87.4 86.7  MCH 29.3 28.3  MCHC 33.5 32.6  RDW 15.3* 15.1*  LYMPHSABS 1.2  --   MONOABS 1.3*  --   EOSABS 0.2  --   BASOSABS 0.1  --     Chemistries   Recent Labs Lab 02/23/17 1055 02/23/17 1549 02/24/17 0342  NA 138  --  141  K 4.0  --  4.2  CL 100*  --  104  CO2 27  --  30  GLUCOSE 148*  --  170*  BUN 13  --  17  CREATININE 1.08*  --  1.22*  CALCIUM 8.7*  --  8.6*  MG  --  1.8  --   AST 19  --   --   ALT 8*  --   --   ALKPHOS 59  --   --   BILITOT 0.7  --   --     ------------------------------------------------------------------------------------------------------------------ estimated creatinine clearance is 28.3 mL/min (A) (by C-G formula based on SCr of 1.22 mg/dL (H)). ------------------------------------------------------------------------------------------------------------------ No results for input(s): HGBA1C in the last 72 hours. ------------------------------------------------------------------------------------------------------------------ No results for input(s): CHOL, HDL, LDLCALC, TRIG, CHOLHDL, LDLDIRECT in the last 72 hours. ------------------------------------------------------------------------------------------------------------------ No results for input(s): TSH, T4TOTAL, T3FREE, THYROIDAB in the last 72 hours.  Invalid input(s): FREET3 ------------------------------------------------------------------------------------------------------------------ No results for input(s): VITAMINB12, FOLATE, FERRITIN, TIBC, IRON, RETICCTPCT in the last 72 hours.  Coagulation profile  Recent Labs Lab 02/23/17 1549  INR 1.20    No results for input(s): DDIMER in the last 72 hours.  Cardiac Enzymes No results for input(s): CKMB, TROPONINI, MYOGLOBIN in the last 168 hours.  Invalid input(s): CK ------------------------------------------------------------------------------------------------------------------ Invalid input(s): POCBNP    Assessment & Plan  Patient's 83 year old admitted with shortness of breath 1. Sepsis due to UTI. Urine culture showing  Escherichia coli Change to oral antibiotics  2.Acute respiratory failure with hypoxia due to acute bronchitis with history of COPD/asthma. Continue oxygen by nasal cannular, DuoNeb every 6 hours, continue home nebulizer. Antibiotics as above. IV Solu-Medrol every 12 hour, continue Mucinex   3. Hypertension. Continue hypertension medication.  4. Dementia type unknown  5.  Depression continue celexa  6. Hyperlipidemia unspecified continue zocor     Code Status Orders        Start     Ordered   02/23/17 1543  Do not attempt resuscitation (DNR)  Continuous    Question Answer Comment  In the event of cardiac or respiratory ARREST Do not call a "code blue"   In the event of cardiac or respiratory ARREST Do not perform Intubation, CPR, defibrillation or ACLS   In the event of cardiac or respiratory ARREST Use medication by any route, position, wound care, and other measures to relive pain and suffering. May use oxygen, suction and manual treatment of airway obstruction as needed for comfort.      02/23/17 1542    Code Status History    Date Active Date Inactive Code Status Order ID Comments User Context   11/11/2015 10:43 PM 11/12/2015  9:00 PM DNR [id number]  Gery Pray, MD Inpatient   09/30/2015  5:46 PM 10/01/2015  4:34 PM Full Code [id number]  Juanell Fairly, MD ED   06/05/2013  1:20 AM 06/09/2013  9:41 PM Full Code [id number]  Alison Murray, MD Inpatient    Advance Directive Documentation     Most Recent Value  Type of Advance Directive  Out of facility DNR (pink MOST or yellow form), Healthcare Power of Attorney  Pre-existing out of facility DNR order (yellow form or pink MOST form)  -  "MOST" Form in Place?  -           Consults  none  DVT Prophylaxis  Lovenox   Lab Results  Component Value Date   PLT 216 02/24/2017     Time Spent in minutes Greater than 50% of time spent in care coordination and counseling patient regarding the condition and plan of care.   Auburn Bilberry M.D on 02/25/2017 at 12:02 PM  Between 7am to 6pm - Pager - [phone]  After 6pm go to [email or web address] - password [email or web address] [facility]  [facility]   Office  [phone]

## 2017-02-25 NOTE — Progress Notes (Signed)
Nutrition Brief Note  Patient identified on the Malnutrition Screening Tool (MST) Report  Wt Readings from Last 15 Encounters:  02/23/17 177 lb (80.3 kg)  01/16/17 170 lb (77.1 kg)  11/11/15 186 lb 8 oz (84.6 kg)  10/01/15 189 lb 8 oz (86 kg)  02/22/15 195 lb 9.6 oz (88.7 kg)  11/26/14 200 lb (90.7 kg)  11/21/14 200 lb (90.7 kg)  11/05/14 196 lb 12.8 oz (89.3 kg)  09/20/14 186 lb (84.4 kg)  06/05/13 189 lb 9.5 oz (86 kg)  06/27/10 170 lb (77.1 kg)   83 yo female with PMH of COPD, HTN, HLD, CVA, Dementia, SOB, Cough, presents with UTI & Sepsis She ate yogurt, a fruit cup, eggs, and a blueberry muffin this morning. Daughter denies any recent weight loss. Reports good PO intake PTA. Patient did not eat for 2-3 days when she got sick at the [facility] but eating well today. No current needs.  Body mass index is 32.37 kg/m. Patient meets criteria for obese based on current BMI.   Current diet order is heart healthy, patient is consuming approximately 50-100% of meals at this time. Labs and medications reviewed.   No nutrition interventions warranted at this time. If nutrition issues arise, please consult RD.   Dionne Ano. Ireanna Finlayson, MS, RD LDN Inpatient Clinical Dietitian Pager [phone]

## 2017-02-26 ENCOUNTER — Inpatient Hospital Stay: Payer: PPO

## 2017-02-26 MED ORDER — PREDNISONE 50 MG PO TABS
50.0000 mg | ORAL_TABLET | Freq: Every day | ORAL | Status: DC
  Administered 2017-02-27: 50 mg via ORAL
  Filled 2017-02-26: qty 1

## 2017-02-26 MED ORDER — POLYETHYLENE GLYCOL 3350 17 G PO PACK
17.0000 g | PACK | Freq: Every day | ORAL | Status: DC
  Administered 2017-02-26 – 2017-02-27 (×2): 17 g via ORAL
  Filled 2017-02-26 (×2): qty 1

## 2017-02-26 MED ORDER — HYDRALAZINE HCL 20 MG/ML IJ SOLN: 5.0000 mg | Freq: Four times a day (QID) | INTRAMUSCULAR | Status: DC | PRN

## 2017-02-26 MED ORDER — LOSARTAN POTASSIUM 50 MG PO TABS
50.0000 mg | ORAL_TABLET | Freq: Every day | ORAL | Status: DC
  Administered 2017-02-27: 50 mg via ORAL
  Filled 2017-02-26: qty 1

## 2017-02-26 MED ORDER — LACTULOSE 10 GM/15ML PO SOLN
30.0000 g | Freq: Two times a day (BID) | ORAL | Status: DC
  Administered 2017-02-26 – 2017-02-27 (×3): 30 g via ORAL
  Filled 2017-02-26 (×3): qty 60

## 2017-02-26 NOTE — Progress Notes (Signed)
PT is recommending no follow up. Per MD note today patient will likely D/C tomorrow. Plan is for patient to D/C back to [facility]. Conservator, museum/gallery at [facility] is aware of above.   [employer], LCSW [phone]

## 2017-02-26 NOTE — Progress Notes (Signed)
Physical Therapy Treatment Patient Details Name: Daisy [name] MRN: [id number] DOB: 07/06/1921 Today's Date: 02/26/2017    History of Present Illness Patient is a 83 y.o. female admitted from the [place] on 23 Feb 2017 with emesis and cough. Found to have UTI, leukocytosis, and tachypnea. PMH includes COPD, HTN, HLD, CVA, and dementia.    PT Comments    Pt is able to ambulate in the room relatively well once she got started, but struggled with initial steps and did fatigue significantly by the end of the effort.  Pt needed considerable cuing and repeated instruction to perform exercises properly, instructed pt's daughter on execution of HEP.  Pt was pleasant and eager to work during session but needed consistent reorientation and redirection.     Follow Up Recommendations  No PT follow up (instructed family on HEP, changes from baseline unlikely)     Equipment Recommendations       Recommendations for Other Services       Precautions / Restrictions Precautions Precautions: Fall Restrictions Weight Bearing Restrictions: No    Mobility  Bed Mobility Overal bed mobility: Needs Assistance Bed Mobility: Supine to Sit     Supine to sit: Mod assist;HOB elevated     General bed mobility comments: Pt showed good effort getting to EOB, but needed assist with LEs and considerable assist to actually lift torso to upright  Transfers Overall transfer level: Needs assistance Equipment used: Rolling walker (2 wheeled) Transfers: Sit to/from Stand Sit to Stand: Min assist;Mod assist         General transfer comment: Extensive cuing to get to EOB and position properly, pt also required physical assist getting to standing and initially was leaning back heavily needing a lot of assist to shift weight forward to the walker.   Ambulation/Gait Ambulation/Gait assistance: Min assist Ambulation Distance (Feet): 65 Feet Assistive device: Rolling walker (2 wheeled)       General Gait  Details: Pt needing constant cuing and encouragement but actually was able to ambulate better than expected.  Daughter reports that she is near her baseline, and despite her fatigue ultimately she did well though she at times was impulsive and also needed increased cuing.    Stairs            Wheelchair Mobility    Modified Rankin (Stroke Patients Only)       Balance Overall balance assessment: Needs assistance Sitting-balance support: Feet supported Sitting balance-[name] Scale: Good Sitting balance - Comments: Once in sitting pt was able to maintain balance   Standing balance support: Bilateral upper extremity supported Standing balance-[name] Scale: Poor Standing balance comment: Pt initially leaning heavily back, using LE leverage on the bed.  After PT was able to get her to shift weight forward to the walker she did have improved stability/balance                            Cognition Arousal/Alertness: Awake/alert Behavior During Therapy: WFL for tasks assessed/performed Overall Cognitive Status: History of cognitive impairments - at baseline                                        Exercises General Exercises - Lower Extremity Ankle Circles/Pumps: AROM;10 reps Long Arc Quad: AROM;10 reps Heel Slides: AROM;10 reps Hip ABduction/ADduction: AAROM;10 reps Hip Flexion/Marching: AROM;10 reps    General Comments  Pertinent Vitals/Pain Pain Assessment: No/denies pain    Home Living                      Prior Function            PT Goals (current goals can now be found in the care plan section) Progress towards PT goals: Progressing toward goals    Frequency    Min 2X/week      PT Plan Current plan remains appropriate    Co-evaluation              AM-PAC PT "6 Clicks" Daily Activity  Outcome Measure  Difficulty turning over in bed (including adjusting bedclothes, sheets and blankets)?:  Unable Difficulty moving from lying on back to sitting on the side of the bed? : Unable Difficulty sitting down on and standing up from a chair with arms (e.g., wheelchair, bedside commode, etc,.)?: Unable Help needed moving to and from a bed to chair (including a wheelchair)?: A Lot Help needed walking in hospital room?: A Lot Help needed climbing 3-5 steps with a railing? : Total 6 Click Score: 8    End of Session Equipment Utilized During Treatment: Gait belt Activity Tolerance: Patient tolerated treatment well Patient left: in bed;with call bell/phone within reach;with bed alarm set;with family/visitor present Nurse Communication: Mobility status PT Visit Diagnosis: Unsteadiness on feet (R26.81);Difficulty in walking, not elsewhere classified (R26.2);Muscle weakness (generalized) (M62.81)     Time: 1610-9604 PT Time Calculation (min) (ACUTE ONLY): 45 min  Charges:  $Gait Training: 8-22 mins $Therapeutic Exercise: 8-22 mins $Therapeutic Activity: 8-22 mins                    G Codes:       Malachi Pro, DPT 02/26/2017, 1:34 PM

## 2017-02-26 NOTE — Progress Notes (Addendum)
[employer] [facility] - [place] [facility]                                                                                                                                                                                  Patient Demographics   Daisy [name], is a 83 y.o. female, DOB - [birth year], VHQ:[id number]  Admit date - 02/23/2017   Admitting Physician Shaune Pollack, MD  Outpatient Primary MD for the patient is Kandyce Rud, MD   LOS - 3  Subjective: Patient having little more short of breath complains of abdominal discomfort No bowel movement   Review of Systems:   CONSTITUTIONAL: No documented fever. No fatigue, weakness. No weight gain, no weight loss.  EYES: No blurry or double vision.  ENT: No tinnitus. No postnasal drip. No redness of the oropharynx.  RESPIRATORY: positive cough, no wheeze, no hemoptysis. positive dyspnea.  CARDIOVASCULAR: No chest pain. No orthopnea. No palpitations. No syncope.  GASTROINTESTINAL: No nausea, no vomiting or diarrhea. Positive abdominal pain. No melena or hematochezia.  GENITOURINARY: No dysuria or hematuria.  ENDOCRINE: No polyuria or nocturia. No heat or cold intolerance.  HEMATOLOGY: No anemia. No bruising. No bleeding.  INTEGUMENTARY: No rashes. No lesions.  MUSCULOSKELETAL: No arthritis. No swelling. No gout.  NEUROLOGIC: No numbness, tingling, or ataxia. No seizure-type activity.  PSYCHIATRIC: No anxiety. No insomnia. No ADD.    Vitals:   Vitals:   02/25/17 2031 02/26/17 0712 02/26/17 0713 02/26/17 0730  BP:  (!) 205/86 (!) 204/82   Pulse:  68 66   Resp:      Temp:  97.7 F (36.5 C)    TempSrc:  Oral    SpO2: 92% 93%  93%  Weight:      Height:        Wt Readings from Last 3 Encounters:  02/23/17 177 lb (80.3 kg)  01/16/17 170 lb (77.1 kg)  11/11/15 186 lb 8 oz (84.6 kg)     Intake/Output Summary (Last 24 hours) at 02/26/17 1412 Last data filed at 02/26/17 4132  Gross per 24 hour  Intake                0  ml  Output              400 ml  Net             -400 ml    Physical Exam:   GENERAL: Pleasant-appearing in no apparent distress.  HEAD, EYES, EARS, NOSE AND THROAT: Atraumatic, normocephalic. Extraocular muscles are intact. Pupils equal and reactive to light. Sclerae anicteric. No conjunctival injection. No oro-pharyngeal erythema.  NECK: Supple. There is no jugular venous distention.  No bruits, no lymphadenopathy, no thyromegaly.  HEART: Regular rate and rhythm,. No murmurs, no rubs, no clicks.  LUNGS: Occasional bilateral wheezing throughout both lungs no assesory muscle usage ABDOMEN: Soft, flat, nontender, nondistended. Has good bowel sounds. No hepatosplenomegaly appreciated.  EXTREMITIES: No evidence of any cyanosis, clubbing, or peripheral edema.  +2 pedal and radial pulses bilaterally.  NEUROLOGIC: The patient is alert, awake, and oriented x3 with no focal motor or sensory deficits appreciated bilaterally.  SKIN: Moist and warm with no rashes appreciated.  Psych: Not anxious, depressed LN: No inguinal LN enlargement    Antibiotics   Anti-infectives    Start     Dose/Rate Route Frequency Ordered Stop   02/25/17 1700  cefUROXime (CEFTIN) tablet 500 mg  Status:  Discontinued     500 mg Oral 2 times daily with meals 02/25/17 1204 02/25/17 1213   02/25/17 1230  cefUROXime (CEFTIN) tablet 500 mg     500 mg Oral Daily with lunch 02/25/17 1213     02/24/17 1330  cefTRIAXone (ROCEPHIN) 1 g in dextrose 5 % 50 mL IVPB  Status:  Discontinued     1 g 100 mL/hr over 30 Minutes Intravenous Every 24 hours 02/23/17 1634 02/25/17 1204   02/23/17 1330  cefTRIAXone (ROCEPHIN) 1 g in dextrose 5 % 50 mL IVPB - Premix  Status:  Discontinued     1 g 100 mL/hr over 30 Minutes Intravenous Every 24 hours 02/23/17 1324 02/23/17 1634      Medications   Scheduled Meds: . aspirin EC  81 mg Oral Daily  . budesonide  0.5 mg Nebulization BID  . carvedilol  12.5 mg Oral BID WC  . cefUROXime  500 mg  Oral Q lunch  . Chlorhexidine Gluconate Cloth  6 each Topical Q0600  . citalopram  30 mg Oral Daily  . enoxaparin (LOVENOX) injection  30 mg Subcutaneous Q24H  . fluticasone  2 spray Each Nare Daily  . furosemide  20 mg Oral Daily  . guaiFENesin  600 mg Oral BID  . ipratropium-albuterol  3 mL Nebulization TID  . lactulose  30 g Oral BID  . loratadine  10 mg Oral Daily  . losartan  12.5 mg Oral Daily  . methylPREDNISolone (SOLU-MEDROL) injection  60 mg Intravenous Q12H  . montelukast  10 mg Oral QHS  . moxifloxacin  1 drop Left Eye TID  . mupirocin ointment  1 application Nasal BID  . pantoprazole  40 mg Oral QAC breakfast  . polyethylene glycol  17 g Oral Daily  . senna-docusate  1 tablet Oral BID  . simvastatin  20 mg Oral Daily   Continuous Infusions:  PRN Meds:.acetaminophen **OR** acetaminophen, albuterol, bisacodyl, diphenhydrAMINE, guaiFENesin, hydrALAZINE, loperamide, magnesium hydroxide, ondansetron **OR** ondansetron (ZOFRAN) IV, senna-docusate   Data Review:   Micro Results Recent Results (from the past 240 hour(s))  Urine culture     Status: Abnormal   Collection Time: 02/23/17 12:47 PM  Result Value Ref Range Status   Specimen Description URINE, RANDOM  Final   Special Requests NONE  Final   Culture >=100,000 COLONIES/mL ESCHERICHIA COLI (A)  Final   Report Status 02/25/2017 FINAL  Final   Organism ID, Bacteria ESCHERICHIA COLI (A)  Final      Susceptibility   Escherichia coli - MIC*    AMPICILLIN <=2 SENSITIVE Sensitive     CEFAZOLIN <=4 SENSITIVE Sensitive     CEFTRIAXONE <=1 SENSITIVE Sensitive     CIPROFLOXACIN <=0.25 SENSITIVE Sensitive  GENTAMICIN <=1 SENSITIVE Sensitive     IMIPENEM <=0.25 SENSITIVE Sensitive     NITROFURANTOIN <=16 SENSITIVE Sensitive     TRIMETH/SULFA <=20 SENSITIVE Sensitive     AMPICILLIN/SULBACTAM <=2 SENSITIVE Sensitive     PIP/TAZO <=4 SENSITIVE Sensitive     Extended ESBL NEGATIVE Sensitive     * >=100,000 COLONIES/mL  ESCHERICHIA COLI  MRSA PCR Screening     Status: Abnormal   Collection Time: 02/23/17  4:53 PM  Result Value Ref Range Status   MRSA by PCR POSITIVE (A) NEGATIVE Final    Comment:        The GeneXpert MRSA Assay (FDA approved for NASAL specimens only), is one component of a comprehensive MRSA colonization surveillance program. It is not intended to diagnose MRSA infection nor to guide or monitor treatment for MRSA infections. RESULT CALLED TO, READ BACK BY AND VERIFIED WITH: DAVA ISLEY ON 02/23/17 AT 1833 QSD   Culture, expectorated sputum-assessment     Status: None   Collection Time: 02/25/17  9:57 AM  Result Value Ref Range Status   Specimen Description EXPECTORATED SPUTUM  Final   Special Requests Normal  Final   Sputum evaluation THIS SPECIMEN IS ACCEPTABLE FOR SPUTUM CULTURE  Final   Report Status 02/25/2017 FINAL  Final  Culture, respiratory (NON-Expectorated)     Status: None (Preliminary result)   Collection Time: 02/25/17  9:57 AM  Result Value Ref Range Status   Specimen Description EXPECTORATED SPUTUM  Final   Special Requests Normal Reflexed from [id number]  Final   Gram Stain   Final    ABUNDANT WBC PRESENT,BOTH PMN AND MONONUCLEAR FEW GRAM NEGATIVE RODS RARE GRAM POSITIVE COCCI IN PAIRS    Culture   Final    CULTURE REINCUBATED FOR BETTER GROWTH Performed at [facility], [address], [place], [place] [address]    Report Status PENDING  Incomplete    Radiology Reports Dg Chest 2 View  Result Date: 02/23/2017 CLINICAL DATA:  Emesis, congestion an UGI symptoms. EXAM: CHEST  2 VIEW COMPARISON:  Chest x-rays dated 01/16/2017 and 09/30/2015. FINDINGS: Cardiomegaly is stable. Atherosclerotic changes again noted at the aortic arch. Prominent pulmonary arteries again noted suggesting chronic pulmonary artery hypertension. Lungs are clear. No pleural effusion or pneumothorax. Probable bibasilar bronchiectasis. No acute or suspicious osseous finding. IMPRESSION:  1. No active cardiopulmonary disease. No evidence of pneumonia or pulmonary edema. 2.  Aortic atherosclerosis. 3.  Stable cardiomegaly. 4.  Probable bibasilar bronchiectasis. 5.  Probable chronic pulmonary artery hypertension. Electronically Signed   By: Bary Richard M.D.   On: 02/23/2017 11:33   Dg Chest Port 1 View  Result Date: 02/26/2017 CLINICAL DATA:  Shortness of breath.  Cough. EXAM: PORTABLE CHEST 1 VIEW COMPARISON:  02/23/2017 FINDINGS: Chronic cardiomegaly. Tortuosity and calcification of the thoracic aorta. Pulmonary vascularity now appears normal. Lungs are clear. No acute bone abnormality. IMPRESSION: No acute abnormalities. Aortic atherosclerosis. Chronic cardiomegaly. Lungs are clear. Electronically Signed   By: Francene Boyers M.D.   On: 02/26/2017 11:53     CBC  Recent Labs Lab 02/23/17 1055 02/24/17 0342  WBC 14.2* 8.3  HGB 12.8 11.9*  HCT 38.1 36.3  PLT 225 216  MCV 87.4 86.7  MCH 29.3 28.3  MCHC 33.5 32.6  RDW 15.3* 15.1*  LYMPHSABS 1.2  --   MONOABS 1.3*  --   EOSABS 0.2  --   BASOSABS 0.1  --     Chemistries   Recent Labs Lab 02/23/17  1055 02/23/17 1549 02/24/17 0342  NA 138  --  141  K 4.0  --  4.2  CL 100*  --  104  CO2 27  --  30  GLUCOSE 148*  --  170*  BUN 13  --  17  CREATININE 1.08*  --  1.22*  CALCIUM 8.7*  --  8.6*  MG  --  1.8  --   AST 19  --   --   ALT 8*  --   --   ALKPHOS 59  --   --   BILITOT 0.7  --   --    ------------------------------------------------------------------------------------------------------------------ estimated creatinine clearance is 28.3 mL/min (A) (by C-G formula based on SCr of 1.22 mg/dL (H)). ------------------------------------------------------------------------------------------------------------------ No results for input(s): HGBA1C in the last 72 hours. ------------------------------------------------------------------------------------------------------------------ No results for input(s): CHOL,  HDL, LDLCALC, TRIG, CHOLHDL, LDLDIRECT in the last 72 hours. ------------------------------------------------------------------------------------------------------------------ No results for input(s): TSH, T4TOTAL, T3FREE, THYROIDAB in the last 72 hours.  Invalid input(s): FREET3 ------------------------------------------------------------------------------------------------------------------ No results for input(s): VITAMINB12, FOLATE, FERRITIN, TIBC, IRON, RETICCTPCT in the last 72 hours.  Coagulation profile  Recent Labs Lab 02/23/17 1549  INR 1.20    No results for input(s): DDIMER in the last 72 hours.  Cardiac Enzymes No results for input(s): CKMB, TROPONINI, MYOGLOBIN in the last 168 hours.  Invalid input(s): CK ------------------------------------------------------------------------------------------------------------------ Invalid input(s): POCBNP    Assessment & Plan  Patient's 83 year old admitted with shortness of breath 1. Sepsis due to UTI. Urine culture showing Escherichia coli Continue oral antibiotics  2.Acute respiratory failure with hypoxia due to acute bronchitis with history of COPD/asthma. Repeat chest x-ray shows no pneumonia  3. Hypertension. Continue hypertension medication.Blood pressure elevated I will increase her Cozaar use when necessary IV hydralazine  4. Dementia type unknown  5. Depression continue celexa  6. Hyperlipidemia unspecified continue zocor  7 constipation I have started patient on MiraLAX and lactulose artery on Dulcolax and senna.   Discharge tomorrow     Code Status Orders        Start     Ordered   02/23/17 1543  Do not attempt resuscitation (DNR)  Continuous    Question Answer Comment  In the event of cardiac or respiratory ARREST Do not call a "code blue"   In the event of cardiac or respiratory ARREST Do not perform Intubation, CPR, defibrillation or ACLS   In the event of cardiac or respiratory ARREST Use  medication by any route, position, wound care, and other measures to relive pain and suffering. May use oxygen, suction and manual treatment of airway obstruction as needed for comfort.      02/23/17 1542    Code Status History    Date Active Date Inactive Code Status Order ID Comments User Context   11/11/2015 10:43 PM 11/12/2015  9:00 PM DNR [id number]  Gery Pray, MD Inpatient   09/30/2015  5:46 PM 10/01/2015  4:34 PM Full Code [id number]  Juanell Fairly, MD ED   06/05/2013  1:20 AM 06/09/2013  9:41 PM Full Code [id number]  Alison Murray, MD Inpatient    Advance Directive Documentation     Most Recent Value  Type of Advance Directive  Out of facility DNR (pink MOST or yellow form), Healthcare Power of Attorney  Pre-existing out of facility DNR order (yellow form or pink MOST form)  -  "MOST" Form in Place?  -           Consults  none  DVT Prophylaxis  Lovenox   Lab Results  Component Value Date   PLT 216 02/24/2017     Time Spent in minutes Greater than 50% of time spent in care coordination and counseling patient regarding the condition and plan of care.   Auburn Bilberry M.D on 02/26/2017 at 2:12 PM  Between 7am to 6pm - Pager - [phone]  After 6pm go to [email or web address] - password [email or web address] [facility]  [facility]   Office  [phone]

## 2017-02-27 LAB — CULTURE, RESPIRATORY W GRAM STAIN: Culture: NORMAL

## 2017-02-27 LAB — CULTURE, RESPIRATORY: SPECIAL REQUESTS: NORMAL

## 2017-02-27 MED ORDER — CEFUROXIME AXETIL 500 MG PO TABS: 500.0000 mg | ORAL_TABLET | Freq: Every day | ORAL | 0 refills | Status: AC

## 2017-02-27 MED ORDER — HYDRALAZINE HCL 20 MG/ML IJ SOLN
10.0000 mg | Freq: Four times a day (QID) | INTRAMUSCULAR | Status: DC | PRN
  Administered 2017-02-27: 10 mg via INTRAVENOUS
  Filled 2017-02-27: qty 1

## 2017-02-27 MED ORDER — GUAIFENESIN 100 MG/5ML PO SOLN
5.0000 mL | ORAL | 0 refills | Status: DC | PRN
Start: 2017-02-27 — End: 2019-07-27

## 2017-02-27 MED ORDER — IPRATROPIUM-ALBUTEROL 0.5-2.5 (3) MG/3ML IN SOLN: 3.0000 mL | Freq: Three times a day (TID) | RESPIRATORY_TRACT | 1 refills | Status: AC

## 2017-02-27 MED ORDER — PREDNISONE 10 MG (21) PO TBPK: ORAL_TABLET | ORAL | 0 refills | Status: DC

## 2017-02-27 MED ORDER — LOSARTAN POTASSIUM 100 MG PO TABS: 100.0000 mg | ORAL_TABLET | Freq: Every day | ORAL | 11 refills | Status: DC

## 2017-02-27 NOTE — Progress Notes (Signed)
Patient is being picked up by the [facility] transportation. I explained this to her daughter and she verbalized understanding. Patient has no complaints or SS of distress.

## 2017-02-27 NOTE — NC FL2 (Signed)
[place] MEDICAID FL2 LEVEL OF CARE SCREENING TOOL     IDENTIFICATION  Patient Name: Daisy [name] Birthdate: Feb 27, 1922 Sex: female Admission Date (Current Location): 02/23/2017  [facility] and [place] Number:  Chiropodist and Address:  [facility], [address], [place], [place] [address]      Provider Number: [id number]  Attending Physician Name and Address:  Auburn Bilberry, MD  Relative Name and Phone Number:       Current Level of Care: Hospital Recommended Level of Care: Assisted Living Facility Prior Approval Number:    Date Approved/Denied:   PASRR Number:   Discharge Plan: Domiciliary (Rest home)    Current Diagnoses: Patient Active Problem List   Diagnosis Date Noted  . Sepsis (HCC) 02/23/2017  . Rectal bleed 11/11/2015  . Dementia 11/11/2015  . COPD (chronic obstructive pulmonary disease) (HCC) 11/11/2015  . Dyslipidemia 11/11/2015  . Infection of urinary tract 10/04/2015  . Escherichia coli (E. coli) infection 10/04/2015  . CKD (chronic kidney disease), stage III (HCC) 10/04/2015  . Hyperglycemia 10/04/2015  . MRSA carrier 10/04/2015  . Leukocytosis 10/04/2015  . Thrombocytopenia (HCC) 10/04/2015  . Closed right hip fracture (HCC) 09/30/2015  . Recurrent UTI 11/12/2014  . Microscopic hematuria 11/12/2014  . Atrophic vaginitis 11/12/2014  . CVA (cerebral infarction) 06/05/2013  . Hypotension, unspecified 06/05/2013  . Other and unspecified hyperlipidemia 06/05/2013  . Acute renal failure (HCC) 06/05/2013  . Leukocytosis, unspecified 06/05/2013  . Essential hypertension, benign 06/05/2013  . Depression 06/05/2013  . GERD (gastroesophageal reflux disease) 06/05/2013    Orientation RESPIRATION BLADDER Height & Weight     Self  Normal Continent Weight: 177 lb (80.3 kg) Height:   (157.5 cm)  BEHAVIORAL SYMPTOMS/MOOD NEUROLOGICAL BOWEL NUTRITION STATUS      Continent Diet (Regular Diet )  AMBULATORY  STATUS COMMUNICATION OF NEEDS Skin   Limited Assistance  Verbally Normal                       Personal Care Assistance Level of Assistance  Bathing, Feeding, Dressing Bathing Assistance: Limited assistance Feeding assistance: Independent Dressing Assistance: Limited assistance     Functional Limitations Info  Sight, Hearing, Speech Sight Info: Adequate Hearing Info: Adequate Speech Info: Adequate    SPECIAL CARE FACTORS FREQUENCY                     Contractures      Additional Factors Info  Code Status, Allergies, Isolation Precautions Code Status: DNR  Allergies Info:  (Aricept Donepezil Hcl, Codeine, Ketek Telithromycin)     Isolation Precautions Info:  (MRSA Nasal Swab. )    Discharge Medications: Please see discharge summary for a list of discharge medications. Medication List             TAKE these medications            acetaminophen 325 MG tablet Commonly known as:  TYLENOL Take 650 mg by mouth every 4 (four) hours as needed for fever.    aspirin EC 81 MG tablet Take 81 mg by mouth daily.    beclomethasone 80 MCG/ACT inhaler Commonly known as:  QVAR Inhale 2 puffs into the lungs 2 (two) times daily.    carvedilol 12.5 MG tablet Commonly known as:  COREG Take 1 tablet (12.5 mg total) by mouth 2 (two) times daily with a meal.    cefUROXime 500 MG tablet Commonly known as:  CEFTIN Take 1  tablet (500 mg total) by mouth daily with lunch.    cetirizine 10 MG tablet Commonly known as:  ZYRTEC Take 10 mg by mouth daily.    citalopram 20 MG tablet Commonly known as:  CELEXA Take 30 mg by mouth daily.    fluticasone 50 MCG/ACT nasal spray Commonly known as:  FLONASE Place 2 sprays into both nostrils daily.    furosemide 20 MG tablet Commonly known as:  LASIX Take 20 mg by mouth daily.    guaiFENesin 100 MG/5ML Soln Commonly known as:  ROBITUSSIN Take 5 mLs (100 mg total) by mouth every 4 (four) hours as needed for cough  or to loosen phlegm.    ipratropium-albuterol 0.5-2.5 (3) MG/3ML Soln Commonly known as:  DUONEB Take 3 mLs by nebulization 3 (three) times daily.    loperamide 2 MG tablet Commonly known as:  IMODIUM A-D Take 4 mg by mouth as needed for diarrhea or loose stools. Do not exceed 8 doses in 24 hours.    losartan 100 MG tablet Commonly known as:  COZAAR Take 1 tablet (100 mg total) by mouth daily. What changed:  medication strength  how much to take    magnesium hydroxide 400 MG/5ML suspension Commonly known as:  MILK OF MAGNESIA Take 30 mLs by mouth daily as needed for mild constipation.    mometasone 220 MCG/INH inhaler Commonly known as:  ASMANEX Inhale 1 puff into the lungs 2 (two) times daily.    montelukast 10 MG tablet Commonly known as:  SINGULAIR Take 10 mg by mouth at bedtime.    moxifloxacin 0.5 % ophthalmic solution Commonly known as:  VIGAMOX Place 1 drop into the left eye 3 (three) times daily.    nystatin cream Commonly known as:  MYCOSTATIN Apply 1 application topically 2 (two) times daily.    omeprazole 20 MG capsule Commonly known as:  PRILOSEC Take 20 mg by mouth daily before breakfast.    predniSONE 10 MG (21) Tbpk tablet Commonly known as:  STERAPRED UNI-PAK 21 TAB Start at  taper by  until complete    senna-docusate 8.6-50 MG tablet Commonly known as:  Senokot-S Take 1 tablet by mouth 2 (two) times daily.    simvastatin 20 MG tablet Commonly known as:  ZOCOR Take 20 mg by mouth daily.    vitamin B-12 1000 MCG tablet Commonly known as:  CYANOCOBALAMIN Take 1,000 mcg by mouth daily.    Relevant Imaging Results: Relevant Lab Results: Additional Information  (SSN: [id number])  Calinda Stockinger, Darleen Crocker, LCSW

## 2017-02-27 NOTE — Progress Notes (Signed)
Report given to [facility] at the [facility].

## 2017-02-27 NOTE — Care Management Important Message (Signed)
Important Message  Patient Details  Name: Daisy [name] MRN: [id number] Date of Birth: Apr 20, 1922   Medicare Important Message Given:  Yes    Marily Memos, RN 02/27/2017, 10:45 AM

## 2017-02-27 NOTE — Discharge Instructions (Signed)
[facility] [place] at [facility] ° °DIET:  °Cardiac diet ° °DISCHARGE CONDITION:  °Stable ° °ACTIVITY:  °Activity as tolerated ° °OXYGEN:  °Home Oxygen: No. °  °Oxygen Delivery: room air ° °DISCHARGE LOCATION:  °home  ° ° °ADDITIONAL DISCHARGE INSTRUCTION: ° ° °If you experience worsening of your admission symptoms, develop shortness of breath, life threatening emergency, suicidal or homicidal thoughts you must seek medical attention immediately by calling 911 or calling your MD immediately  if symptoms less severe. ° °You Must read complete instructions/literature along with all the possible adverse reactions/side effects for all the Medicines you take and that have been prescribed to you. Take any new Medicines after you have completely understood and accpet all the possible adverse reactions/side effects.  ° °Please note ° °You were cared for by a hospitalist during your hospital stay. If you have any questions about your discharge medications or the care you received while you were in the hospital after you are discharged, you can call the unit and asked to speak with the hospitalist on call if the hospitalist that took care of you is not available. Once you are discharged, your primary care physician will handle any further medical issues. Please note that NO REFILLS for any discharge medications will be authorized once you are discharged, as it is imperative that you return to your primary care physician (or establish a relationship with a primary care physician if you do not have one) for your aftercare needs so that they can reassess your need for medications and monitor your lab values. ° ° °

## 2017-02-27 NOTE — Progress Notes (Signed)
Patient is medically stable for D/C back to [facility] today. Per [facility] resident care Nurse, adult at [facility] patient can return. Penny from [facility] will transport patient at 2:30 pm today. Clinical Child psychotherapist (CSW) sent D/C orders to [facility]. Patient's daughter [name] is at bedside and aware of D/C today. CSW contacted patient's son Gala Romney and daughter [name] and made then aware of above. Please reconsult if future social work needs arise. CSW signing off.   [employer], LCSW [phone]

## 2017-02-27 NOTE — Discharge Summary (Signed)
[facility] [place] at [facility], 83 y.o., DOB January 01, 1922, MRN [id number]. Admission date: 02/23/2017 Discharge Date 02/27/2017 Primary MD Kandyce Rud, MD Admitting Physician Shaune Pollack, MD  Admission Diagnosis  Acute respiratory failure with hypoxia (HCC) [J96.01] Urinary tract infection without hematuria, site unspecified [N39.0]  Discharge Diagnosis   Active Problems:    Sepsis due to UTI    Acute respiratory failure with hypoxia due to acute bronchitis as well as acute COPD/asthma exasperation Accelerated hypertension Previous history of CVA GERD Depression Dementia         Hospital Course Daisy [name]  is a 83 y.o. female with a known history of COPD, hypertension, hyperlipidemia, CVA and dementia. The patient was sent from assisted living to the ED due emesis and cough. In the emergency room she was noted to urinary tract infection as well as noted to be hypoxic. Patient was admitted for sepsis due to UTI as well as acute asthma exasperation. Patient was treated with IV antibiotics. Urine culture showed pansensitive Escherichia coli. In terms of her shortness of breath she was noted to have chest x-ray that was negative. She was noted to have significant wheezing which was treated with steroids antibiotics and nebulizer therapy. Now her breathing is normalized. Her oxygen is normal. Patient also was noted to have elevated blood pressure therefore her blood pressure medications have been adjusted. They will continue need to be adjusted by her primary care provider.            Consults  None  Significant Tests:  See full reports for all details     Dg Chest 2 View  Result Date: 02/23/2017 CLINICAL DATA:  Emesis, congestion an UGI symptoms. EXAM: CHEST  2 VIEW COMPARISON:  Chest x-rays dated 01/16/2017 and 09/30/2015. FINDINGS: Cardiomegaly is stable. Atherosclerotic changes again noted at the aortic arch. Prominent pulmonary  arteries again noted suggesting chronic pulmonary artery hypertension. Lungs are clear. No pleural effusion or pneumothorax. Probable bibasilar bronchiectasis. No acute or suspicious osseous finding. IMPRESSION: 1. No active cardiopulmonary disease. No evidence of pneumonia or pulmonary edema. 2.  Aortic atherosclerosis. 3.  Stable cardiomegaly. 4.  Probable bibasilar bronchiectasis. 5.  Probable chronic pulmonary artery hypertension. Electronically Signed   By: Bary Richard M.D.   On: 02/23/2017 11:33   Dg Chest Port 1 View  Result Date: 02/26/2017 CLINICAL DATA:  Shortness of breath.  Cough. EXAM: PORTABLE CHEST 1 VIEW COMPARISON:  02/23/2017 FINDINGS: Chronic cardiomegaly. Tortuosity and calcification of the thoracic aorta. Pulmonary vascularity now appears normal. Lungs are clear. No acute bone abnormality. IMPRESSION: No acute abnormalities. Aortic atherosclerosis. Chronic cardiomegaly. Lungs are clear. Electronically Signed   By: Francene Boyers M.D.   On: 02/26/2017 11:53       Today   Subjective:   Daisy [name]  feeling better shortness of breath improved  Objective:   Blood pressure (!) 198/80, pulse 67, temperature 98 F (36.7 C), temperature source Oral, resp. rate 19, height  (1.575 m), weight 177 lb (80.3 kg), SpO2 91 %.  . No intake or output data in the 24 hours ending 02/27/17 1035  Exam VITAL SIGNS: Blood pressure (!) 198/80, pulse 67, temperature 98 F (36.7 C), temperature source Oral, resp. rate 19, height  (1.575 m), weight 177 lb (80.3 kg), SpO2 91 %.  GENERAL:  83 y.o.-year-old patient lying in the bed with no acute distress.  EYES: Pupils equal, round, reactive to light and accommodation. No scleral icterus.  Extraocular muscles intact.  HEENT: Head atraumatic, normocephalic. Oropharynx and nasopharynx clear.  NECK:  Supple, no jugular venous distention. No thyroid enlargement, no tenderness.  LUNGS: Normal breath sounds bilaterally, no wheezing,  rales,rhonchi or crepitation. No use of accessory muscles of respiration.  CARDIOVASCULAR: S1, S2 normal. No murmurs, rubs, or gallops.  ABDOMEN: Soft, nontender, nondistended. Bowel sounds present. No organomegaly or mass.  EXTREMITIES: No pedal edema, cyanosis, or clubbing.  NEUROLOGIC: Cranial nerves II through XII are intact. Muscle strength 5/5 in all extremities. Sensation intact. Gait not checked.  PSYCHIATRIC: The patient is alert and oriented x 3.  SKIN: No obvious rash, lesion, or ulcer.   Data Review     CBC w Diff:  Lab Results  Component Value Date   WBC 8.3 02/24/2017   HGB 11.9 (L) 02/24/2017   HGB 12.0 02/09/2014   HCT 36.3 02/24/2017   HCT 38.8 02/09/2014   PLT 216 02/24/2017   PLT 215 02/09/2014   LYMPHOPCT 8 02/23/2017   LYMPHOPCT 3.4 02/09/2014   MONOPCT 9 02/23/2017   MONOPCT 3.0 02/09/2014   EOSPCT 1 02/23/2017   EOSPCT 0.1 02/09/2014   BASOPCT 1 02/23/2017   BASOPCT 0.0 02/09/2014   CMP:  Lab Results  Component Value Date   NA 141 02/24/2017   NA 139 02/11/2014   K 4.2 02/24/2017   K 4.1 02/11/2014   CL 104 02/24/2017   CL 101 02/11/2014   CO2 30 02/24/2017   CO2 35 (H) 02/11/2014   BUN 17 02/24/2017   BUN 25 (H) 02/11/2014   CREATININE 1.22 (H) 02/24/2017   CREATININE 1.55 (H) 02/11/2014   PROT 7.6 02/23/2017   PROT 6.7 09/11/2013   ALBUMIN 3.6 02/23/2017   ALBUMIN 3.0 (L) 09/11/2013   BILITOT 0.7 02/23/2017   BILITOT 0.4 09/11/2013   ALKPHOS 59 02/23/2017   ALKPHOS 63 09/11/2013   AST 19 02/23/2017   AST 10 (L) 09/11/2013   ALT 8 (L) 02/23/2017   ALT 11 (L) 09/11/2013  .  Micro Results Recent Results (from the past 240 hour(s))  Urine culture     Status: Abnormal   Collection Time: 02/23/17 12:47 PM  Result Value Ref Range Status   Specimen Description URINE, RANDOM  Final   Special Requests NONE  Final   Culture >=100,000 COLONIES/mL ESCHERICHIA COLI (A)  Final   Report Status 02/25/2017 FINAL  Final   Organism ID,  Bacteria ESCHERICHIA COLI (A)  Final      Susceptibility   Escherichia coli - MIC*    AMPICILLIN <=2 SENSITIVE Sensitive     CEFAZOLIN <=4 SENSITIVE Sensitive     CEFTRIAXONE <=1 SENSITIVE Sensitive     CIPROFLOXACIN <=0.25 SENSITIVE Sensitive     GENTAMICIN <=1 SENSITIVE Sensitive     IMIPENEM <=0.25 SENSITIVE Sensitive     NITROFURANTOIN <=16 SENSITIVE Sensitive     TRIMETH/SULFA <=20 SENSITIVE Sensitive     AMPICILLIN/SULBACTAM <=2 SENSITIVE Sensitive     PIP/TAZO <=4 SENSITIVE Sensitive     Extended ESBL NEGATIVE Sensitive     * >=100,000 COLONIES/mL ESCHERICHIA COLI  MRSA PCR Screening     Status: Abnormal   Collection Time: 02/23/17  4:53 PM  Result Value Ref Range Status   MRSA by PCR POSITIVE (A) NEGATIVE Final    Comment:        The GeneXpert MRSA Assay (FDA approved for NASAL specimens only), is one component of a comprehensive MRSA colonization surveillance program. It is not intended to diagnose  MRSA infection nor to guide or monitor treatment for MRSA infections. RESULT CALLED TO, READ BACK BY AND VERIFIED WITH: DAVA ISLEY ON 02/23/17 AT 1833 QSD   Culture, expectorated sputum-assessment     Status: None   Collection Time: 02/25/17  9:57 AM  Result Value Ref Range Status   Specimen Description EXPECTORATED SPUTUM  Final   Special Requests Normal  Final   Sputum evaluation THIS SPECIMEN IS ACCEPTABLE FOR SPUTUM CULTURE  Final   Report Status 02/25/2017 FINAL  Final  Culture, respiratory (NON-Expectorated)     Status: None   Collection Time: 02/25/17  9:57 AM  Result Value Ref Range Status   Specimen Description EXPECTORATED SPUTUM  Final   Special Requests Normal Reflexed from [id number]  Final   Gram Stain   Final    ABUNDANT WBC PRESENT,BOTH PMN AND MONONUCLEAR FEW GRAM NEGATIVE RODS RARE GRAM POSITIVE COCCI IN PAIRS    Culture   Final    Consistent with normal respiratory flora. Performed at [facility], [address], [place], [place] [address]     Report Status 02/27/2017 FINAL  Final        Code Status Orders        Start     Ordered   02/23/17 1543  Do not attempt resuscitation (DNR)  Continuous    Question Answer Comment  In the event of cardiac or respiratory ARREST Do not call a "code blue"   In the event of cardiac or respiratory ARREST Do not perform Intubation, CPR, defibrillation or ACLS   In the event of cardiac or respiratory ARREST Use medication by any route, position, wound care, and other measures to relive pain and suffering. May use oxygen, suction and manual treatment of airway obstruction as needed for comfort.      02/23/17 1542    Code Status History    Date Active Date Inactive Code Status Order ID Comments User Context   11/11/2015 10:43 PM 11/12/2015  9:00 PM DNR [id number]  Gery Pray, MD Inpatient   09/30/2015  5:46 PM 10/01/2015  4:34 PM Full Code [id number]  Juanell Fairly, MD ED   06/05/2013  1:20 AM 06/09/2013  9:41 PM Full Code [id number]  Alison Murray, MD Inpatient    Advance Directive Documentation     Most Recent Value  Type of Advance Directive  Out of facility DNR (pink MOST or yellow form), Healthcare Power of Attorney  Pre-existing out of facility DNR order (yellow form or pink MOST form)  -  "MOST" Form in Place?  -          Follow-up Information    Kandyce Rud, MD Follow up in 1 week(s).   Specialty:  Family Medicine Contact information: [address] [facility] [place] [address] [phone]           Discharge Medications   Allergies as of 02/27/2017      Reactions   Aricept [donepezil Hcl] Other (See Comments)   Reaction:  Nightmares    Codeine Other (See Comments)   Reaction:  Unknown    Ketek [telithromycin] Other (See Comments)   Reaction:  Mood changes       Medication List    TAKE these medications   acetaminophen 325 MG tablet Commonly known as:  TYLENOL Take 650 mg by mouth every 4 (four)  hours as needed for fever.   aspirin EC 81 MG tablet Take 81 mg  by mouth daily.   beclomethasone 80 MCG/ACT inhaler Commonly known as:  QVAR Inhale 2 puffs into the lungs 2 (two) times daily.   carvedilol 12.5 MG tablet Commonly known as:  COREG Take 1 tablet (12.5 mg total) by mouth 2 (two) times daily with a meal.   cefUROXime 500 MG tablet Commonly known as:  CEFTIN Take 1 tablet (500 mg total) by mouth daily with lunch.   cetirizine 10 MG tablet Commonly known as:  ZYRTEC Take 10 mg by mouth daily.   citalopram 20 MG tablet Commonly known as:  CELEXA Take 30 mg by mouth daily.   fluticasone 50 MCG/ACT nasal spray Commonly known as:  FLONASE Place 2 sprays into both nostrils daily.   furosemide 20 MG tablet Commonly known as:  LASIX Take 20 mg by mouth daily.   guaiFENesin 100 MG/5ML Soln Commonly known as:  ROBITUSSIN Take 5 mLs (100 mg total) by mouth every 4 (four) hours as needed for cough or to loosen phlegm.   ipratropium-albuterol 0.5-2.5 (3) MG/3ML Soln Commonly known as:  DUONEB Take 3 mLs by nebulization 3 (three) times daily.   loperamide 2 MG tablet Commonly known as:  IMODIUM A-D Take 4 mg by mouth as needed for diarrhea or loose stools. Do not exceed 8 doses in 24 hours.   losartan 100 MG tablet Commonly known as:  COZAAR Take 1 tablet (100 mg total) by mouth daily. What changed:  medication strength  how much to take   magnesium hydroxide 400 MG/5ML suspension Commonly known as:  MILK OF MAGNESIA Take 30 mLs by mouth daily as needed for mild constipation.   mometasone 220 MCG/INH inhaler Commonly known as:  ASMANEX Inhale 1 puff into the lungs 2 (two) times daily.   montelukast 10 MG tablet Commonly known as:  SINGULAIR Take 10 mg by mouth at bedtime.   moxifloxacin 0.5 % ophthalmic solution Commonly known as:  VIGAMOX Place 1 drop into the left eye 3 (three) times daily.   nystatin cream Commonly known as:  MYCOSTATIN Apply 1  application topically 2 (two) times daily.   omeprazole 20 MG capsule Commonly known as:  PRILOSEC Take 20 mg by mouth daily before breakfast.   predniSONE 10 MG (21) Tbpk tablet Commonly known as:  STERAPRED UNI-PAK 21 TAB Start at  taper by  until complete   senna-docusate 8.6-50 MG tablet Commonly known as:  Senokot-S Take 1 tablet by mouth 2 (two) times daily.   simvastatin 20 MG tablet Commonly known as:  ZOCOR Take 20 mg by mouth daily.   vitamin B-12 1000 MCG tablet Commonly known as:  CYANOCOBALAMIN Take 1,000 mcg by mouth daily.            Durable Medical Equipment        Start     Ordered   02/27/17 1034  For home use only DME Nebulizer machine  Once    Question:  Patient needs a nebulizer to treat with the following condition  Answer:  COPD (chronic obstructive pulmonary disease) (HCC)   02/27/17 1033         Total Time in preparing paper work, data evaluation and todays exam - 35 minutes  Auburn Bilberry M.D on 02/27/2017 at 10:35 AM  [facility]   Office  [phone] [facility] [place] at [facility]

## 2017-03-08 DIAGNOSIS — N183 Chronic kidney disease, stage 3 (moderate): Secondary | ICD-10-CM | POA: Diagnosis not present

## 2017-03-08 DIAGNOSIS — I10 Essential (primary) hypertension: Secondary | ICD-10-CM | POA: Diagnosis not present

## 2017-03-08 DIAGNOSIS — J4541 Moderate persistent asthma with (acute) exacerbation: Secondary | ICD-10-CM | POA: Diagnosis not present

## 2017-03-09 ENCOUNTER — Emergency Department
Admission: EM | Admit: 2017-03-09 | Discharge: 2017-03-10 | Disposition: A | Discharge status: Home / Self Care | Payer: PPO | Attending: Emergency Medicine | Admitting: Emergency Medicine

## 2017-03-09 DIAGNOSIS — J449 Chronic obstructive pulmonary disease, unspecified: Secondary | ICD-10-CM | POA: Insufficient documentation

## 2017-03-09 DIAGNOSIS — Z87891 Personal history of nicotine dependence: Secondary | ICD-10-CM | POA: Insufficient documentation

## 2017-03-09 DIAGNOSIS — Z79899 Other long term (current) drug therapy: Secondary | ICD-10-CM | POA: Diagnosis not present

## 2017-03-09 DIAGNOSIS — N390 Urinary tract infection, site not specified: Secondary | ICD-10-CM | POA: Diagnosis not present

## 2017-03-09 DIAGNOSIS — N183 Chronic kidney disease, stage 3 (moderate): Secondary | ICD-10-CM | POA: Insufficient documentation

## 2017-03-09 DIAGNOSIS — I129 Hypertensive chronic kidney disease with stage 1 through stage 4 chronic kidney disease, or unspecified chronic kidney disease: Secondary | ICD-10-CM | POA: Insufficient documentation

## 2017-03-09 DIAGNOSIS — R9431 Abnormal electrocardiogram [ECG] [EKG]: Secondary | ICD-10-CM | POA: Diagnosis not present

## 2017-03-09 DIAGNOSIS — J45909 Unspecified asthma, uncomplicated: Secondary | ICD-10-CM | POA: Insufficient documentation

## 2017-03-09 DIAGNOSIS — R531 Weakness: Secondary | ICD-10-CM | POA: Diagnosis not present

## 2017-03-09 DIAGNOSIS — R05 Cough: Secondary | ICD-10-CM | POA: Diagnosis not present

## 2017-03-09 DIAGNOSIS — Z7982 Long term (current) use of aspirin: Secondary | ICD-10-CM | POA: Diagnosis not present

## 2017-03-10 ENCOUNTER — Emergency Department: Payer: PPO

## 2017-03-10 DIAGNOSIS — R05 Cough: Secondary | ICD-10-CM | POA: Diagnosis not present

## 2017-03-10 LAB — BASIC METABOLIC PANEL
ANION GAP: 10 (ref 5–15)
BUN: 26 mg/dL — ABNORMAL HIGH (ref 6–20)
CALCIUM: 8 mg/dL — AB (ref 8.9–10.3)
CO2: 24 mmol/L (ref 22–32)
Chloride: 99 mmol/L — ABNORMAL LOW (ref 101–111)
Creatinine, Ser: 1.22 mg/dL — ABNORMAL HIGH (ref 0.44–1.00)
GFR, EST AFRICAN AMERICAN: 43 mL/min — AB (ref >60)
GFR, EST NON AFRICAN AMERICAN: 37 mL/min — AB (ref >60)
GLUCOSE: 132 mg/dL — AB (ref 65–99)
Potassium: 4.1 mmol/L (ref 3.5–5.1)
SODIUM: 133 mmol/L — AB (ref 135–145)

## 2017-03-10 LAB — URINALYSIS, COMPLETE (UACMP) WITH MICROSCOPIC
Bilirubin Urine: NEGATIVE
Glucose, UA: NEGATIVE mg/dL
Hgb urine dipstick: NEGATIVE
KETONES UR: NEGATIVE mg/dL
NITRITE: NEGATIVE
PROTEIN: 30 mg/dL — AB
Specific Gravity, Urine: 1.017 (ref 1.005–1.030)
pH: 5 (ref 5.0–8.0)

## 2017-03-10 LAB — CBC
HEMATOCRIT: 35.4 % (ref 35.0–47.0)
HEMOGLOBIN: 11.5 g/dL — AB (ref 12.0–16.0)
MCH: 28.3 pg (ref 26.0–34.0)
MCHC: 32.5 g/dL (ref 32.0–36.0)
MCV: 86.9 fL (ref 80.0–100.0)
Platelets: 188 10*3/uL (ref 150–440)
RBC: 4.07 MIL/uL (ref 3.80–5.20)
RDW: 15.5 % — AB (ref 11.5–14.5)
WBC: 17 10*3/uL — ABNORMAL HIGH (ref 3.6–11.0)

## 2017-03-10 MED ORDER — CEFTRIAXONE SODIUM IN DEXTROSE 20 MG/ML IV SOLN
1.0000 g | INTRAVENOUS | Status: AC
Start: 2017-03-10 — End: 2017-03-10
  Administered 2017-03-10: 1 g via INTRAVENOUS
  Filled 2017-03-10: qty 50

## 2017-03-10 MED ORDER — CEPHALEXIN 500 MG PO CAPS: 500.0000 mg | ORAL_CAPSULE | Freq: Two times a day (BID) | ORAL | 0 refills | Status: DC

## 2017-03-10 NOTE — Discharge Instructions (Signed)
Ms. Daisy [name]'s evaluation in the Emergency Department (ED) was relatively reassuring.  Although she does have a urinary tract infection (UTI), she has no evidence of pneumonia and there was no gross abnormality on her lab work.  We discussed it extensively and all agreed that she would be most comfortable at home with assistance from family and staff with a course of outpatient antibiotics.  Please make sure she takes the full course of treatment.  Follow up with her regular doctor at the next available opportunity.    Return to the emergency department if you develop new or worsening symptoms that concern you.

## 2017-03-10 NOTE — ED Triage Notes (Signed)
Patient to ED from the [facility] for progressive weakness. Was seen here about two weeks ago and diagnosed with bronchitis. Family has noticed that she has been progressively getting weaker. Has also had a UTI per daughter. Was seen at PCP yesterday who felt she wasn't getting any better. Here tonight for evaluation.

## 2017-03-10 NOTE — ED Notes (Signed)
When patient responds to questions, pt. Has a non-productive cough.

## 2017-03-10 NOTE — ED Provider Notes (Signed)
[facility] Emergency Department Provider Note  ____________________________________________   None    (approximate)  I have reviewed the triage vital signs and the nursing notes.   HISTORY  Chief Complaint Weakness    HPI Daisy [name] is a 83 y.o. female with extensive past medical history as listed below who presents by EMS for progressive weakness.  Her family is present at bedside including her legal guardian (1 of her daughters).  They report that she was admitted to the hospital for bronchitis a couple of weeks ago and after discharge she is not improved as they hoped she would.  She lives in assisted living but has a great deal of help from her 7 children.  They are all concerned about her increasing weakness and fatigue.  She continues to have a cough frequently productive of thick sputum.  She has had no known fevers.  She has still been eating and drinking and denies chest pain and abdominal pain.  She frequently has urinary tract infections.  They describe her symptoms as gradual in onset and severe.  At baseline she is only ambulatory with assistance to the bathroom.  I verified verbally and with her paperwork that she is DNR/DNI.  Past Medical History:  Diagnosis Date  . Acute renal failure (HCC)   . Anemia   . Asthma   . Cerebral artery occlusion    with cerebral infarction  . Dementia   . Depression 06/05/2013  . Edema   . Essential hypertension, benign 06/05/2013  . GERD (gastroesophageal reflux disease) 06/05/2013  . Heart attack (HCC)   . Osteoarthritis   . Other and unspecified hyperlipidemia 06/05/2013  . Paronychia, toe   . Recurrent UTI   . Stroke [facility](HCC)     Patient Active Problem List   Diagnosis Date Noted  . Sepsis (HCC) 02/23/2017  . Rectal bleed 11/11/2015  . Dementia 11/11/2015  . COPD (chronic obstructive pulmonary disease) (HCC) 11/11/2015  . Dyslipidemia 11/11/2015  . Infection of urinary tract 10/04/2015  .  Escherichia coli (E. coli) infection 10/04/2015  . CKD (chronic kidney disease), stage III (HCC) 10/04/2015  . Hyperglycemia 10/04/2015  . MRSA carrier 10/04/2015  . Leukocytosis 10/04/2015  . Thrombocytopenia (HCC) 10/04/2015  . Closed right hip fracture (HCC) 09/30/2015  . Recurrent UTI 11/12/2014  . Microscopic hematuria 11/12/2014  . Atrophic vaginitis 11/12/2014  . CVA (cerebral infarction) 06/05/2013  . Hypotension, unspecified 06/05/2013  . Other and unspecified hyperlipidemia 06/05/2013  . Acute renal failure (HCC) 06/05/2013  . Leukocytosis, unspecified 06/05/2013  . Essential hypertension, benign 06/05/2013  . Depression 06/05/2013  . GERD (gastroesophageal reflux disease) 06/05/2013    Past Surgical History:  Procedure Laterality Date  . CARDIAC SURGERY     Left BBB  . FEMUR IM NAIL Right 10/01/2015   Procedure: INTRAMEDULLARY (IM) NAIL FEMORAL;  Surgeon: Juanell FairlyKevin Krasinski, MD;  Location: [facility] ORS;  Service: Orthopedics;  Laterality: Right;  . KNEE SURGERY Right     Prior to Admission medications   Medication Sig Start Date End Date Taking? Authorizing Provider  acetaminophen (TYLENOL) 325 MG tablet Take 650 mg by mouth every 4 (four) hours as needed for fever.   Yes [provider]  aspirin EC 81 MG tablet Take 81 mg by mouth daily.   Yes [provider]  beclomethasone (QVAR) 80 MCG/ACT inhaler Inhale 2 puffs into the lungs 2 (two) times daily.   Yes [provider]  carvedilol (COREG) 12.5 MG tablet Take  1 tablet (12.5 mg total) by mouth 2 (two) times daily with a meal. 06/09/13  Yes Rodolph Bong, MD  cetirizine (ZYRTEC) 10 MG tablet Take 10 mg by mouth daily.   Yes [provider]  citalopram (CELEXA) 20 MG tablet Take 30 mg by mouth daily.    Yes [provider]  fluticasone (FLONASE) 50 MCG/ACT nasal spray Place 2 sprays into both nostrils daily.   Yes [provider]  furosemide (LASIX) 20 MG tablet Take  20 mg by mouth daily.    Yes [provider]  guaiFENesin (ROBITUSSIN) 100 MG/5ML SOLN Take 5 mLs (100 mg total) by mouth every 4 (four) hours as needed for cough or to loosen phlegm. 02/27/17  Yes Auburn Bilberry, MD  ipratropium-albuterol (DUONEB) 0.5-2.5 (3) MG/3ML SOLN Take 3 mLs by nebulization 3 (three) times daily. 02/27/17  Yes Auburn Bilberry, MD  loperamide (IMODIUM A-D) 2 MG tablet Take 4 mg by mouth as needed for diarrhea or loose stools. Do not exceed 8 doses in 24 hours.   Yes [provider]  losartan (COZAAR) 100 MG tablet Take 1 tablet (100 mg total) by mouth daily. 02/27/17 02/27/18 Yes Auburn Bilberry, MD  magnesium hydroxide (MILK OF MAGNESIA) 400 MG/5ML suspension Take 30 mLs by mouth daily as needed for mild constipation.   Yes [provider]  montelukast (SINGULAIR) 10 MG tablet Take 10 mg by mouth at bedtime.   Yes [provider]  moxifloxacin (VIGAMOX) 0.5 % ophthalmic solution Place 1 drop into the left eye 3 (three) times daily.   Yes [provider]  nystatin cream (MYCOSTATIN) Apply 1 application topically 2 (two) times daily.   Yes [provider]  omeprazole (PRILOSEC) 20 MG capsule Take 20 mg by mouth daily before breakfast.    Yes [provider]  senna-docusate (SENOKOT-S) 8.6-50 MG tablet Take 1 tablet by mouth 2 (two) times daily.    Yes [provider]  simvastatin (ZOCOR) 20 MG tablet Take 20 mg by mouth daily.    Yes [provider]  vitamin B-12 (CYANOCOBALAMIN) 1000 MCG tablet Take 1,000 mcg by mouth daily.   Yes [provider]  cephALEXin (KEFLEX) 500 MG capsule Take 1 capsule (500 mg total) by mouth 2 (two) times daily. 03/10/17   Loleta Rose, MD    Allergies Aricept Palma Holter hcl]; Codeine; and Ketek [telithromycin]  Family History  Problem Relation Age of Onset  . Prostate cancer Son   . Cancer Mother        Oral  . Colon cancer Sister   . Kidney  disease Neg Hx   . Bladder Cancer Neg Hx     Social History Social History  Substance Use Topics  . Smoking status: Former Games developer  . Smokeless tobacco: Never Used     Comment: quit 50 + years  . Alcohol use 0.0 oz/week     Comment: occasionally    Review of Systems Constitutional: No fever/chills.  Generalized weakness/fatigue Eyes: No visual changes. ENT: No sore throat. Cardiovascular: Denies chest pain. Respiratory: Denies shortness of breath.  Frequent productive cough and recent admission for bronchitis Gastrointestinal: No abdominal pain.  No nausea, no vomiting.  No diarrhea.  No constipation. Genitourinary: Negative for dysuria. Musculoskeletal: Negative for neck pain.  Negative for back pain. Integumentary: Negative for rash. Neurological: Negative for headaches, focal weakness or numbness.   ____________________________________________   PHYSICAL EXAM:  VITAL SIGNS: ED Triage Vitals  Enc Vitals Group  BP 03/10/17 0004 (!) 104/59     Pulse Rate 03/10/17 0004 85     Resp 03/10/17 0004 20     Temp 03/10/17 0004 98.1 F (36.7 C)     Temp Source 03/10/17 0004 Oral     SpO2 03/10/17 0004 92 %     Weight 03/10/17 0005 88.5 kg (195 lb)     Height 03/10/17 0005 1.524 m (5')     Head Circumference --      Peak Flow --      Pain Score --      Pain Loc --      Pain Edu? --      Excl. in GC? --     Constitutional: Elderly, sleeping but awakens to voice, denies acute pain, endorses fatigue.  Oriented to self, family, and location Eyes: Conjunctivae are normal.  Head: Atraumatic. Nose: No congestion/rhinnorhea. Mouth/Throat: Mucous membranes are moist. Neck: No stridor.  No meningeal signs.   Cardiovascular: Normal rate, regular rhythm. Good peripheral circulation. Grossly normal heart sounds. Respiratory: Normal respiratory effort.  No retractions. Lungs CTAB. Gastrointestinal: Soft and nontender. No distention.  Musculoskeletal: No lower extremity  tenderness nor edema. No gross deformities of extremities. Neurologic:  Normal speech and language. No gross focal neurologic deficits are appreciated.  Skin:  Skin is warm, dry and intact. No rash noted.   ____________________________________________   LABS (all labs ordered are listed, but only abnormal results are displayed)  Labs Reviewed  BASIC METABOLIC PANEL - Abnormal; Notable for the following:       Result Value   Sodium 133 (*)    Chloride 99 (*)    Glucose, Bld 132 (*)    BUN 26 (*)    Creatinine, Ser 1.22 (*)    Calcium 8.0 (*)    GFR calc non Af Amer 37 (*)    GFR calc Af Amer 43 (*)    All other components within normal limits  CBC - Abnormal; Notable for the following:    WBC 17.0 (*)    Hemoglobin 11.5 (*)    RDW 15.5 (*)    All other components within normal limits  URINALYSIS, COMPLETE (UACMP) WITH MICROSCOPIC - Abnormal; Notable for the following:    Color, Urine AMBER (*)    APPearance TURBID (*)    Protein, ur 30 (*)    Leukocytes, UA LARGE (*)    Bacteria, UA FEW (*)    Squamous Epithelial / LPF 0-5 (*)    All other components within normal limits  URINE CULTURE   ____________________________________________  EKG  ED ECG REPORT I, Bright Spielmann, the attending physician, personally viewed and interpreted this ECG.  Date: 03/10/2017 EKG Time: 00: 03 Rate: 84 Rhythm: normal sinus rhythm w/ PACs, LBBB (present in prior ECGs) QRS Axis: normal Intervals: prolonged PR at , prolonged QTc at ST/T Wave abnormalities: Non-specific ST segment / T-wave changes, but no evidence of acute ischemia. Narrative Interpretation: no evidence of acute ischemia, no significant change from prior ECGs   ____________________________________________  RADIOLOGY   Dg Chest Portable 1 View  Result Date: 03/10/2017 CLINICAL DATA:  Productive cough, recent diagnosis of bronchitis. EXAM: PORTABLE CHEST 1 VIEW COMPARISON:  02/26/2017 FINDINGS: Cardiomegaly  with aortic atherosclerosis. No acute pneumonic consolidation, effusion or pneumothorax. Degenerative changes are seen about both glenohumeral and AC joints. No acute nor suspicious osseous abnormality. IMPRESSION: Stable cardiomegaly with aortic atherosclerosis.  No active disease. Electronically Signed   By: Rene Kocher.D.  On: 03/10/2017 01:28    ____________________________________________   PROCEDURES  Critical Care performed: No   Procedure(s) performed:   Procedures   ____________________________________________   INITIAL IMPRESSION / ASSESSMENT AND PLAN / ED COURSE  As part of my medical decision making, I reviewed the following data within the electronic MEDICAL RECORD NUMBER History obtained from family, Nursing notes reviewed and incorporated, Labs reviewed , Old EKG reviewed, Old chart reviewed and Notes from prior ED visits  In this elderly patient with extensive chronic medical history the differential diagnosis is broad but most appropriately includes infection (UTI and/or pneumonia), CVA/TIA, electrolyte abnormality, dehydration, and acute on chronic debilitation especially in the setting of recent illness and hospitalization.  I had an extensive discussion with the 3 adult children that are currently present and I had my usual and customary discussion about elderly patients, debilitation after hospitalization, etc.  I explained that we would evaluate broadly for signs of acute infection and treat appropriately but that her symptoms of fatigue and generalized weakness may simply be the result of debilitation and declining overall health.  They understand and appreciate an evaluation but also understand treated or for which she requires additional hospitalization.  They also understand and agree that hospitalization in this patient's age group can be detrimental if outpatient treatment is also available.  They also adamantly state that they are not interested in increasing her  level of care such as in a skilled nursing facility or rehab facility and states that they wish to continue working with her in her assisted living facility.  Lab work is generally reassuring and stable from prior.  Urinalysis is pending.  Leukocytosis is 17 which is a steroids for bronchitis.  I will attempt to verify this.  Patient is sleeping comfortably but awakens easily and smiles and reports no distress.   Clinical Course as of Mar 10 832  Wynelle Link Mar 10, 2017  0225 Verified With the family that the patient received steroids when she was in the hospital which likely accounts for the leukocytosis.  She once again has a urinary tract infection which, based on prior urine cultures, is likely E. coli.  A urine culture has been sent and I am ordering ceftriaxone.  I had an extensive discussion with the family again and they are comfortable taking her home rather than admitting her again to the hospital and I think that is appropriate and in fact was my recommendation.  I explained to them that we cannot predict how she will respond and that she may in fact worsen but I think this is the best course of action and they agree.  [CF]    Clinical Course User Index [CF] Loleta Rose, MD    ____________________________________________  FINAL CLINICAL IMPRESSION(S) / ED DIAGNOSES  Final diagnoses:  Generalized weakness  Urinary tract infection without hematuria, site unspecified     MEDICATIONS GIVEN DURING THIS VISIT:  Medications  cefTRIAXone (ROCEPHIN) 1 g in dextrose 5 % 50 mL IVPB - Premix (0 g Intravenous Stopped 03/10/17 0344)     NEW OUTPATIENT MEDICATIONS STARTED DURING THIS VISIT:  Discharge Medication List as of 03/10/2017  2:57 AM    START taking these medications   Details  cephALEXin (KEFLEX) 500 MG capsule Take 1 capsule (500 mg total) by mouth 2 (two) times daily., Starting Sun 03/10/2017, Print        Discharge Medication List as of 03/10/2017  2:57 AM       Discharge Medication List as of  03/10/2017  2:57 AM       Note:  This document was prepared using Dragon voice recognition software and may include unintentional dictation errors.    Loleta Rose, MD 03/10/17 [phone]

## 2017-03-10 NOTE — ED Notes (Signed)
Called "the [facility]" [phone] and [phone] no answer or the message service would just discontinue call.

## 2017-03-10 NOTE — ED Notes (Addendum)
Pt. States pt. Was feeling weaker tonight.  Pt. Was unable to stand on her feet with assist from walker.  Pt. Was admitted at this hospital about two weeks ago for bronchitis.  Pt. Has had UTI's in the past.  Pt. Can be aroused when her name is spoken.

## 2017-03-12 ENCOUNTER — Encounter: Payer: Self-pay | Admitting: Emergency Medicine

## 2017-03-12 ENCOUNTER — Observation Stay
Admission: EM | Admit: 2017-03-12 | Discharge: 2017-03-13 | Disposition: A | Discharge status: Home / Self Care | Payer: PPO | Attending: Internal Medicine | Admitting: Internal Medicine

## 2017-03-12 DIAGNOSIS — I700 Atherosclerosis of aorta: Secondary | ICD-10-CM | POA: Diagnosis not present

## 2017-03-12 DIAGNOSIS — I252 Old myocardial infarction: Secondary | ICD-10-CM | POA: Diagnosis not present

## 2017-03-12 DIAGNOSIS — Z8673 Personal history of transient ischemic attack (TIA), and cerebral infarction without residual deficits: Secondary | ICD-10-CM | POA: Insufficient documentation

## 2017-03-12 DIAGNOSIS — R531 Weakness: Secondary | ICD-10-CM

## 2017-03-12 DIAGNOSIS — Z888 Allergy status to other drugs, medicaments and biological substances status: Secondary | ICD-10-CM | POA: Insufficient documentation

## 2017-03-12 DIAGNOSIS — N390 Urinary tract infection, site not specified: Secondary | ICD-10-CM | POA: Diagnosis not present

## 2017-03-12 DIAGNOSIS — B952 Enterococcus as the cause of diseases classified elsewhere: Secondary | ICD-10-CM | POA: Insufficient documentation

## 2017-03-12 DIAGNOSIS — I10 Essential (primary) hypertension: Secondary | ICD-10-CM | POA: Diagnosis not present

## 2017-03-12 DIAGNOSIS — F329 Major depressive disorder, single episode, unspecified: Secondary | ICD-10-CM | POA: Insufficient documentation

## 2017-03-12 DIAGNOSIS — R053 Chronic cough: Secondary | ICD-10-CM

## 2017-03-12 DIAGNOSIS — Z79899 Other long term (current) drug therapy: Secondary | ICD-10-CM | POA: Diagnosis not present

## 2017-03-12 DIAGNOSIS — K219 Gastro-esophageal reflux disease without esophagitis: Secondary | ICD-10-CM | POA: Insufficient documentation

## 2017-03-12 DIAGNOSIS — Z87891 Personal history of nicotine dependence: Secondary | ICD-10-CM | POA: Diagnosis not present

## 2017-03-12 DIAGNOSIS — Z87440 Personal history of urinary (tract) infections: Secondary | ICD-10-CM | POA: Insufficient documentation

## 2017-03-12 DIAGNOSIS — R197 Diarrhea, unspecified: Secondary | ICD-10-CM | POA: Insufficient documentation

## 2017-03-12 DIAGNOSIS — J449 Chronic obstructive pulmonary disease, unspecified: Secondary | ICD-10-CM | POA: Insufficient documentation

## 2017-03-12 DIAGNOSIS — N183 Chronic kidney disease, stage 3 (moderate): Secondary | ICD-10-CM | POA: Diagnosis not present

## 2017-03-12 DIAGNOSIS — E785 Hyperlipidemia, unspecified: Secondary | ICD-10-CM | POA: Insufficient documentation

## 2017-03-12 DIAGNOSIS — I1310 Hypertensive heart and chronic kidney disease without heart failure, with stage 1 through stage 4 chronic kidney disease, or unspecified chronic kidney disease: Secondary | ICD-10-CM | POA: Insufficient documentation

## 2017-03-12 DIAGNOSIS — Z66 Do not resuscitate: Secondary | ICD-10-CM | POA: Diagnosis not present

## 2017-03-12 DIAGNOSIS — F0390 Unspecified dementia without behavioral disturbance: Secondary | ICD-10-CM | POA: Insufficient documentation

## 2017-03-12 DIAGNOSIS — Z885 Allergy status to narcotic agent status: Secondary | ICD-10-CM | POA: Diagnosis not present

## 2017-03-12 DIAGNOSIS — R627 Adult failure to thrive: Secondary | ICD-10-CM

## 2017-03-12 DIAGNOSIS — R05 Cough: Secondary | ICD-10-CM | POA: Diagnosis not present

## 2017-03-12 DIAGNOSIS — Z7982 Long term (current) use of aspirin: Secondary | ICD-10-CM | POA: Diagnosis not present

## 2017-03-12 DIAGNOSIS — M1990 Unspecified osteoarthritis, unspecified site: Secondary | ICD-10-CM | POA: Diagnosis not present

## 2017-03-12 LAB — URINALYSIS, COMPLETE (UACMP) WITH MICROSCOPIC
Bacteria, UA: NONE SEEN
Bilirubin Urine: NEGATIVE
GLUCOSE, UA: NEGATIVE mg/dL
Ketones, ur: NEGATIVE mg/dL
NITRITE: NEGATIVE
PH: 6 (ref 5.0–8.0)
Protein, ur: NEGATIVE mg/dL
Specific Gravity, Urine: 1.004 — ABNORMAL LOW (ref 1.005–1.030)

## 2017-03-12 LAB — LIPASE, BLOOD: Lipase: 27 U/L (ref 11–51)

## 2017-03-12 LAB — COMPREHENSIVE METABOLIC PANEL
ALK PHOS: 46 U/L (ref 38–126)
ALT: 9 U/L — AB (ref 14–54)
AST: 12 U/L — ABNORMAL LOW (ref 15–41)
Albumin: 2.9 g/dL — ABNORMAL LOW (ref 3.5–5.0)
Anion gap: 9 (ref 5–15)
BILIRUBIN TOTAL: 0.8 mg/dL (ref 0.3–1.2)
BUN: 20 mg/dL (ref 6–20)
CALCIUM: 8.3 mg/dL — AB (ref 8.9–10.3)
CO2: 25 mmol/L (ref 22–32)
CREATININE: 1.06 mg/dL — AB (ref 0.44–1.00)
Chloride: 102 mmol/L (ref 101–111)
GFR, EST AFRICAN AMERICAN: 51 mL/min — AB (ref >60)
GFR, EST NON AFRICAN AMERICAN: 44 mL/min — AB (ref >60)
Glucose, Bld: 118 mg/dL — ABNORMAL HIGH (ref 65–99)
Potassium: 4.1 mmol/L (ref 3.5–5.1)
SODIUM: 136 mmol/L (ref 135–145)
TOTAL PROTEIN: 6.6 g/dL (ref 6.5–8.1)

## 2017-03-12 LAB — URINE CULTURE: SPECIAL REQUESTS: NORMAL

## 2017-03-12 LAB — CBC
HCT: 32.7 % — ABNORMAL LOW (ref 35.0–47.0)
Hemoglobin: 10.6 g/dL — ABNORMAL LOW (ref 12.0–16.0)
MCH: 28.2 pg (ref 26.0–34.0)
MCHC: 32.5 g/dL (ref 32.0–36.0)
MCV: 86.7 fL (ref 80.0–100.0)
PLATELETS: 177 10*3/uL (ref 150–440)
RBC: 3.77 MIL/uL — AB (ref 3.80–5.20)
RDW: 15.3 % — ABNORMAL HIGH (ref 11.5–14.5)
WBC: 10.3 10*3/uL (ref 3.6–11.0)

## 2017-03-12 MED ORDER — ACETAMINOPHEN 325 MG PO TABS: 650.0000 mg | ORAL_TABLET | ORAL | Status: DC | PRN

## 2017-03-12 MED ORDER — IPRATROPIUM-ALBUTEROL 0.5-2.5 (3) MG/3ML IN SOLN
3.0000 mL | Freq: Three times a day (TID) | RESPIRATORY_TRACT | Status: DC
  Filled 2017-03-12: qty 3

## 2017-03-12 MED ORDER — CARVEDILOL 6.25 MG PO TABS
12.5000 mg | ORAL_TABLET | Freq: Two times a day (BID) | ORAL | Status: DC
  Administered 2017-03-13: 12.5 mg via ORAL
  Filled 2017-03-12 (×2): qty 2

## 2017-03-12 MED ORDER — LORATADINE 10 MG PO TABS
10.0000 mg | ORAL_TABLET | Freq: Every day | ORAL | Status: DC
  Administered 2017-03-13: 10 mg via ORAL
  Filled 2017-03-12: qty 1

## 2017-03-12 MED ORDER — CITALOPRAM HYDROBROMIDE 20 MG PO TABS
30.0000 mg | ORAL_TABLET | Freq: Every day | ORAL | Status: DC
  Administered 2017-03-13: 10:00:00 30 mg via ORAL
  Filled 2017-03-12: qty 2

## 2017-03-12 MED ORDER — VITAMIN B-12 1000 MCG PO TABS
1000.0000 ug | ORAL_TABLET | Freq: Every day | ORAL | Status: DC
  Administered 2017-03-13: 1000 ug via ORAL
  Filled 2017-03-12: qty 1

## 2017-03-12 MED ORDER — SODIUM CHLORIDE 0.9 % IV SOLN
INTRAVENOUS | Status: DC
  Administered 2017-03-12: 21:00:00 via INTRAVENOUS

## 2017-03-12 MED ORDER — FUROSEMIDE 40 MG PO TABS
20.0000 mg | ORAL_TABLET | Freq: Every day | ORAL | Status: DC
  Administered 2017-03-13: 20 mg via ORAL
  Filled 2017-03-12: qty 1

## 2017-03-12 MED ORDER — AMOXICILLIN-POT CLAVULANATE 875-125 MG PO TABS
1.0000 | ORAL_TABLET | Freq: Two times a day (BID) | ORAL | Status: DC
  Administered 2017-03-13: 08:00:00 1 via ORAL
  Filled 2017-03-12: qty 1

## 2017-03-12 MED ORDER — GUAIFENESIN 100 MG/5ML PO SOLN
5.0000 mL | ORAL | Status: DC | PRN
  Filled 2017-03-12: qty 5

## 2017-03-12 MED ORDER — MONTELUKAST SODIUM 10 MG PO TABS
10.0000 mg | ORAL_TABLET | Freq: Every day | ORAL | Status: DC
  Administered 2017-03-12: 10 mg via ORAL
  Filled 2017-03-12: qty 1

## 2017-03-12 MED ORDER — DOCUSATE SODIUM 100 MG PO CAPS: 100.0000 mg | ORAL_CAPSULE | Freq: Two times a day (BID) | ORAL | Status: DC | PRN

## 2017-03-12 MED ORDER — BECLOMETHASONE DIPROPIONATE 80 MCG/ACT IN AERS
2.0000 | INHALATION_SPRAY | Freq: Two times a day (BID) | RESPIRATORY_TRACT | Status: DC
Start: 2017-03-12 — End: 2017-03-12

## 2017-03-12 MED ORDER — FLUTICASONE PROPIONATE 50 MCG/ACT NA SUSP
2.0000 | Freq: Every day | NASAL | Status: DC
  Administered 2017-03-13: 2 via NASAL
  Filled 2017-03-12: qty 16

## 2017-03-12 MED ORDER — MOXIFLOXACIN HCL 0.5 % OP SOLN
1.0000 [drp] | Freq: Three times a day (TID) | OPHTHALMIC | Status: DC
  Administered 2017-03-12 – 2017-03-13 (×2): 1 [drp] via OPHTHALMIC
  Filled 2017-03-12: qty 3

## 2017-03-12 MED ORDER — AMLODIPINE BESYLATE 5 MG PO TABS
5.0000 mg | ORAL_TABLET | Freq: Every day | ORAL | Status: DC
  Administered 2017-03-12 – 2017-03-13 (×2): 5 mg via ORAL
  Filled 2017-03-12 (×2): qty 1

## 2017-03-12 MED ORDER — HEPARIN SODIUM (PORCINE) 5000 UNIT/ML IJ SOLN
5000.0000 [IU] | Freq: Three times a day (TID) | INTRAMUSCULAR | Status: DC
  Administered 2017-03-12 – 2017-03-13 (×2): 5000 [IU] via SUBCUTANEOUS
  Filled 2017-03-12 (×2): qty 1

## 2017-03-12 MED ORDER — PANTOPRAZOLE SODIUM 40 MG PO TBEC
40.0000 mg | DELAYED_RELEASE_TABLET | Freq: Every day | ORAL | Status: DC
  Administered 2017-03-13: 40 mg via ORAL
  Filled 2017-03-12: qty 1

## 2017-03-12 MED ORDER — ASPIRIN EC 81 MG PO TBEC
81.0000 mg | DELAYED_RELEASE_TABLET | Freq: Every day | ORAL | Status: DC
Start: 2017-03-13 — End: 2017-03-13
  Administered 2017-03-13: 81 mg via ORAL
  Filled 2017-03-12: qty 1

## 2017-03-12 MED ORDER — BUDESONIDE 0.25 MG/2ML IN SUSP
0.2500 mg | Freq: Two times a day (BID) | RESPIRATORY_TRACT | Status: DC
  Administered 2017-03-13: 08:00:00 0.25 mg via RESPIRATORY_TRACT
  Filled 2017-03-12: qty 2

## 2017-03-12 MED ORDER — LOSARTAN POTASSIUM 50 MG PO TABS
100.0000 mg | ORAL_TABLET | Freq: Every day | ORAL | Status: DC
  Administered 2017-03-13: 10:00:00 100 mg via ORAL
  Filled 2017-03-12: qty 2

## 2017-03-12 MED ORDER — SIMVASTATIN 10 MG PO TABS
20.0000 mg | ORAL_TABLET | Freq: Every day | ORAL | Status: DC
  Filled 2017-03-12: qty 2

## 2017-03-12 MED ORDER — AMOXICILLIN 500 MG PO CAPS
500.0000 mg | ORAL_CAPSULE | Freq: Two times a day (BID) | ORAL | Status: DC
  Administered 2017-03-12: 500 mg via ORAL
  Filled 2017-03-12: qty 1

## 2017-03-12 NOTE — Progress Notes (Signed)
Pt's family asked Fisher to visit with pt in ED18. Chaplain met pt and family at bedside. Perla spoke with family briefly and offered prayers for the pt and family. CH is available to follow-up pt as needed.   03/12/17 1900  Clinical Encounter Type  Visited With Patient;Patient and family together  Visit Type Initial;Spiritual support  Referral From Family  Consult/Referral To Chaplain  Spiritual Encounters  Spiritual Needs Prayer

## 2017-03-12 NOTE — ED Notes (Signed)
Attempted report to floor. Accepting RN not available to take report. Secretary given name and number and states floor RN will call back

## 2017-03-12 NOTE — H&P (Signed)
[employer] [facility] [place] at [facility]   PATIENT NAME: Daisy [name]    MR#:  [id number]  DATE OF BIRTH:  1922-05-06  DATE OF ADMISSION:  03/12/2017  PRIMARY CARE PHYSICIAN: Kandyce Rud, MD   REQUESTING/REFERRING PHYSICIAN: Ancil Boozer  CHIEF COMPLAINT:   Chief Complaint  Patient presents with  . Diarrhea  . Cough    HISTORY OF PRESENT ILLNESS: Daisy [name]  is a 83 y.o. female with a known history of dementia, recurrent UTI, stroke- treated twice for UTI in last one month. Was in ER 3 days ago for UTI- sent back to NH with keflex. Started having diarrhea and sent here. Ur cx report from 2 days ago shows E fecalis and could not give stool sample. BP running high and pt is not eating good, so ER suggest to keep in hospital.  PAST MEDICAL HISTORY:   Past Medical History:  Diagnosis Date  . Acute renal failure (HCC)   . Anemia   . Asthma   . Cerebral artery occlusion    with cerebral infarction  . Dementia   . Depression 06/05/2013  . Edema   . Essential hypertension, benign 06/05/2013  . GERD (gastroesophageal reflux disease) 06/05/2013  . Heart attack (HCC)   . Osteoarthritis   . Other and unspecified hyperlipidemia 06/05/2013  . Paronychia, toe   . Recurrent UTI   . Stroke [facility])     PAST SURGICAL HISTORY: Past Surgical History:  Procedure Laterality Date  . CARDIAC SURGERY     Left BBB  . FEMUR IM NAIL Right 10/01/2015   Procedure: INTRAMEDULLARY (IM) NAIL FEMORAL;  Surgeon: Juanell Fairly, MD;  Location: [facility] ORS;  Service: Orthopedics;  Laterality: Right;  . KNEE SURGERY Right     SOCIAL HISTORY:  Social History  Substance Use Topics  . Smoking status: Former Games developer  . Smokeless tobacco: Never Used     Comment: quit 50 + years  . Alcohol use 0.0 oz/week     Comment: occasionally    FAMILY HISTORY:  Family History  Problem Relation Age of Onset  . Prostate cancer Son   . Cancer Mother        Oral  . Colon cancer Sister   . Kidney  disease Neg Hx   . Bladder Cancer Neg Hx     DRUG ALLERGIES:  Allergies  Allergen Reactions  . Aricept [Donepezil Hcl] Other (See Comments)    Reaction:  Nightmares   . Codeine Other (See Comments)    Reaction:  Unknown   . Ketek [Telithromycin] Other (See Comments)    Reaction:  Mood changes     REVIEW OF SYSTEMS:   Pt have dementia, could not give detailed history.  MEDICATIONS AT HOME:  Prior to Admission medications   Medication Sig Start Date End Date Taking? Authorizing Provider  aspirin EC 81 MG tablet Take 81 mg by mouth daily.   Yes [provider]  beclomethasone (QVAR) 80 MCG/ACT inhaler Inhale 2 puffs into the lungs 2 (two) times daily.   Yes [provider]  carvedilol (COREG) 12.5 MG tablet Take 1 tablet (12.5 mg total) by mouth 2 (two) times daily with a meal. 06/09/13  Yes Rodolph Bong, MD  cephALEXin (KEFLEX) 500 MG capsule Take 1 capsule (500 mg total) by mouth 2 (two) times daily. 03/10/17  Yes Loleta Rose, MD  cetirizine (ZYRTEC) 10 MG tablet Take 10 mg by mouth daily.   Yes [provider]  citalopram (CELEXA) 10 MG  tablet Take 30 mg by mouth daily.    Yes [provider]  fluticasone (FLONASE) 50 MCG/ACT nasal spray Place 2 sprays into both nostrils daily.   Yes [provider]  furosemide (LASIX) 20 MG tablet Take 20 mg by mouth daily.    Yes [provider]  ipratropium-albuterol (DUONEB) 0.5-2.5 (3) MG/3ML SOLN Take 3 mLs by nebulization 3 (three) times daily. 02/27/17  Yes Auburn Bilberry, MD  losartan (COZAAR) 100 MG tablet Take 1 tablet (100 mg total) by mouth daily. Patient taking differently: Take 50 mg by mouth daily.  02/27/17 02/27/18 Yes Auburn Bilberry, MD  montelukast (SINGULAIR) 10 MG tablet Take 10 mg by mouth at bedtime.   Yes [provider]  moxifloxacin (VIGAMOX) 0.5 % ophthalmic solution Place 1 drop into the left eye 3 (three) times daily.   Yes [provider]   nystatin cream (MYCOSTATIN) Apply 1 application topically 2 (two) times daily.   Yes [provider]  omeprazole (PRILOSEC) 20 MG capsule Take 20 mg by mouth daily before breakfast.    Yes [provider]  senna-docusate (SENOKOT-S) 8.6-50 MG tablet Take 1 tablet by mouth 2 (two) times daily.    Yes [provider]  simvastatin (ZOCOR) 20 MG tablet Take 20 mg by mouth daily.    Yes [provider]  vitamin B-12 (CYANOCOBALAMIN) 1000 MCG tablet Take 1,000 mcg by mouth daily.   Yes [provider]  acetaminophen (TYLENOL) 325 MG tablet Take 650 mg by mouth every 4 (four) hours as needed for fever.    [provider]  guaiFENesin (ROBITUSSIN) 100 MG/5ML SOLN Take 5 mLs (100 mg total) by mouth every 4 (four) hours as needed for cough or to loosen phlegm. 02/27/17   Auburn Bilberry, MD  loperamide (IMODIUM A-D) 2 MG tablet Take 4 mg by mouth as needed for diarrhea or loose stools. Do not exceed 8 doses in 24 hours.    [provider]  magnesium hydroxide (MILK OF MAGNESIA) 400 MG/5ML suspension Take 30 mLs by mouth daily as needed for mild constipation.    [provider]      PHYSICAL EXAMINATION:   VITAL SIGNS: Blood pressure (!) 191/96, pulse 80, temperature 98.1 F (36.7 C), temperature source Oral, resp. rate 20, height 5' (1.524 m), weight 88.5 kg (195 lb), SpO2 96 %.  GENERAL:  83 y.o.-year-old patient lying in the bed with no acute distress.  EYES: Pupils equal, round, reactive to light and accommodation. No scleral icterus. Extraocular muscles intact.  HEENT: Head atraumatic, normocephalic. Oropharynx and nasopharynx clear.  NECK:  Supple, no jugular venous distention. No thyroid enlargement, no tenderness.  LUNGS: Normal breath sounds bilaterally, no wheezing, rales,rhonchi or crepitation. No use of accessory muscles of respiration.  CARDIOVASCULAR: S1, S2 normal. No murmurs, rubs, or gallops.  ABDOMEN: Soft,  nontender, nondistended. Bowel sounds present. No organomegaly or mass.  EXTREMITIES: No pedal edema, cyanosis, or clubbing.  NEUROLOGIC: Cranial nerves II through XII are intact. Muscle strength 2-3/5 in all extremities. Sensation intact. Gait not checked.  PSYCHIATRIC: The patient is alert and oriented x 1.  SKIN: No obvious rash, lesion, or ulcer.   LABORATORY PANEL:   CBC  Recent Labs Lab 03/10/17 0022 03/12/17 1342  WBC 17.0* 10.3  HGB 11.5* 10.6*  HCT 35.4 32.7*  PLT 188 177  MCV 86.9 86.7  MCH 28.3 28.2  MCHC 32.5 32.5  RDW 15.5* 15.3*   ------------------------------------------------------------------------------------------------------------------  Chemistries   Recent Labs  Lab 03/10/17 0022 03/12/17 1342  NA 133* 136  K 4.1 4.1  CL 99* 102  CO2 24 25  GLUCOSE 132* 118*  BUN 26* 20  CREATININE 1.22* 1.06*  CALCIUM 8.0* 8.3*  AST  --  12*  ALT  --  9*  ALKPHOS  --  46  BILITOT  --  0.8   ------------------------------------------------------------------------------------------------------------------ estimated creatinine clearance is 32.8 mL/min (A) (by C-G formula based on SCr of 1.06 mg/dL (H)). ------------------------------------------------------------------------------------------------------------------ No results for input(s): TSH, T4TOTAL, T3FREE, THYROIDAB in the last 72 hours.  Invalid input(s): FREET3   Coagulation profile No results for input(s): INR, PROTIME in the last 168 hours. ------------------------------------------------------------------------------------------------------------------- No results for input(s): DDIMER in the last 72 hours. -------------------------------------------------------------------------------------------------------------------  Cardiac Enzymes No results for input(s): CKMB, TROPONINI, MYOGLOBIN in the last 168 hours.  Invalid input(s):  CK ------------------------------------------------------------------------------------------------------------------ Invalid input(s): POCBNP  ---------------------------------------------------------------------------------------------------------------  Urinalysis    Component Value Date/Time   COLORURINE YELLOW (A) 03/12/2017 1525   APPEARANCEUR CLOUDY (A) 03/12/2017 1525   APPEARANCEUR Clear 02/22/2015 1630   LABSPEC 1.004 (L) 03/12/2017 1525   LABSPEC 1.005 02/08/2014 1444   PHURINE 6.0 03/12/2017 1525   GLUCOSEU NEGATIVE 03/12/2017 1525   GLUCOSEU Negative 02/08/2014 1444   HGBUR SMALL (A) 03/12/2017 1525   BILIRUBINUR NEGATIVE 03/12/2017 1525   BILIRUBINUR Negative 02/22/2015 1630   BILIRUBINUR Negative 02/08/2014 1444   KETONESUR NEGATIVE 03/12/2017 1525   PROTEINUR NEGATIVE 03/12/2017 1525   UROBILINOGEN 0.2 06/04/2013 2110   NITRITE NEGATIVE 03/12/2017 1525   LEUKOCYTESUR LARGE (A) 03/12/2017 1525   LEUKOCYTESUR 1+ (A) 02/22/2015 1630   LEUKOCYTESUR 3+ 02/08/2014 1444     RADIOLOGY: No results found.  EKG: Orders placed or performed during the hospital encounter of 03/09/17  . EKG 12-Lead  . EKG 12-Lead  . ED EKG  . ED EKG    IMPRESSION AND PLAN:  * UTI   E fecalis   Amoxicilline  * Diarrhea   IV fluids,    Check GI panel and C diff  * Hypertension   Cont home meds add amlodipine  * hyperlipidemia   Cont simvastatin.  * Dementia   Baseline/  All the records are reviewed and case discussed with ED provider. Management plans discussed with the patient, family and they are in agreement.  CODE STATUS: DNR Code Status History    Date Active Date Inactive Code Status Order ID Comments User Context   03/10/2017 12:58 AM 03/10/2017  7:00 AM DNR [id number]  Loleta Rose, MD ED   02/23/2017  3:42 PM 02/27/2017  5:45 PM DNR [id number]  Shaune Pollack, MD Inpatient   11/11/2015 10:43 PM 11/12/2015  9:00 PM DNR [id number]  Gery Pray, MD Inpatient    09/30/2015  5:46 PM 10/01/2015  4:34 PM Full Code [id number]  Juanell Fairly, MD ED   06/05/2013  1:20 AM 06/09/2013  9:41 PM Full Code [id number]  Alison Murray, MD Inpatient    Questions for Most Recent Historical Code Status (Order [id number])    Question Answer Comment   In the event of cardiac or respiratory ARREST Do not call a "code blue"    In the event of cardiac or respiratory ARREST Do not perform Intubation, CPR, defibrillation or ACLS    In the event of cardiac or respiratory ARREST Use medication by any route, position, wound care, and other measures to relive pain and suffering. May use oxygen, suction and manual treatment of airway obstruction as needed for comfort.  Advance Directive Documentation     Most Recent Value  Type of Advance Directive  Out of facility DNR (pink MOST or yellow form)  Pre-existing out of facility DNR order (yellow form or pink MOST form)  -  "MOST" Form in Place?  -       TOTAL TIME TAKING CARE OF THIS PATIENT: 45 minutes.  Son present in room during my visit.  Altamese DillingVACHHANI, Alessandro Griep M.D on 03/12/2017   Between 7am to 6pm - Pager - [phone]  After 6pm go to [email or web address] - password [email or web address]  [facility]  Office  [phone]  CC: Primary care physician; Kandyce RudBabaoff, Marcus, MD   Note: This dictation was prepared with Dragon dictation along with smaller phrase technology. Any transcriptional errors that result from this process are unintentional.

## 2017-03-12 NOTE — ED Triage Notes (Signed)
Patient from [facility] via [facility]. Staff reports patient has had multiple episodes of mucous consistency diarrhea since last night. Patient was recently started on antibiotics to treat a UTI. Patient oriented at baseline per family. History of dementia. Patient also has had productive cough for approximately one week. Patient afebrile.

## 2017-03-12 NOTE — ED Notes (Signed)
Patient given coffee, prune juice, and sandwich tray per MD. Family at bedside to assist patient to eat. Informed family and patient to notify this RN if patient had a bowel movement.

## 2017-03-12 NOTE — ED Provider Notes (Signed)
[facility] Emergency Department Provider Note  ____________________________________________   First MD Initiated Contact with Patient 03/12/17 1358     (approximate)  I have reviewed the triage vital signs and the nursing notes.   HISTORY  Chief Complaint Diarrhea and Cough  Level 5 caveat:  history/ROS limited by chronic dementia  HPI Daisy [name] is a 83 y.o. female with extensive past medical history as listed below who presents by EMS for evaluation of acute onset severe diarrhea over the last few days.  I saw the patient 3 days ago for generalized weakness and started her on Keflex for a urinary tract infection.    her daughters are present in the room and reported that she began having copious and extremely foul-smelling watery and mucousy bowel movements over the last couple of days.  The staff at her assisted living facility is concerned that it may be C. difficile.  Continues to have generalized weakness and decreased oral intake but these symptoms are unchanged from the last ED visit.  She has been afebrile to the best of their knowledge.  She is not having any trouble breathing but continues to have a persistent cough; she was hospitalized with bronchitis a few weeks prior to her last ED visit.  Nothing in particular makes her diarrhea better or worse and it is severe.   Past Medical History:  Diagnosis Date  . Acute renal failure (HCC)   . Anemia   . Asthma   . Cerebral artery occlusion    with cerebral infarction  . Dementia   . Depression 06/05/2013  . Edema   . Essential hypertension, benign 06/05/2013  . GERD (gastroesophageal reflux disease) 06/05/2013  . Heart attack (HCC)   . Osteoarthritis   . Other and unspecified hyperlipidemia 06/05/2013  . Paronychia, toe   . Recurrent UTI   . Stroke [facility])     Patient Active Problem List   Diagnosis Date Noted  . Diarrhea 03/12/2017  . Sepsis (HCC) 02/23/2017  . Rectal bleed 11/11/2015    . Dementia 11/11/2015  . COPD (chronic obstructive pulmonary disease) (HCC) 11/11/2015  . Dyslipidemia 11/11/2015  . Infection of urinary tract 10/04/2015  . Escherichia coli (E. coli) infection 10/04/2015  . CKD (chronic kidney disease), stage III (HCC) 10/04/2015  . Hyperglycemia 10/04/2015  . MRSA carrier 10/04/2015  . Leukocytosis 10/04/2015  . Thrombocytopenia (HCC) 10/04/2015  . Closed right hip fracture (HCC) 09/30/2015  . Recurrent UTI 11/12/2014  . Microscopic hematuria 11/12/2014  . Atrophic vaginitis 11/12/2014  . CVA (cerebral infarction) 06/05/2013  . Hypotension, unspecified 06/05/2013  . Other and unspecified hyperlipidemia 06/05/2013  . Acute renal failure (HCC) 06/05/2013  . Leukocytosis, unspecified 06/05/2013  . Essential hypertension, benign 06/05/2013  . Depression 06/05/2013  . GERD (gastroesophageal reflux disease) 06/05/2013    Past Surgical History:  Procedure Laterality Date  . CARDIAC SURGERY     Left BBB  . FEMUR IM NAIL Right 10/01/2015   Procedure: INTRAMEDULLARY (IM) NAIL FEMORAL;  Surgeon: Juanell Fairly, MD;  Location: [facility] ORS;  Service: Orthopedics;  Laterality: Right;  . KNEE SURGERY Right     Prior to Admission medications   Medication Sig Start Date End Date Taking? Authorizing Provider  aspirin EC 81 MG tablet Take 81 mg by mouth daily.   Yes [provider]  beclomethasone (QVAR) 80 MCG/ACT inhaler Inhale 2 puffs into the lungs 2 (two) times daily.   Yes [provider]  carvedilol (COREG) 12.5 MG  tablet Take 1 tablet (12.5 mg total) by mouth 2 (two) times daily with a meal. 06/09/13  Yes Rodolph Bong, MD  cephALEXin (KEFLEX) 500 MG capsule Take 1 capsule (500 mg total) by mouth 2 (two) times daily. 03/10/17  Yes Loleta Rose, MD  cetirizine (ZYRTEC) 10 MG tablet Take 10 mg by mouth daily.   Yes [provider]  citalopram (CELEXA) 10 MG tablet Take 30 mg by mouth daily.    Yes [provider]   fluticasone (FLONASE) 50 MCG/ACT nasal spray Place 2 sprays into both nostrils daily.   Yes [provider]  furosemide (LASIX) 20 MG tablet Take 20 mg by mouth daily.    Yes [provider]  ipratropium-albuterol (DUONEB) 0.5-2.5 (3) MG/3ML SOLN Take 3 mLs by nebulization 3 (three) times daily. 02/27/17  Yes Auburn Bilberry, MD  losartan (COZAAR) 100 MG tablet Take 1 tablet (100 mg total) by mouth daily. Patient taking differently: Take 50 mg by mouth daily.  02/27/17 02/27/18 Yes Auburn Bilberry, MD  montelukast (SINGULAIR) 10 MG tablet Take 10 mg by mouth at bedtime.   Yes [provider]  moxifloxacin (VIGAMOX) 0.5 % ophthalmic solution Place 1 drop into the left eye 3 (three) times daily.   Yes [provider]  nystatin cream (MYCOSTATIN) Apply 1 application topically 2 (two) times daily.   Yes [provider]  omeprazole (PRILOSEC) 20 MG capsule Take 20 mg by mouth daily before breakfast.    Yes [provider]  senna-docusate (SENOKOT-S) 8.6-50 MG tablet Take 1 tablet by mouth 2 (two) times daily.    Yes [provider]  simvastatin (ZOCOR) 20 MG tablet Take 20 mg by mouth daily.    Yes [provider]  vitamin B-12 (CYANOCOBALAMIN) 1000 MCG tablet Take 1,000 mcg by mouth daily.   Yes [provider]  acetaminophen (TYLENOL) 325 MG tablet Take 650 mg by mouth every 4 (four) hours as needed for fever.    [provider]  guaiFENesin (ROBITUSSIN) 100 MG/5ML SOLN Take 5 mLs (100 mg total) by mouth every 4 (four) hours as needed for cough or to loosen phlegm. 02/27/17   Auburn Bilberry, MD  loperamide (IMODIUM A-D) 2 MG tablet Take 4 mg by mouth as needed for diarrhea or loose stools. Do not exceed 8 doses in 24 hours.    [provider]  magnesium hydroxide (MILK OF MAGNESIA) 400 MG/5ML suspension Take 30 mLs by mouth daily as needed for mild constipation.    [provider]     Allergies Aricept [donepezil hcl]; Codeine; and Ketek [telithromycin]  Family History  Problem Relation Age of Onset  . Prostate cancer Son   . Cancer Mother        Oral  . Colon cancer Sister   . Kidney disease Neg Hx   . Bladder Cancer Neg Hx     Social History Social History  Substance Use Topics  . Smoking status: Former Games developer  . Smokeless tobacco: Never Used     Comment: quit 50 + years  . Alcohol use 0.0 oz/week     Comment: occasionally    Review of Systems Level 5 caveat:  history/ROS limited by chronic dementia, but she denies being in any acute pain and denies having shortness of breath ____________________________________________   PHYSICAL EXAM:  VITAL SIGNS: ED Triage Vitals [03/12/17 1322]  Enc Vitals Group     BP (!) 159/99     Pulse Rate 80  Resp (!) 24     Temp 98.1 F (36.7 C)     Temp Source Oral     SpO2 95 %     Weight 88.5 kg (195 lb)     Height 1.524 m (5')     Head Circumference      Peak Flow      Pain Score      Pain Loc      Pain Edu?      Excl. in GC?     Constitutional: Alert and responds to and participates in conversation.  Denies any pain and is in no acute distress Eyes: Conjunctivae are normal.  Head: Atraumatic. Cardiovascular: Normal rate, regular rhythm. Good peripheral circulation. Grossly normal heart sounds. Respiratory: Normal respiratory effort.  No retractions. Lungs CTAB. frequent thick sounding cough Gastrointestinal: Soft and nontender. No distention.  Musculoskeletal: No lower extremity tenderness nor edema. No gross deformities of extremities. Neurologic:  Normal speech and language. No gross focal neurologic deficits are appreciated.  Skin:  Skin is warm, dry and intact. No rash noted.   ____________________________________________   LABS (all labs ordered are listed, but only abnormal results are displayed)  Labs Reviewed  COMPREHENSIVE METABOLIC PANEL - Abnormal; Notable for the following:        Result Value   Glucose, Bld 118 (*)    Creatinine, Ser 1.06 (*)    Calcium 8.3 (*)    Albumin 2.9 (*)    AST 12 (*)    ALT 9 (*)    GFR calc non Af Amer 44 (*)    GFR calc Af Amer 51 (*)    All other components within normal limits  CBC - Abnormal; Notable for the following:    RBC 3.77 (*)    Hemoglobin 10.6 (*)    HCT 32.7 (*)    RDW 15.3 (*)    All other components within normal limits  URINALYSIS, COMPLETE (UACMP) WITH MICROSCOPIC - Abnormal; Notable for the following:    Color, Urine YELLOW (*)    APPearance CLOUDY (*)    Specific Gravity, Urine 1.004 (*)    Hgb urine dipstick SMALL (*)    Leukocytes, UA LARGE (*)    Squamous Epithelial / LPF 0-5 (*)    All other components within normal limits  GASTROINTESTINAL PANEL BY PCR, STOOL (REPLACES STOOL CULTURE)  C DIFFICILE QUICK SCREEN W PCR REFLEX  LIPASE, BLOOD   ____________________________________________  EKG  None - EKG not ordered by ED physician ____________________________________________  RADIOLOGY   No results found.  ____________________________________________   PROCEDURES  Critical Care performed: No   Procedure(s) performed:   Procedures   ____________________________________________   INITIAL IMPRESSION / ASSESSMENT AND PLAN / ED COURSE  As part of my medical decision making, I reviewed the following data within the electronic MEDICAL RECORD NUMBER History obtained from family, Nursing notes reviewed and incorporated, Labs reviewed , Old chart reviewed and Notes from prior ED visits    the patient seems to be at her baseline and she does not seem clinically different than when I saw her 3 days ago.  However the foul-smelling diarrhea is certainly a new complaint and C. difficile would not be unexpected after starting the antibiotics.  Given that she lives at an assisting living facility and requires a great deal of assistance from her 7 adult children that all of nearby, I think it is  appropriate to try and get a stool sample today to rule out infectious diarrhea  including C. difficile.  I have asked the family and nursing staff to provide whatever the patient needs to eat or drink to encourage a bowel movement.  There is no indication for any other workup at this time.  Her urinalysis today continues to show too numerous to count white blood cells. I looked at the urine culture from 3 days ago and the pathogenic species is enterococcus faecalis, which is uniformly resistant to cephalosporins, and I prescribed Keflex.  I called and spoke by phone with Dr. Sampson GoonFitzgerald with infectious disease.  He recommended amoxicillin 500 mg by mouth twice daily for 10 days if she can tolerate oral medication, or ampicillin if she requires IV medication.  Before starting her on a new antibiotic, we will need to evaluate the possibility of infectious diarrhea.  I update the family.  Clinical Course as of Mar 12 2109  Tue Mar 12, 2017  2109 we observed the patient in the emergency department for 6-1/2 hours.  In spite of eating and drinking and moving around, she was not able to provide a stool sample.  I had multiple extensive discussions with the patient's family, and we decided to observe her in the hospital because of her generalized weakness, her persistent UTI that was inadequately treated, and her diarrhea which may be the result of C. difficile.  My concern is that if we discharge her back to her facility, they we will send her back immediately as soon as she has a bowel movement and we have not yet disproved the C. difficile diagnosis. family agrees with the plan.  I discussed the case with the hospitalist who will admit.  [CF]    Clinical Course User Index [CF] Loleta RoseForbach, Ronetta Molla, MD    ____________________________________________  FINAL CLINICAL IMPRESSION(S) / ED DIAGNOSES  Final diagnoses:  Urinary tract infection without hematuria, site unspecified  Diarrhea, unspecified type   Generalized weakness  Chronic cough  Failure to thrive in adult     MEDICATIONS GIVEN DURING THIS VISIT:  Medications  amoxicillin (AMOXIL) capsule 500 mg (500 mg Oral Given 03/12/17 1933)  amLODipine (NORVASC) tablet 5 mg (5 mg Oral Given 03/12/17 2048)  0.9 %  sodium chloride infusion (not administered)     NEW OUTPATIENT MEDICATIONS STARTED DURING THIS VISIT:  New Prescriptions   No medications on file    Modified Medications   No medications on file    Discontinued Medications   No medications on file     Note:  This document was prepared using Dragon voice recognition software and may include unintentional dictation errors.    Loleta RoseForbach, Wesleigh Markovic, MD 03/12/17 2110

## 2017-03-12 NOTE — ED Notes (Signed)
Laura RN, aware of bed assigned  

## 2017-03-12 NOTE — ED Notes (Signed)
In and out cath preformed by this RN and Rosey Batheresa, Charity fundraiserN. Patient's brief and bed linen changed. No bowel movement noted. Patient repositioned in bed and urine specimen sent to lab.

## 2017-03-13 DIAGNOSIS — N390 Urinary tract infection, site not specified: Secondary | ICD-10-CM | POA: Diagnosis not present

## 2017-03-13 DIAGNOSIS — F0390 Unspecified dementia without behavioral disturbance: Secondary | ICD-10-CM | POA: Diagnosis not present

## 2017-03-13 DIAGNOSIS — I10 Essential (primary) hypertension: Secondary | ICD-10-CM | POA: Diagnosis not present

## 2017-03-13 DIAGNOSIS — R197 Diarrhea, unspecified: Secondary | ICD-10-CM | POA: Diagnosis not present

## 2017-03-13 LAB — BASIC METABOLIC PANEL
Anion gap: 7 (ref 5–15)
BUN: 14 mg/dL (ref 6–20)
CHLORIDE: 105 mmol/L (ref 101–111)
CO2: 28 mmol/L (ref 22–32)
CREATININE: 0.98 mg/dL (ref 0.44–1.00)
Calcium: 8.4 mg/dL — ABNORMAL LOW (ref 8.9–10.3)
GFR calc Af Amer: 56 mL/min — ABNORMAL LOW (ref >60)
GFR calc non Af Amer: 48 mL/min — ABNORMAL LOW (ref >60)
Glucose, Bld: 125 mg/dL — ABNORMAL HIGH (ref 65–99)
POTASSIUM: 4.2 mmol/L (ref 3.5–5.1)
Sodium: 140 mmol/L (ref 135–145)

## 2017-03-13 LAB — CBC
HEMATOCRIT: 34.5 % — AB (ref 35.0–47.0)
Hemoglobin: 11.1 g/dL — ABNORMAL LOW (ref 12.0–16.0)
MCH: 27.9 pg (ref 26.0–34.0)
MCHC: 32.1 g/dL (ref 32.0–36.0)
MCV: 86.9 fL (ref 80.0–100.0)
PLATELETS: 182 10*3/uL (ref 150–440)
RBC: 3.98 MIL/uL (ref 3.80–5.20)
RDW: 15.4 % — AB (ref 11.5–14.5)
WBC: 8.2 10*3/uL (ref 3.6–11.0)

## 2017-03-13 MED ORDER — AMOXICILLIN-POT CLAVULANATE 875-125 MG PO TABS: 1.0000 | ORAL_TABLET | Freq: Two times a day (BID) | ORAL | 0 refills | Status: AC

## 2017-03-13 MED ORDER — IPRATROPIUM-ALBUTEROL 0.5-2.5 (3) MG/3ML IN SOLN
3.0000 mL | Freq: Three times a day (TID) | RESPIRATORY_TRACT | Status: DC
  Administered 2017-03-13: 08:00:00 3 mL via RESPIRATORY_TRACT
  Filled 2017-03-13: qty 3

## 2017-03-13 NOTE — Progress Notes (Signed)
Patient is to be discharged to the [facility] today. Patient is in no acute distress at this time, and assessment is unchanged from this morning. Patient's IV is out, discharge paperwork has been discussed with family and discharge packet given to the pt's daughter to transport and deliver to facility. Hospital doctorAmber (the resident care coordinator) has been given report and there are no questions or concerns at this time. Patient will be accompanied downstairs by staff and family via wheelchair.

## 2017-03-13 NOTE — Discharge Summary (Signed)
[facility] -  at [facility]  Inge T Scroggs, 83 y.o., DOB 12-24-1921, MRN [id number]. Admission date: 03/12/2017 Discharge Date 03/13/2017 Primary MD Kandyce RudBabaoff, Marcus, MD Admitting Physician Altamese DillingVaibhavkumar Vachhani, MD  Admission Diagnosis  Chronic cough [R05] Failure to thrive in adult [R62.7] Generalized weakness [R53.1] Urinary tract infection without hematuria, site unspecified [N39.0] Diarrhea, unspecified type [R19.7]  Discharge Diagnosis   Principal Problem:   Diarrhea resolved  Enterococcus faecalis UTI  Previous history of CVA GERD Depression Dementia          Hospital Course patient 83 year old who was sent from her rehabilitation facility due to diarrhea. Patient has been on Keflex. Patient was seen in the emergency room and admitted for possible C. difficile however her diarrhea stopped and we were unable to collect any stool sample. She was noticed to have a urinary tract infection with Enterococcus faecalis so she was treated with Augmentin. Patient currently is back to baseline and stable to return back to skilled nursing facility.            Consults  None  Significant Tests:  See full reports for all details     Dg Chest 2 View  Result Date: 02/23/2017 CLINICAL DATA:  Emesis, congestion an UGI symptoms. EXAM: CHEST  2 VIEW COMPARISON:  Chest x-rays dated 01/16/2017 and 09/30/2015. FINDINGS: Cardiomegaly is stable. Atherosclerotic changes again noted at the aortic arch. Prominent pulmonary arteries again noted suggesting chronic pulmonary artery hypertension. Lungs are clear. No pleural effusion or pneumothorax. Probable bibasilar bronchiectasis. No acute or suspicious osseous finding. IMPRESSION: 1. No active cardiopulmonary disease. No evidence of pneumonia or pulmonary edema. 2.  Aortic atherosclerosis. 3.  Stable cardiomegaly. 4.  Probable bibasilar bronchiectasis. 5.  Probable chronic pulmonary artery hypertension. Electronically  Signed   By: Bary RichardStan  Maynard M.D.   On: 02/23/2017 11:33   Dg Chest Portable 1 View  Result Date: 03/10/2017 CLINICAL DATA:  Productive cough, recent diagnosis of bronchitis. EXAM: PORTABLE CHEST 1 VIEW COMPARISON:  02/26/2017 FINDINGS: Cardiomegaly with aortic atherosclerosis. No acute pneumonic consolidation, effusion or pneumothorax. Degenerative changes are seen about both glenohumeral and AC joints. No acute nor suspicious osseous abnormality. IMPRESSION: Stable cardiomegaly with aortic atherosclerosis.  No active disease. Electronically Signed   By: Tollie Ethavid  Kwon M.D.   On: 03/10/2017 01:28   Dg Chest Port 1 View  Result Date: 02/26/2017 CLINICAL DATA:  Shortness of breath.  Cough. EXAM: PORTABLE CHEST 1 VIEW COMPARISON:  02/23/2017 FINDINGS: Chronic cardiomegaly. Tortuosity and calcification of the thoracic aorta. Pulmonary vascularity now appears normal. Lungs are clear. No acute bone abnormality. IMPRESSION: No acute abnormalities. Aortic atherosclerosis. Chronic cardiomegaly. Lungs are clear. Electronically Signed   By: Francene BoyersJames  Maxwell M.D.   On: 02/26/2017 11:53       Today   Subjective:   Rolene ArbourMolene Rogoff patient denies any symptoms has generalized weakness  Objective:   Blood pressure (!) 146/75, pulse 93, temperature 97.6 F (36.4 C), temperature source Axillary, resp. rate 15, height 5' (1.524 m), weight 176 lb 8 oz (80.1 kg), SpO2 95 %.  .  Intake/Output Summary (Last 24 hours) at 03/13/17 1202 Last data filed at 03/13/17 0911  Gross per 24 hour  Intake           833.84 ml  Output             1050 ml  Net          -216.16 ml    Exam VITAL SIGNS: Blood pressure Marland Kitchen(!)  146/75, pulse 93, temperature 97.6 F (36.4 C), temperature source Axillary, resp. rate 15, height 5' (1.524 m), weight 176 lb 8 oz (80.1 kg), SpO2 95 %.  GENERAL:  83 y.o.-year-old patient lying in the bed with no acute distress.  EYES: Pupils equal, round, reactive to light and accommodation. No scleral  icterus. Extraocular muscles intact.  HEENT: Head atraumatic, normocephalic. Oropharynx and nasopharynx clear.  NECK:  Supple, no jugular venous distention. No thyroid enlargement, no tenderness.  LUNGS: Normal breath sounds bilaterally, no wheezing, rales,rhonchi or crepitation. No use of accessory muscles of respiration.  CARDIOVASCULAR: S1, S2 normal. No murmurs, rubs, or gallops.  ABDOMEN: Soft, nontender, nondistended. Bowel sounds present. No organomegaly or mass.  EXTREMITIES: No pedal edema, cyanosis, or clubbing.  NEUROLOGIC: Cranial nerves II through XII are intact. Muscle strength 5/5 in all extremities. Sensation intact. Gait not checked.  PSYCHIATRIC: The patient is alert and oriented x 3.  SKIN: No obvious rash, lesion, or ulcer.   Data Review     CBC w Diff: Lab Results  Component Value Date   WBC 8.2 03/13/2017   HGB 11.1 (L) 03/13/2017   HGB 12.0 02/09/2014   HCT 34.5 (L) 03/13/2017   HCT 38.8 02/09/2014   PLT 182 03/13/2017   PLT 215 02/09/2014   LYMPHOPCT 8 02/23/2017   LYMPHOPCT 3.4 02/09/2014   MONOPCT 9 02/23/2017   MONOPCT 3.0 02/09/2014   EOSPCT 1 02/23/2017   EOSPCT 0.1 02/09/2014   BASOPCT 1 02/23/2017   BASOPCT 0.0 02/09/2014   CMP: Lab Results  Component Value Date   NA 140 03/13/2017   NA 139 02/11/2014   K 4.2 03/13/2017   K 4.1 02/11/2014   CL 105 03/13/2017   CL 101 02/11/2014   CO2 28 03/13/2017   CO2 35 (H) 02/11/2014   BUN 14 03/13/2017   BUN 25 (H) 02/11/2014   CREATININE 0.98 03/13/2017   CREATININE 1.55 (H) 02/11/2014   PROT 6.6 03/12/2017   PROT 6.7 09/11/2013   ALBUMIN 2.9 (L) 03/12/2017   ALBUMIN 3.0 (L) 09/11/2013   BILITOT 0.8 03/12/2017   BILITOT 0.4 09/11/2013   ALKPHOS 46 03/12/2017   ALKPHOS 63 09/11/2013   AST 12 (L) 03/12/2017   AST 10 (L) 09/11/2013   ALT 9 (L) 03/12/2017   ALT 11 (L) 09/11/2013  .  Micro Results Recent Results (from the past 240 hour(s))  Urine Culture     Status: Abnormal   Collection  Time: 03/10/17  1:35 AM  Result Value Ref Range Status   Specimen Description URINE, CATHETERIZED  Final   Special Requests Normal  Final   Culture >=100,000 COLONIES/mL ENTEROCOCCUS FAECALIS (A)  Final   Report Status 03/12/2017 FINAL  Final   Organism ID, Bacteria ENTEROCOCCUS FAECALIS (A)  Final      Susceptibility   Enterococcus faecalis - MIC*    AMPICILLIN <=2 SENSITIVE Sensitive     LEVOFLOXACIN >=8 RESISTANT Resistant     NITROFURANTOIN <=16 SENSITIVE Sensitive     VANCOMYCIN 1 SENSITIVE Sensitive     * >=100,000 COLONIES/mL ENTEROCOCCUS FAECALIS        Code Status Orders        Start     Ordered   03/12/17 2113  Do not attempt resuscitation (DNR)  Continuous    Question Answer Comment  In the event of cardiac or respiratory ARREST Do not call a "code blue"   In the event of cardiac or respiratory ARREST Do not perform Intubation,  CPR, defibrillation or ACLS   In the event of cardiac or respiratory ARREST Use medication by any route, position, wound care, and other measures to relive pain and suffering. May use oxygen, suction and manual treatment of airway obstruction as needed for comfort.      03/12/17 2112    Code Status History    Date Active Date Inactive Code Status Order ID Comments User Context   03/10/2017 12:58 AM 03/10/2017  7:00 AM DNR [id number]  Loleta Rose, MD ED   02/23/2017  3:42 PM 02/27/2017  5:45 PM DNR [id number]  Shaune Pollack, MD Inpatient   11/11/2015 10:43 PM 11/12/2015  9:00 PM DNR [id number]  Gery Pray, MD Inpatient   09/30/2015  5:46 PM 10/01/2015  4:34 PM Full Code [id number]  Juanell Fairly, MD ED   06/05/2013  1:20 AM 06/09/2013  9:41 PM Full Code [id number]  Alison Murray, MD Inpatient    Advance Directive Documentation     Most Recent Value  Type of Advance Directive  Out of facility DNR (pink MOST or yellow form)  Pre-existing out of facility DNR order (yellow form or pink MOST form)  -  "MOST" Form in Place?  -             Discharge Medications   Allergies as of 03/13/2017      Reactions   Aricept [donepezil Hcl] Other (See Comments)   Reaction:  Nightmares    Codeine Other (See Comments)   Reaction:  Unknown    Ketek [telithromycin] Other (See Comments)   Reaction:  Mood changes       Medication List    STOP taking these medications   cephALEXin 500 MG capsule Commonly known as:  KEFLEX   senna-docusate 8.6-50 MG tablet Commonly known as:  Senokot-S     TAKE these medications   acetaminophen 325 MG tablet Commonly known as:  TYLENOL Take 650 mg by mouth every 4 (four) hours as needed for fever.   amoxicillin-clavulanate 875-125 MG tablet Commonly known as:  AUGMENTIN Take 1 tablet by mouth every 12 (twelve) hours.   aspirin EC 81 MG tablet Take 81 mg by mouth daily.   beclomethasone 80 MCG/ACT inhaler Commonly known as:  QVAR Inhale 2 puffs into the lungs 2 (two) times daily.   carvedilol 12.5 MG tablet Commonly known as:  COREG Take 1 tablet (12.5 mg total) by mouth 2 (two) times daily with a meal.   cetirizine 10 MG tablet Commonly known as:  ZYRTEC Take 10 mg by mouth daily.   citalopram 10 MG tablet Commonly known as:  CELEXA Take 30 mg by mouth daily.   fluticasone 50 MCG/ACT nasal spray Commonly known as:  FLONASE Place 2 sprays into both nostrils daily.   furosemide 20 MG tablet Commonly known as:  LASIX Take 20 mg by mouth daily.   guaiFENesin 100 MG/5ML Soln Commonly known as:  ROBITUSSIN Take 5 mLs (100 mg total) by mouth every 4 (four) hours as needed for cough or to loosen phlegm.   ipratropium-albuterol 0.5-2.5 (3) MG/3ML Soln Commonly known as:  DUONEB Take 3 mLs by nebulization 3 (three) times daily.   loperamide 2 MG tablet Commonly known as:  IMODIUM A-D Take 4 mg by mouth as needed for diarrhea or loose stools. Do not exceed 8 doses in 24 hours.   losartan 100 MG tablet Commonly known as:  COZAAR Take 1 tablet (100 mg total) by  mouth daily. What changed:  how much  to take   magnesium hydroxide 400 MG/5ML suspension Commonly known as:  MILK OF MAGNESIA Take 30 mLs by mouth daily as needed for mild constipation.   montelukast 10 MG tablet Commonly known as:  SINGULAIR Take 10 mg by mouth at bedtime.   moxifloxacin 0.5 % ophthalmic solution Commonly known as:  VIGAMOX Place 1 drop into the left eye 3 (three) times daily.   nystatin cream Commonly known as:  MYCOSTATIN Apply 1 application topically 2 (two) times daily.   omeprazole 20 MG capsule Commonly known as:  PRILOSEC Take 20 mg by mouth daily before breakfast.   simvastatin 20 MG tablet Commonly known as:  ZOCOR Take 20 mg by mouth daily.   vitamin B-12 1000 MCG tablet Commonly known as:  CYANOCOBALAMIN Take 1,000 mcg by mouth daily.          Total Time in preparing paper work, data evaluation and todays exam - 35 minutes  Auburn Bilberry M.D on 03/13/2017 at 12:02 [facility]  [facility]   Office  [phone]

## 2017-03-13 NOTE — NC FL2 (Signed)
[place] MEDICAID FL2 LEVEL OF CARE SCREENING TOOL     IDENTIFICATION  Patient Name: Daisy [name] Birthdate: 23-Nov-1921 Sex: female Admission Date (Current Location): 03/12/2017  [place] and [place] Number:  Chi[facility]odist and Address:  [facility], [address], [place], [place] [address]      Provider Number: [id number]  Attending Physician Name and Address:  Auburn Bilberry, MD  Relative Name and Phone Number:       Current Level of Care: Hospital Recommended Level of Care: Assisted Living Facility Prior Approval Number:    Date Approved/Denied:   PASRR Number:   Discharge Plan: Domiciliary (Rest home)    Current Diagnoses: Patient Active Problem List   Diagnosis Date Noted  . Diarrhea 03/12/2017  . Sepsis (HCC) 02/23/2017  . Rectal bleed 11/11/2015  . Dementia 11/11/2015  . COPD (chronic obstructive pulmonary disease) (HCC) 11/11/2015  . Dyslipidemia 11/11/2015  . Infection of urinary tract 10/04/2015  . Escherichia coli (E. coli) infection 10/04/2015  . CKD (chronic kidney disease), stage III (HCC) 10/04/2015  . Hyperglycemia 10/04/2015  . MRSA carrier 10/04/2015  . Leukocytosis 10/04/2015  . Thrombocytopenia (HCC) 10/04/2015  . Closed right hip fracture (HCC) 09/30/2015  . Recurrent UTI 11/12/2014  . Microscopic hematuria 11/12/2014  . Atrophic vaginitis 11/12/2014  . CVA (cerebral infarction) 06/05/2013  . Hypotension, unspecified 06/05/2013  . Other and unspecified hyperlipidemia 06/05/2013  . Acute renal failure (HCC) 06/05/2013  . Leukocytosis, unspecified 06/05/2013  . Essential hypertension, benign 06/05/2013  . Depression 06/05/2013  . GERD (gastroesophageal reflux disease) 06/05/2013    Orientation RESPIRATION BLADDER Height & Weight     Self  Normal Incontinent Weight: 176 lb 8 oz (80.1 kg) Height:  5' (152.4 cm)  BEHAVIORAL SYMPTOMS/MOOD NEUROLOGICAL BOWEL NUTRITION STATUS      Incontinent Diet  (Diet: Heart Healthy )  AMBULATORY STATUS COMMUNICATION OF NEEDS Skin   Limited Assist Verbally Normal                       Personal Care Assistance Level of Assistance  Bathing, Feeding, Dressing Bathing Assistance: Limited assistance Feeding assistance: Independent Dressing Assistance: Limited assistance     Functional Limitations Info  Sight, Hearing, Speech Sight Info: Adequate Hearing Info: Impaired Speech Info: Adequate    SPECIAL CARE FACTORS FREQUENCY                     Contractures      Additional Factors Info  Code Status, Allergies, Isolation Precautions Code Status Info:  (DNR ) Allergies Info:  (Aricept Donepezil Hcl, Codeine, Ketek Telithromycin)     Isolation Precautions Info:  (MRSA Nasal Swab and Enteric precautions. )    Discharge Medications: Please see discharge summary for a list of discharge medications. Medication List     STOP taking these medications   cephALEXin 500 MG capsule Commonly known as:  KEFLEX   senna-docusate 8.6-50 MG tablet Commonly known as:  Senokot-S     TAKE these medications   acetaminophen 325 MG tablet Commonly known as:  TYLENOL Take 650 mg by mouth every 4 (four) hours as needed for fever.   amoxicillin-clavulanate 875-125 MG tablet Commonly known as:  AUGMENTIN Take 1 tablet by mouth every 12 (twelve) hours.   aspirin EC 81 MG tablet Take 81 mg by mouth daily.   beclomethasone 80 MCG/ACT inhaler Commonly known as:  QVAR Inhale 2 puffs into the lungs 2 (two) times daily.  carvedilol 12.5 MG tablet Commonly known as:  COREG Take 1 tablet (12.5 mg total) by mouth 2 (two) times daily with a meal.   cetirizine 10 MG tablet Commonly known as:  ZYRTEC Take 10 mg by mouth daily.   citalopram 10 MG tablet Commonly known as:  CELEXA Take 30 mg by mouth daily.   fluticasone 50 MCG/ACT nasal spray Commonly known as:  FLONASE Place 2 sprays into both nostrils daily.   furosemide  20 MG tablet Commonly known as:  LASIX Take 20 mg by mouth daily.   guaiFENesin 100 MG/5ML Soln Commonly known as:  ROBITUSSIN Take 5 mLs (100 mg total) by mouth every 4 (four) hours as needed for cough or to loosen phlegm.   ipratropium-albuterol 0.5-2.5 (3) MG/3ML Soln Commonly known as:  DUONEB Take 3 mLs by nebulization 3 (three) times daily.   loperamide 2 MG tablet Commonly known as:  IMODIUM A-D Take 4 mg by mouth as needed for diarrhea or loose stools. Do not exceed 8 doses in 24 hours.   losartan 100 MG tablet Commonly known as:  COZAAR Take 1 tablet (100 mg total) by mouth daily. What changed:  how much to take   magnesium hydroxide 400 MG/5ML suspension Commonly known as:  MILK OF MAGNESIA Take 30 mLs by mouth daily as needed for mild constipation.   montelukast 10 MG tablet Commonly known as:  SINGULAIR Take 10 mg by mouth at bedtime.   moxifloxacin 0.5 % ophthalmic solution Commonly known as:  VIGAMOX Place 1 drop into the left eye 3 (three) times daily.   nystatin cream Commonly known as:  MYCOSTATIN Apply 1 application topically 2 (two) times daily.   omeprazole 20 MG capsule Commonly known as:  PRILOSEC Take 20 mg by mouth daily before breakfast.   simvastatin 20 MG tablet Commonly known as:  ZOCOR Take 20 mg by mouth daily.   vitamin B-12 1000 MCG tablet Commonly known as:  CYANOCOBALAMIN Take 1,000 mcg by mouth daily.   Relevant Imaging Results: Relevant Lab Results: Additional Information  (SSN: [id number])  Elinor Kleine, Darleen CrockerBailey M, LCSW

## 2017-03-13 NOTE — NC FL2 (Signed)
[place] MEDICAID FL2 LEVEL OF CARE SCREENING TOOL     IDENTIFICATION  Patient Name: Daisy [name] Birthdate: 09-26-1921 Sex: female Admission Date (Current Location): 03/12/2017  [facility] [place]   Facility and Address:  [facility], [address], [place], [place] [address]      Provider Number: [id number]  Attending Physician Name and Address:  Auburn BilberryPatel, Shreyang, MD  Relative Name and Phone Number:       Current Level of Care: Hospital Recommended Level of Care: Assisted Living Facility Prior Approval Number:    Date Approved/Denied:   PASRR Number:  [id number])  Discharge Plan: Domiciliary (Rest home)    Current Diagnoses: Patient Active Problem List   Diagnosis Date Noted  . Diarrhea 03/12/2017  . Sepsis (HCC) 02/23/2017  . Rectal bleed 11/11/2015  . Dementia 11/11/2015  . COPD (chronic obstructive pulmonary disease) (HCC) 11/11/2015  . Dyslipidemia 11/11/2015  . Infection of urinary tract 10/04/2015  . Escherichia coli (E. coli) infection 10/04/2015  . CKD (chronic kidney disease), stage III (HCC) 10/04/2015  . Hyperglycemia 10/04/2015  . MRSA carrier 10/04/2015  . Leukocytosis 10/04/2015  . Thrombocytopenia (HCC) 10/04/2015  . Closed right hip fracture (HCC) 09/30/2015  . Recurrent UTI 11/12/2014  . Microscopic hematuria 11/12/2014  . Atrophic vaginitis 11/12/2014  . CVA (cerebral infarction) 06/05/2013  . Hypotension, unspecified 06/05/2013  . Other and unspecified hyperlipidemia 06/05/2013  . Acute renal failure (HCC) 06/05/2013  . Leukocytosis, unspecified 06/05/2013  . Essential hypertension, benign 06/05/2013  . Depression 06/05/2013  . GERD (gastroesophageal reflux disease) 06/05/2013    Orientation RESPIRATION BLADDER Height & Weight     Self  Normal Incontinent Weight: 176 lb 8 oz (80.1 kg) Height:  5' (152.4 cm)  BEHAVIORAL SYMPTOMS/MOOD NEUROLOGICAL BOWEL NUTRITION STATUS   Incontinent Diet (Diet: Heart Healthy )  AMBULATORY STATUS COMMUNICATION OF NEEDS Skin   Limited Assist Verbally Normal                       Personal Care Assistance Level of Assistance  Bathing, Feeding, Dressing Bathing Assistance: Limited assistance Feeding assistance: Independent Dressing Assistance: Limited assistance     Functional Limitations Info  Sight, Hearing, Speech Sight Info: Adequate Hearing Info: Impaired Speech Info: Adequate    SPECIAL CARE FACTORS FREQUENCY  PT (By licensed PT)     PT Frequency:  (2-3 home health )              Contractures      Additional Factors Info  Code Status, Allergies, Isolation Precautions Code Status Info:  (DNR ) Allergies Info:  (Aricept Donepezil Hcl, Codeine, Ketek Telithromycin)     Isolation Precautions Info:  (MRSA Nasal Swab and Enteric precautions. )     Current Medications (03/13/2017):  This is the current hospital active medication list Current Facility-Administered Medications  Medication Dose Route Frequency Provider Last Rate Last Dose  . 0.9 %  sodium chloride infusion   Intravenous Continuous Altamese DillingVachhani, Vaibhavkumar, MD 50 mL/hr at 03/12/17 2129    . acetaminophen (TYLENOL) tablet 650 mg  650 mg Oral Q4H PRN Altamese DillingVachhani, Vaibhavkumar, MD      . amLODipine (NORVASC) tablet 5 mg  5 mg Oral Daily Altamese DillingVachhani, Vaibhavkumar, MD   5 mg at 03/12/17 2048  . amoxicillin-clavulanate (AUGMENTIN) 875-125 MG per tablet 1 tablet  1 tablet Oral Q12H Oralia ManisWillis, David, MD   1 tablet at 03/13/17 0730  . aspirin EC tablet 81 mg  81 mg Oral Daily Altamese Dilling, MD      . budesonide (PULMICORT) nebulizer solution 0.25 mg  0.25 mg Nebulization BID Oralia Manis, MD   0.25 mg at 03/13/17 1610  . carvedilol (COREG) tablet 12.5 mg  12.5 mg Oral BID WC Altamese Dilling, MD   12.5 mg at 03/13/17 0730  . citalopram (CELEXA) tablet 30 mg  30 mg Oral Daily Altamese Dilling, MD      . docusate sodium (COLACE) capsule  100 mg  100 mg Oral BID PRN Altamese Dilling, MD      . fluticasone (FLONASE) 50 MCG/ACT nasal spray 2 spray  2 spray Each Nare Daily Altamese Dilling, MD      . furosemide (LASIX) tablet 20 mg  20 mg Oral Daily Altamese Dilling, MD      . guaiFENesin (ROBITUSSIN) 100 MG/5ML solution 100 mg  5 mL Oral Q4H PRN Altamese Dilling, MD      . heparin injection 5,000 Units  5,000 Units Subcutaneous Q8H Altamese Dilling, MD   5,000 Units at 03/13/17 0432  . ipratropium-albuterol (DUONEB) 0.5-2.5 (3) MG/3ML nebulizer solution 3 mL  3 mL Nebulization TID Auburn Bilberry, MD   3 mL at 03/13/17 0733  . loratadine (CLARITIN) tablet 10 mg  10 mg Oral Daily Altamese Dilling, MD      . losartan (COZAAR) tablet 100 mg  100 mg Oral Daily Altamese Dilling, MD      . montelukast (SINGULAIR) tablet 10 mg  10 mg Oral QHS Altamese Dilling, MD   10 mg at 03/12/17 2236  . moxifloxacin (VIGAMOX) 0.5 % ophthalmic solution 1 drop  1 drop Left Eye TID Altamese Dilling, MD   1 drop at 03/12/17 2237  . pantoprazole (PROTONIX) EC tablet 40 mg  40 mg Oral Daily Altamese Dilling, MD      . simvastatin (ZOCOR) tablet 20 mg  20 mg Oral Daily Altamese Dilling, MD      . vitamin B-12 (CYANOCOBALAMIN) tablet 1,000 mcg  1,000 mcg Oral Daily Altamese Dilling, MD         Discharge Medications: Please see discharge summary for a list of discharge medications.  Relevant Imaging Results:  Relevant Lab Results:   Additional Information  (SSN: [id number])  Ross Bender, Darleen Crocker, LCSW

## 2017-03-13 NOTE — Progress Notes (Signed)
Patient is medically stable for D/C back to [facility] today. Per Conservator, museum/galleryAmber Resident Care Coordinator at [facility] patient can return today. Clinical Child psychotherapistocial Worker (CSW) sent D/C summary and FL2 to [facility]. RN will call report and patient's daughter Truddie HiddenLou will transport. Per RN patient's daughter was at bedside but stepped out of the room when CSW came around. CSW left Truddie HiddenLou a voicemail making her aware of above. Please reconsult if future social work needs arise. CSW signing off.   [employer] Hughes Inc[employer]Bailey Aziah Brostrom, LCSW [phone]

## 2017-03-13 NOTE — Care Management Important Message (Signed)
Important Message  Patient Details  Name: Daisy [name] First MRN: [id number] Date of Birth: [birth year]   Medicare Important Message Given:  No (admitted obs less than 24 hours)    Chapman FitchBOWEN, Breckan Cafiero T, RN 03/13/2017, 1:45 PM

## 2017-03-13 NOTE — Clinical Social Work Note (Signed)
Clinical Social Work Assessment  Patient Details  Name: Franki CabotMolene T Hocevar MRN: [id number] Date of Birth: [birth year]  Date of referral:  03/13/17               Reason for consult:  Other (Comment Required) (From [facility] )                Permission sought to share information with:  Oceanographeracility Contact Representative Permission granted to share information::  Yes, Verbal Permission Granted  Name::      [facility]   Agency::     Relationship::     Contact Information:     Housing/Transportation Living arrangements for the past 2 months:  Assisted Living Facility Source of Information:  Adult Children, Facility Patient Interpreter Needed:  None Criminal Activity/Legal Involvement Pertinent to Current Situation/Hospitalization:  No - Comment as needed Significant Relationships:  Adult Children Lives with:  Facility Resident Do you feel safe going back to the place where you live?    Need for family participation in patient care:  Yes (Comment)  Care giving concerns:  Patient is a resident at [facility] (fax: [phone]).   Social Worker assessment / plan:  Visual merchandiserClinical Social Worker (CSW) received a consult that patient is from [facility]. CSW contacted Conservator, museum/galleryAmber resident care coordinator at [facility] to get additional information. [facility] been a [facility] for over 1 year now and has a private room. [facility] patient uses a wheel chair for long distances and can walk with a walker for short distances. [facility] is on room air at baseline and [facility] was about to open patient pending the family's final decision. [facility] can return to [facility] when stable. CSW contacted patient's daughter Truddie HiddenLou. Per Truddie HiddenLou she understands patient is under observation and is concerned about the hospital bill. Truddie HiddenLou is agreeable for patient to return to [facility] ASAP. MD aware of above. CSW will continue to follow and assist as needed.   Employment status:  Retired Glass blower/designernsurance  information:  Managed Medicare PT Recommendations:  Not assessed at this time Information / Referral to community resources:  Other (Comment Required) (Patient will return to [facility] )  Patient/Family's Response to care:  Patient's daughter Truddie HiddenLou is agreeable for patient to return to [facility].   Patient/Family's Understanding of and Emotional Response to Diagnosis, Current Treatment, and Prognosis:  Patient's daughter Truddie HiddenLou was very pleasant and thanked CSW for calling.   Emotional Assessment Appearance:  Appears stated age Attitude/Demeanor/Rapport:    Affect (typically observed):  Unable to Assess Orientation:  Oriented to Self, Fluctuating Orientation (Suspected and/or reported Sundowners) Alcohol / Substance use:  Not Applicable Psych involvement (Current and /or in the community):  No (Comment)  Discharge Needs  Concerns to be addressed:  Discharge Planning Concerns Readmission within the last 30 days:  No Current discharge risk:  Dependent with Mobility Barriers to Discharge:  Continued Medical Work up   [employer], Darleen CrockerBailey M, LCSW 03/13/2017, 9:07 AM

## 2017-03-15 DIAGNOSIS — R627 Adult failure to thrive: Secondary | ICD-10-CM | POA: Diagnosis not present

## 2017-03-15 DIAGNOSIS — F0280 Dementia in other diseases classified elsewhere without behavioral disturbance: Secondary | ICD-10-CM | POA: Diagnosis not present

## 2017-03-15 DIAGNOSIS — F329 Major depressive disorder, single episode, unspecified: Secondary | ICD-10-CM | POA: Diagnosis not present

## 2017-03-15 DIAGNOSIS — G309 Alzheimer's disease, unspecified: Secondary | ICD-10-CM | POA: Diagnosis not present

## 2017-04-25 DIAGNOSIS — R062 Wheezing: Secondary | ICD-10-CM | POA: Diagnosis not present

## 2017-04-25 DIAGNOSIS — J4541 Moderate persistent asthma with (acute) exacerbation: Secondary | ICD-10-CM | POA: Diagnosis not present

## 2017-05-06 ENCOUNTER — Telehealth: Payer: Self-pay | Admitting: Student-PharmD

## 2017-05-19 IMAGING — CR DG HIP (WITH PELVIS) OPERATIVE*R*
2 series · 2 of 2 positions shown · non-contrast
Comparison: 09/30/2015

CLINICAL DATA: OPERATIVE

EXAM:
OPERATIVE RIGHT HIP (WITH PELVIS IF PERFORMED) 4 VIEWS
TECHNIQUE: Fluoroscopic spot image(s) were submitted for interpretation
post-operatively.

[1]
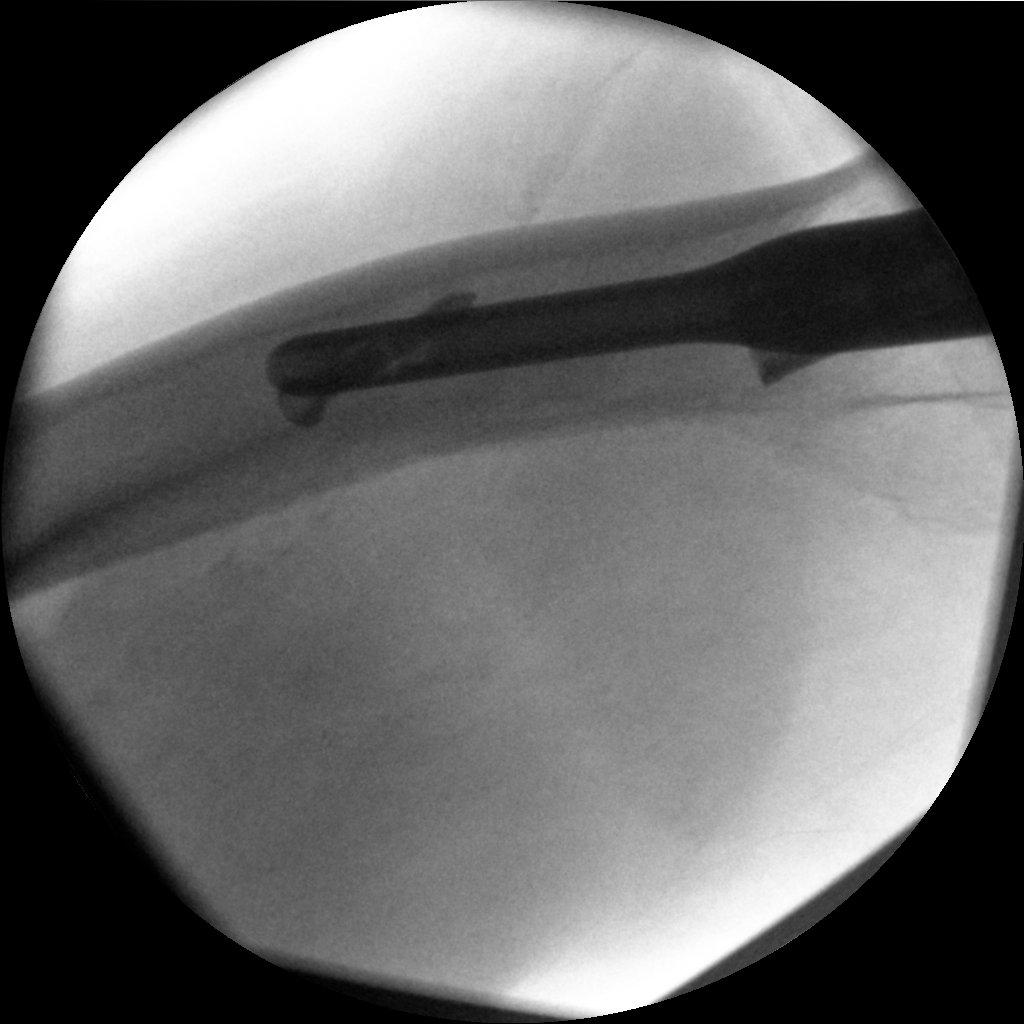

[4]
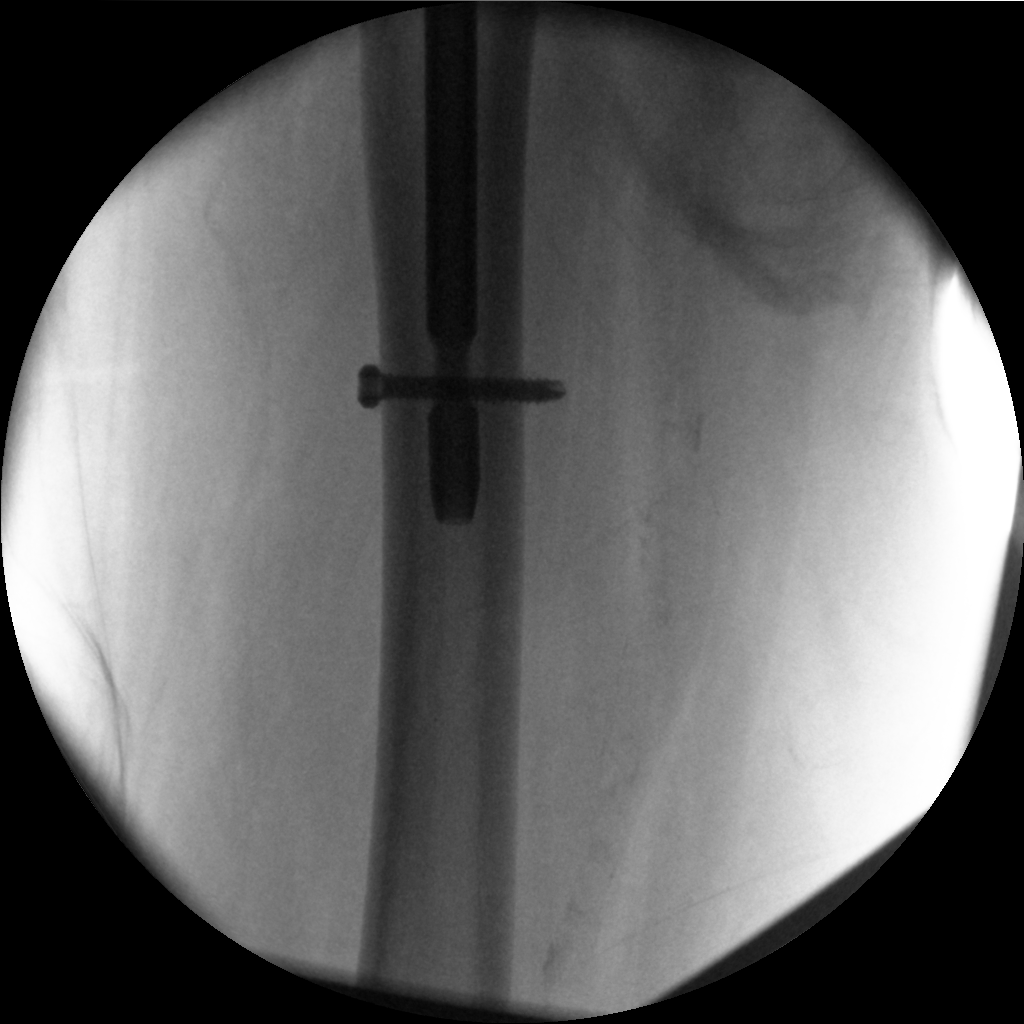

[2 of 2 positions shown; findings below may reference images not displayed]

FINDINGS: Status post placement of intra medullary rod with lag screw in the
right hip. Femoral head appears normally located in the acetabulum.
No new fractures are identified.
IMPRESSION: Status post ORIF of the right hip.

## 2017-05-19 IMAGING — DX DG HIP (WITH OR WITHOUT PELVIS) 1V PORT*R*
2 series · 3 of 3 positions shown · non-contrast
Comparison: 09/30/2015

CLINICAL DATA: Postop

EXAM:
DG HIP (WITH OR WITHOUT PELVIS) 1V PORT RIGHT

[Series 1: pelvis ap · 0.14mm/px · 2 of 2 slices shown]
[im 1/2]
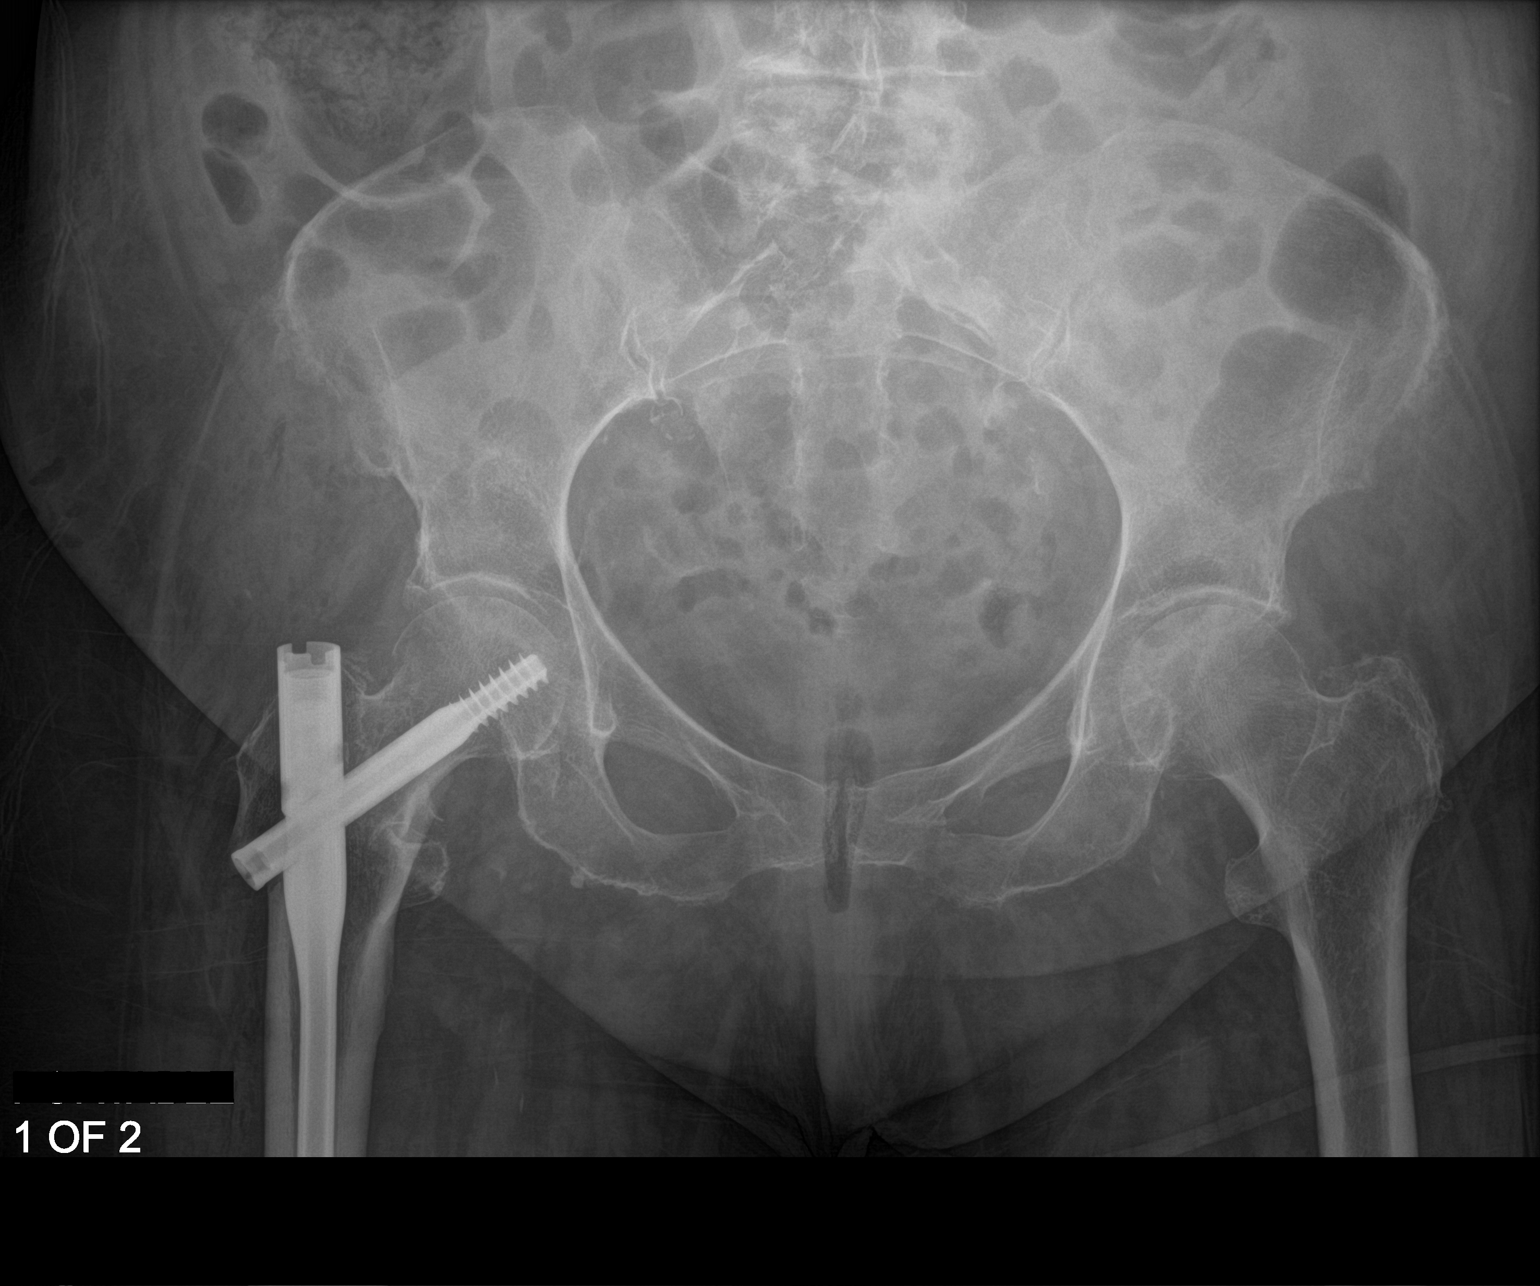
[im 2/2]
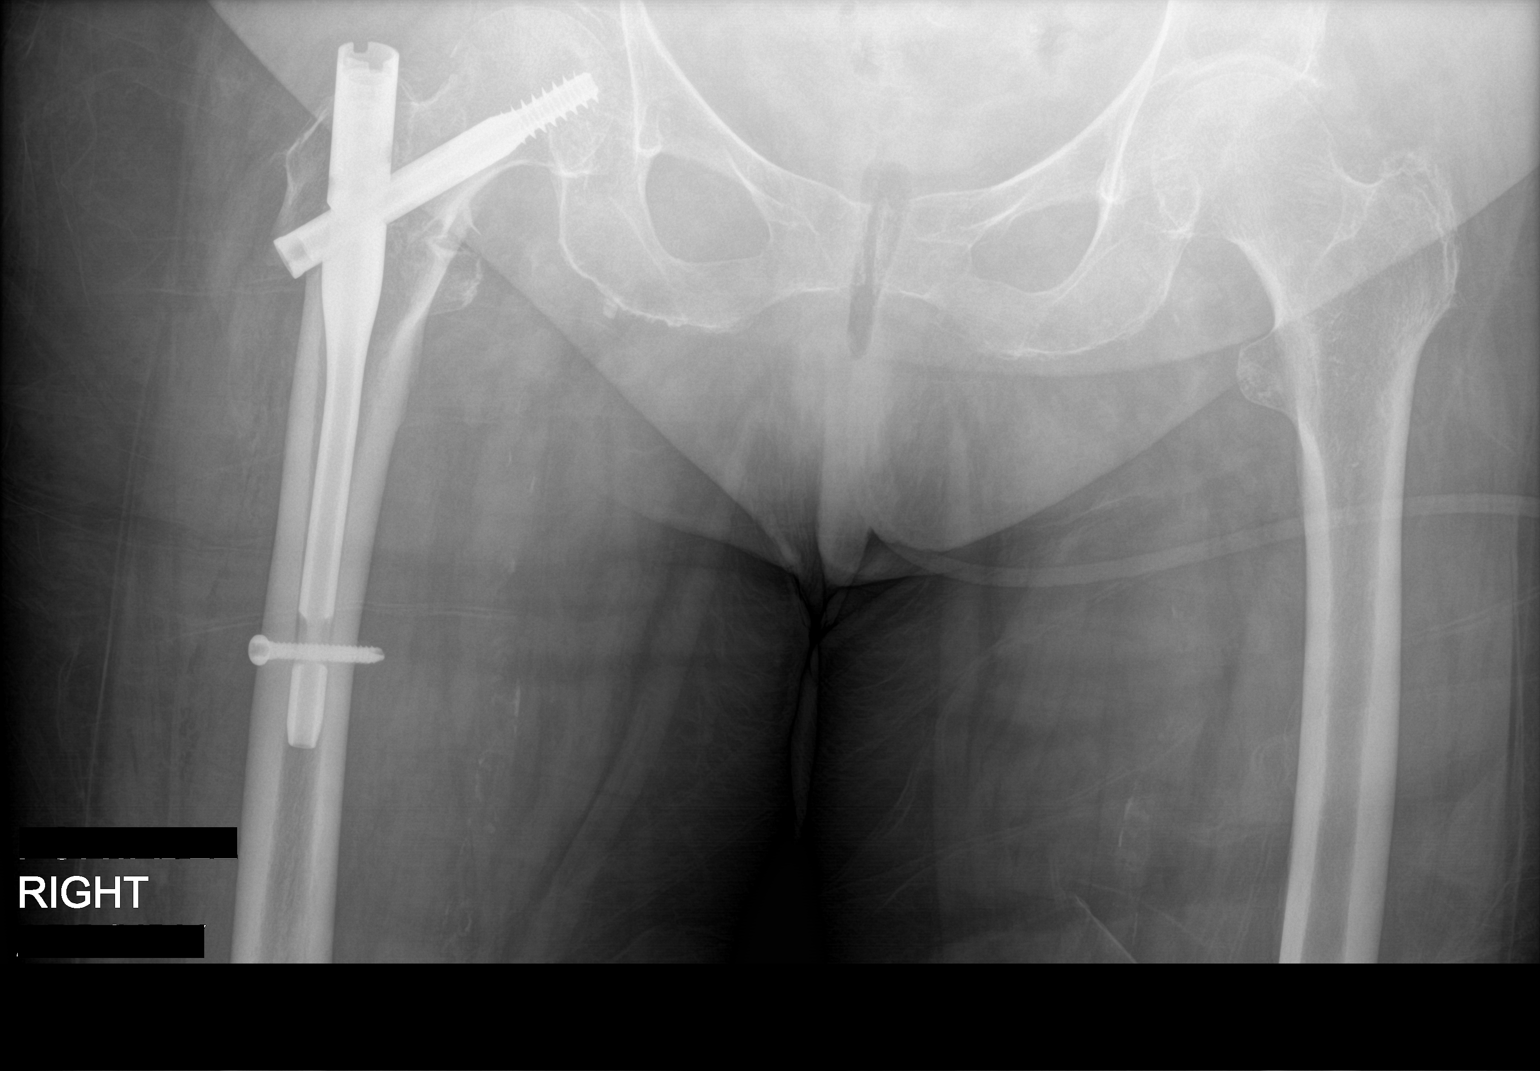

[hip lat]
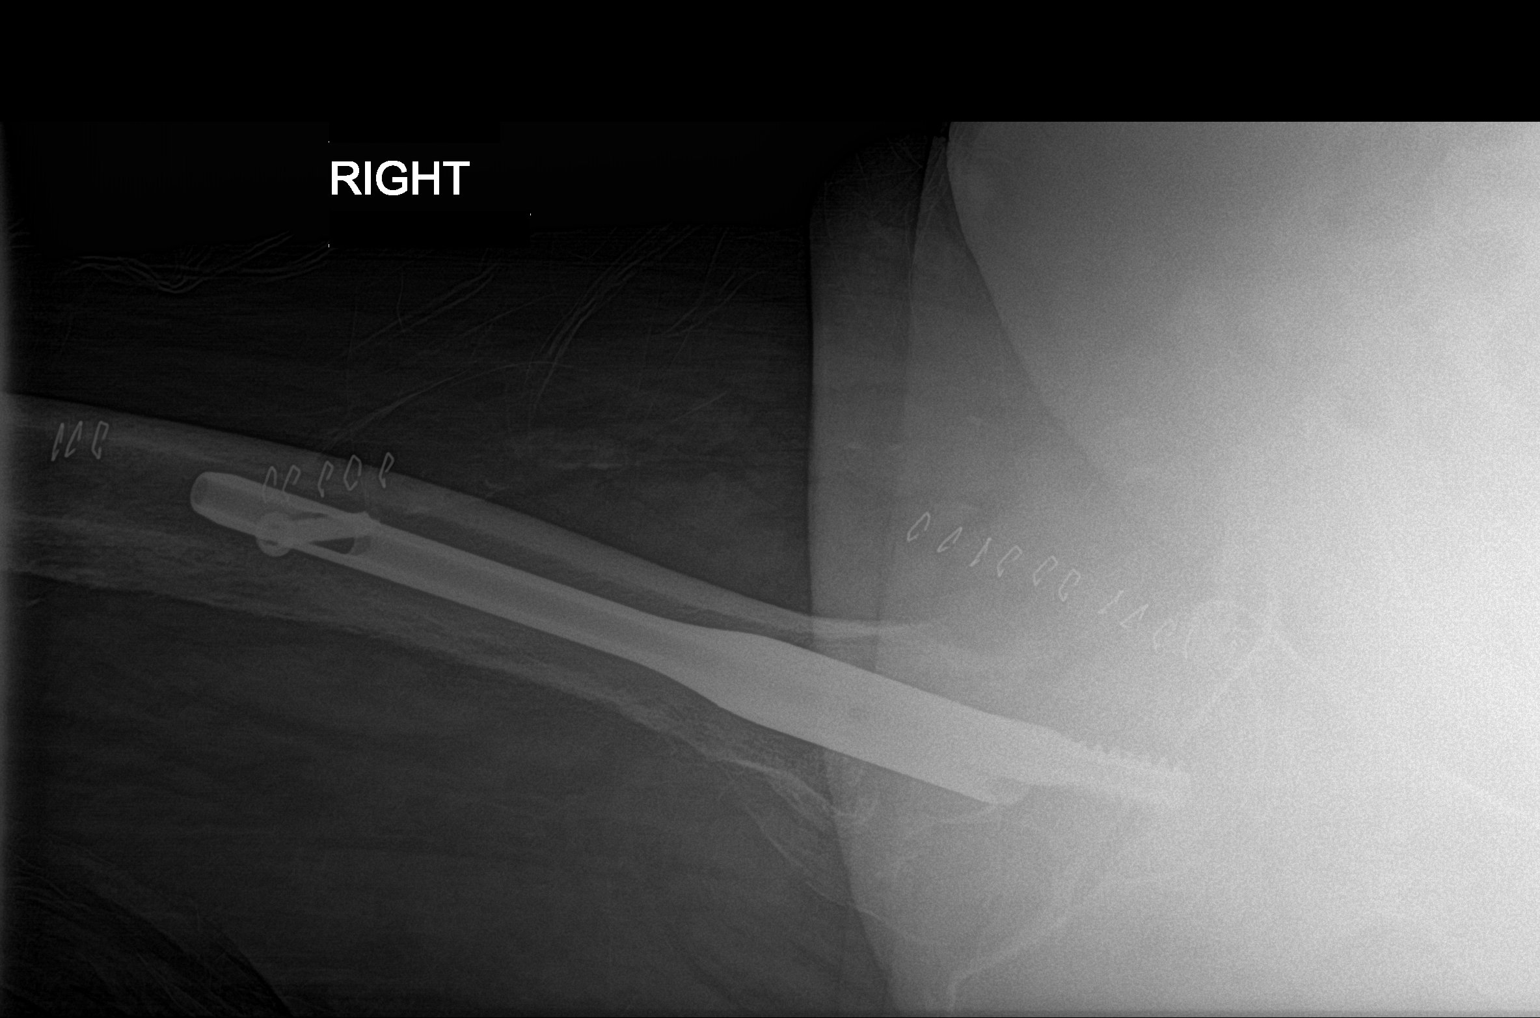

[3 of 3 positions shown; findings below may reference images not displayed]

FINDINGS: Three views of the right hip submitted. The patient is status post
intraoperative repair of right intertrochanteric fracture. There is
intra medullary rod in proximal right femur. A dynamic locking screw
is noted along the femoral neck. A fixation screw is noted in
proximal shaft of the right femur. There is anatomic alignment.
IMPRESSION: Status post intraoperative repair of intertrochanteric right femoral
fracture. Metallic fixation material in proximal right femur with
anatomic alignment.

## 2017-09-01 ENCOUNTER — Emergency Department: Payer: Medicare Other

## 2017-09-01 ENCOUNTER — Other Ambulatory Visit: Payer: Self-pay

## 2017-09-01 ENCOUNTER — Encounter: Payer: Self-pay | Admitting: Emergency Medicine

## 2017-09-01 ENCOUNTER — Inpatient Hospital Stay
Admission: EM | Admit: 2017-09-01 | Discharge: 2017-09-03 | DRG: 689 | Disposition: A | Discharge status: Home Health | Payer: Medicare Other | Attending: Internal Medicine | Admitting: Internal Medicine

## 2017-09-01 DIAGNOSIS — K219 Gastro-esophageal reflux disease without esophagitis: Secondary | ICD-10-CM | POA: Diagnosis present

## 2017-09-01 DIAGNOSIS — F329 Major depressive disorder, single episode, unspecified: Secondary | ICD-10-CM | POA: Diagnosis present

## 2017-09-01 DIAGNOSIS — Z888 Allergy status to other drugs, medicaments and biological substances status: Secondary | ICD-10-CM | POA: Diagnosis not present

## 2017-09-01 DIAGNOSIS — Z8673 Personal history of transient ischemic attack (TIA), and cerebral infarction without residual deficits: Secondary | ICD-10-CM | POA: Diagnosis not present

## 2017-09-01 DIAGNOSIS — I669 Occlusion and stenosis of unspecified cerebral artery: Secondary | ICD-10-CM | POA: Diagnosis present

## 2017-09-01 DIAGNOSIS — Z881 Allergy status to other antibiotic agents status: Secondary | ICD-10-CM

## 2017-09-01 DIAGNOSIS — J441 Chronic obstructive pulmonary disease with (acute) exacerbation: Secondary | ICD-10-CM | POA: Diagnosis present

## 2017-09-01 DIAGNOSIS — E785 Hyperlipidemia, unspecified: Secondary | ICD-10-CM | POA: Diagnosis present

## 2017-09-01 DIAGNOSIS — Z800 Family history of malignant neoplasm of digestive organs: Secondary | ICD-10-CM | POA: Diagnosis not present

## 2017-09-01 DIAGNOSIS — R410 Disorientation, unspecified: Secondary | ICD-10-CM

## 2017-09-01 DIAGNOSIS — B9620 Unspecified Escherichia coli [E. coli] as the cause of diseases classified elsewhere: Secondary | ICD-10-CM | POA: Diagnosis present

## 2017-09-01 DIAGNOSIS — Z7982 Long term (current) use of aspirin: Secondary | ICD-10-CM

## 2017-09-01 DIAGNOSIS — E860 Dehydration: Secondary | ICD-10-CM | POA: Diagnosis present

## 2017-09-01 DIAGNOSIS — F0390 Unspecified dementia without behavioral disturbance: Secondary | ICD-10-CM | POA: Diagnosis present

## 2017-09-01 DIAGNOSIS — Z87891 Personal history of nicotine dependence: Secondary | ICD-10-CM | POA: Diagnosis not present

## 2017-09-01 DIAGNOSIS — N183 Chronic kidney disease, stage 3 (moderate): Secondary | ICD-10-CM | POA: Diagnosis present

## 2017-09-01 DIAGNOSIS — I129 Hypertensive chronic kidney disease with stage 1 through stage 4 chronic kidney disease, or unspecified chronic kidney disease: Secondary | ICD-10-CM | POA: Diagnosis present

## 2017-09-01 DIAGNOSIS — N179 Acute kidney failure, unspecified: Secondary | ICD-10-CM | POA: Diagnosis present

## 2017-09-01 DIAGNOSIS — G9341 Metabolic encephalopathy: Secondary | ICD-10-CM | POA: Diagnosis present

## 2017-09-01 DIAGNOSIS — M1990 Unspecified osteoarthritis, unspecified site: Secondary | ICD-10-CM | POA: Diagnosis present

## 2017-09-01 DIAGNOSIS — N3000 Acute cystitis without hematuria: Secondary | ICD-10-CM

## 2017-09-01 DIAGNOSIS — Z885 Allergy status to narcotic agent status: Secondary | ICD-10-CM | POA: Diagnosis not present

## 2017-09-01 DIAGNOSIS — J449 Chronic obstructive pulmonary disease, unspecified: Secondary | ICD-10-CM | POA: Diagnosis present

## 2017-09-01 DIAGNOSIS — I252 Old myocardial infarction: Secondary | ICD-10-CM

## 2017-09-01 DIAGNOSIS — N390 Urinary tract infection, site not specified: Secondary | ICD-10-CM | POA: Diagnosis present

## 2017-09-01 DIAGNOSIS — Z66 Do not resuscitate: Secondary | ICD-10-CM | POA: Diagnosis present

## 2017-09-01 LAB — CBC WITH DIFFERENTIAL/PLATELET
BASOS ABS: 0 10*3/uL (ref 0–0.1)
Basophils Relative: 1 %
EOS ABS: 0.2 10*3/uL (ref 0–0.7)
EOS PCT: 2 %
HCT: 30.9 % — ABNORMAL LOW (ref 35.0–47.0)
Hemoglobin: 10 g/dL — ABNORMAL LOW (ref 12.0–16.0)
Lymphocytes Relative: 11 %
Lymphs Abs: 1 10*3/uL (ref 1.0–3.6)
MCH: 27.7 pg (ref 26.0–34.0)
MCHC: 32.4 g/dL (ref 32.0–36.0)
MCV: 85.6 fL (ref 80.0–100.0)
Monocytes Absolute: 1.1 10*3/uL — ABNORMAL HIGH (ref 0.2–0.9)
Monocytes Relative: 12 %
Neutro Abs: 7.1 10*3/uL — ABNORMAL HIGH (ref 1.4–6.5)
Neutrophils Relative %: 74 %
PLATELETS: 191 10*3/uL (ref 150–440)
RBC: 3.61 MIL/uL — AB (ref 3.80–5.20)
RDW: 15.2 % — ABNORMAL HIGH (ref 11.5–14.5)
WBC: 9.4 10*3/uL (ref 3.6–11.0)

## 2017-09-01 LAB — URINALYSIS, COMPLETE (UACMP) WITH MICROSCOPIC
Bilirubin Urine: NEGATIVE
GLUCOSE, UA: NEGATIVE mg/dL
HGB URINE DIPSTICK: NEGATIVE
Ketones, ur: NEGATIVE mg/dL
Nitrite: NEGATIVE
Protein, ur: 100 mg/dL — AB
Specific Gravity, Urine: 1.016 (ref 1.005–1.030)
pH: 5 (ref 5.0–8.0)

## 2017-09-01 LAB — COMPREHENSIVE METABOLIC PANEL
ALK PHOS: 61 U/L (ref 38–126)
ALT: 9 U/L — AB (ref 14–54)
AST: 17 U/L (ref 15–41)
Albumin: 3.2 g/dL — ABNORMAL LOW (ref 3.5–5.0)
Anion gap: 7 (ref 5–15)
BUN: 30 mg/dL — ABNORMAL HIGH (ref 6–20)
CO2: 30 mmol/L (ref 22–32)
CREATININE: 1.61 mg/dL — AB (ref 0.44–1.00)
Calcium: 8.1 mg/dL — ABNORMAL LOW (ref 8.9–10.3)
Chloride: 99 mmol/L — ABNORMAL LOW (ref 101–111)
GFR calc non Af Amer: 26 mL/min — ABNORMAL LOW (ref >60)
GFR, EST AFRICAN AMERICAN: 31 mL/min — AB (ref >60)
Glucose, Bld: 131 mg/dL — ABNORMAL HIGH (ref 65–99)
Potassium: 3.7 mmol/L (ref 3.5–5.1)
Sodium: 136 mmol/L (ref 135–145)
Total Bilirubin: 0.8 mg/dL (ref 0.3–1.2)
Total Protein: 6.7 g/dL (ref 6.5–8.1)

## 2017-09-01 LAB — TROPONIN I: Troponin I: <0.03 ng/mL (ref <0.03)

## 2017-09-01 MED ORDER — ENOXAPARIN SODIUM 30 MG/0.3ML ~~LOC~~ SOLN
30.0000 mg | SUBCUTANEOUS | Status: DC
  Administered 2017-09-01 – 2017-09-02 (×2): 30 mg via SUBCUTANEOUS
  Filled 2017-09-01 (×2): qty 0.3

## 2017-09-01 MED ORDER — POLYETHYLENE GLYCOL 3350 17 G PO PACK: 17.0000 g | PACK | Freq: Every day | ORAL | Status: DC | PRN

## 2017-09-01 MED ORDER — ALBUTEROL SULFATE (2.5 MG/3ML) 0.083% IN NEBU
2.5000 mg | INHALATION_SOLUTION | RESPIRATORY_TRACT | Status: DC | PRN
  Administered 2017-09-01: 2.5 mg via RESPIRATORY_TRACT
  Filled 2017-09-01: qty 3

## 2017-09-01 MED ORDER — MONTELUKAST SODIUM 10 MG PO TABS
10.0000 mg | ORAL_TABLET | Freq: Every day | ORAL | Status: DC
  Administered 2017-09-01 – 2017-09-02 (×2): 10 mg via ORAL
  Filled 2017-09-01 (×2): qty 1

## 2017-09-01 MED ORDER — MOXIFLOXACIN HCL 0.5 % OP SOLN
1.0000 [drp] | Freq: Three times a day (TID) | OPHTHALMIC | Status: DC
  Administered 2017-09-01 – 2017-09-03 (×5): 1 [drp] via OPHTHALMIC
  Filled 2017-09-01: qty 3

## 2017-09-01 MED ORDER — IPRATROPIUM-ALBUTEROL 0.5-2.5 (3) MG/3ML IN SOLN
3.0000 mL | Freq: Four times a day (QID) | RESPIRATORY_TRACT | Status: DC
  Administered 2017-09-01: 3 mL via RESPIRATORY_TRACT
  Filled 2017-09-01: qty 3

## 2017-09-01 MED ORDER — SIMVASTATIN 20 MG PO TABS
20.0000 mg | ORAL_TABLET | Freq: Every day | ORAL | Status: DC
  Administered 2017-09-02 – 2017-09-03 (×2): 20 mg via ORAL
  Filled 2017-09-01 (×2): qty 1

## 2017-09-01 MED ORDER — CITALOPRAM HYDROBROMIDE 20 MG PO TABS
30.0000 mg | ORAL_TABLET | Freq: Every day | ORAL | Status: DC
  Administered 2017-09-02 – 2017-09-03 (×2): 30 mg via ORAL
  Filled 2017-09-01 (×2): qty 2

## 2017-09-01 MED ORDER — METHYLPREDNISOLONE SODIUM SUCC 125 MG IJ SOLR
60.0000 mg | INTRAMUSCULAR | Status: DC
  Administered 2017-09-01 – 2017-09-02 (×2): 60 mg via INTRAVENOUS
  Filled 2017-09-01 (×2): qty 2

## 2017-09-01 MED ORDER — PANTOPRAZOLE SODIUM 40 MG PO TBEC
40.0000 mg | DELAYED_RELEASE_TABLET | Freq: Every day | ORAL | Status: DC
  Administered 2017-09-02 – 2017-09-03 (×2): 40 mg via ORAL
  Filled 2017-09-01 (×2): qty 1

## 2017-09-01 MED ORDER — ASPIRIN EC 81 MG PO TBEC
81.0000 mg | DELAYED_RELEASE_TABLET | Freq: Every day | ORAL | Status: DC
  Administered 2017-09-02 – 2017-09-03 (×2): 81 mg via ORAL
  Filled 2017-09-01 (×2): qty 1

## 2017-09-01 MED ORDER — SODIUM CHLORIDE 0.9 % IV SOLN
3.0000 g | Freq: Two times a day (BID) | INTRAVENOUS | Status: DC
  Administered 2017-09-01 – 2017-09-03 (×4): 3 g via INTRAVENOUS
  Filled 2017-09-01 (×5): qty 3

## 2017-09-01 MED ORDER — ACETAMINOPHEN 650 MG RE SUPP
650.0000 mg | Freq: Four times a day (QID) | RECTAL | Status: DC | PRN
Start: 2017-09-01 — End: 2017-09-03

## 2017-09-01 MED ORDER — SENNOSIDES-DOCUSATE SODIUM 8.6-50 MG PO TABS
2.0000 | ORAL_TABLET | Freq: Every day | ORAL | Status: DC
  Administered 2017-09-02 – 2017-09-03 (×2): 2 via ORAL
  Filled 2017-09-01 (×2): qty 2

## 2017-09-01 MED ORDER — ONDANSETRON HCL 4 MG PO TABS: 4.0000 mg | ORAL_TABLET | Freq: Four times a day (QID) | ORAL | Status: DC | PRN

## 2017-09-01 MED ORDER — SODIUM CHLORIDE 0.9 % IV BOLUS
500.0000 mL | Freq: Once | INTRAVENOUS | Status: AC
  Administered 2017-09-01: 500 mL via INTRAVENOUS

## 2017-09-01 MED ORDER — LOSARTAN POTASSIUM 50 MG PO TABS
50.0000 mg | ORAL_TABLET | Freq: Every day | ORAL | Status: DC
Start: 2017-09-02 — End: 2017-09-03
  Administered 2017-09-02 – 2017-09-03 (×2): 50 mg via ORAL
  Filled 2017-09-01 (×2): qty 1

## 2017-09-01 MED ORDER — LORATADINE 10 MG PO TABS
10.0000 mg | ORAL_TABLET | Freq: Every day | ORAL | Status: DC
  Administered 2017-09-02 – 2017-09-03 (×2): 10 mg via ORAL
  Filled 2017-09-01 (×2): qty 1

## 2017-09-01 MED ORDER — CARVEDILOL 12.5 MG PO TABS
12.5000 mg | ORAL_TABLET | Freq: Two times a day (BID) | ORAL | Status: DC
  Administered 2017-09-01 – 2017-09-03 (×4): 12.5 mg via ORAL
  Filled 2017-09-01 (×3): qty 1

## 2017-09-01 MED ORDER — SODIUM CHLORIDE 0.9 % IV SOLN
INTRAVENOUS | Status: AC
  Administered 2017-09-01: 18:00:00 via INTRAVENOUS

## 2017-09-01 MED ORDER — SODIUM CHLORIDE 0.9 % IV SOLN
1.0000 g | Freq: Once | INTRAVENOUS | Status: AC
  Administered 2017-09-01: 1 g via INTRAVENOUS
  Filled 2017-09-01: qty 1000

## 2017-09-01 MED ORDER — GUAIFENESIN 100 MG/5ML PO SOLN
5.0000 mL | ORAL | Status: DC | PRN
  Filled 2017-09-01: qty 5

## 2017-09-01 MED ORDER — ONDANSETRON HCL 4 MG/2ML IJ SOLN: 4.0000 mg | Freq: Four times a day (QID) | INTRAMUSCULAR | Status: DC | PRN

## 2017-09-01 MED ORDER — ACETAMINOPHEN 325 MG PO TABS
650.0000 mg | ORAL_TABLET | Freq: Four times a day (QID) | ORAL | Status: DC | PRN
Start: 2017-09-01 — End: 2017-09-03

## 2017-09-01 NOTE — ED Provider Notes (Signed)
[facility] Emergency Department Provider Note  ____________________________________________   First MD Initiated Contact with Patient 09/01/17 1326     (approximate)  I have reviewed the triage vital signs and the nursing notes.   HISTORY  Chief Complaint Altered Mental Status  History limited by the patient's clinical condition  HPI Daisy [name] is a 84 y.o. female who comes to the emergency department via EMS from nursing home for "not acting like herself" for the past week.  The patient herself has significant dementia, is quite confused, and can provide no meaningful history.  Past Medical History:  Diagnosis Date  . Acute renal failure (HCC)   . Anemia   . Asthma   . Cerebral artery occlusion    with cerebral infarction  . Dementia   . Depression 06/05/2013  . Edema   . Essential hypertension, benign 06/05/2013  . GERD (gastroesophageal reflux disease) 06/05/2013  . Heart attack (HCC)   . Osteoarthritis   . Other and unspecified hyperlipidemia 06/05/2013  . Paronychia, toe   . Recurrent UTI   . Stroke [facility])     Patient Active Problem List   Diagnosis Date Noted  . UTI (urinary tract infection) 09/01/2017  . Diarrhea 03/12/2017  . Sepsis (HCC) 02/23/2017  . Rectal bleed 11/11/2015  . Dementia 11/11/2015  . COPD (chronic obstructive pulmonary disease) (HCC) 11/11/2015  . Dyslipidemia 11/11/2015  . Infection of urinary tract 10/04/2015  . Escherichia coli (E. coli) infection 10/04/2015  . CKD (chronic kidney disease), stage III (HCC) 10/04/2015  . Hyperglycemia 10/04/2015  . MRSA carrier 10/04/2015  . Leukocytosis 10/04/2015  . Thrombocytopenia (HCC) 10/04/2015  . Closed right hip fracture (HCC) 09/30/2015  . Recurrent UTI 11/12/2014  . Microscopic hematuria 11/12/2014  . Atrophic vaginitis 11/12/2014  . CVA (cerebral infarction) 06/05/2013  . Hypotension, unspecified 06/05/2013  . Other and unspecified hyperlipidemia  06/05/2013  . Acute renal failure (HCC) 06/05/2013  . Leukocytosis, unspecified 06/05/2013  . Essential hypertension, benign 06/05/2013  . Depression 06/05/2013  . GERD (gastroesophageal reflux disease) 06/05/2013    Past Surgical History:  Procedure Laterality Date  . CARDIAC SURGERY     Left BBB  . FEMUR IM NAIL Right 10/01/2015   Procedure: INTRAMEDULLARY (IM) NAIL FEMORAL;  Surgeon: Juanell Fairly, MD;  Location: [facility] ORS;  Service: Orthopedics;  Laterality: Right;  . KNEE SURGERY Right     Prior to Admission medications   Medication Sig Start Date End Date Taking? Authorizing Provider  acetaminophen (TYLENOL) 325 MG tablet Take 650 mg by mouth every 4 (four) hours as needed for fever.   Yes [provider]  aspirin EC 81 MG tablet Take 81 mg by mouth daily.   Yes [provider]  carvedilol (COREG) 12.5 MG tablet Take 1 tablet (12.5 mg total) by mouth 2 (two) times daily with a meal. 06/09/13  Yes Rodolph Bong, MD  cetirizine (ZYRTEC) 10 MG tablet Take 10 mg by mouth daily.   Yes [provider]  citalopram (CELEXA) 20 MG tablet Take 30 mg by mouth daily.    Yes [provider]  furosemide (LASIX) 20 MG tablet Take 20 mg by mouth daily.    Yes [provider]  guaiFENesin (ROBITUSSIN) 100 MG/5ML SOLN Take 5 mLs (100 mg total) by mouth every 4 (four) hours as needed for cough or to loosen phlegm. 02/27/17  Yes Auburn Bilberry, MD  ipratropium-albuterol (DUONEB) 0.5-2.5 (3) MG/3ML SOLN Take 3 mLs by nebulization 3 (  three) times daily. 02/27/17  Yes Auburn BilberryPatel, Shreyang, MD  loperamide (IMODIUM A-D) 2 MG tablet Take 4 mg by mouth as needed for diarrhea or loose stools. Do not exceed 8 doses in 24 hours.   Yes [provider]  losartan (COZAAR) 100 MG tablet Take 1 tablet (100 mg total) by mouth daily. Patient taking differently: Take 50 mg by mouth daily.  02/27/17 02/27/18 Yes Auburn BilberryPatel, Shreyang, MD  magnesium hydroxide (MILK OF  MAGNESIA) 400 MG/5ML suspension Take 30 mLs by mouth daily as needed for mild constipation.   Yes [provider]  montelukast (SINGULAIR) 10 MG tablet Take 10 mg by mouth at bedtime.   Yes [provider]  moxifloxacin (VIGAMOX) 0.5 % ophthalmic solution Place 1 drop into the left eye 3 (three) times daily.   Yes [provider]  nystatin cream (MYCOSTATIN) Apply 1 application topically 2 (two) times daily.   Yes [provider]  omeprazole (PRILOSEC) 20 MG capsule Take 20 mg by mouth daily before breakfast.    Yes [provider]  senna-docusate (SENOKOT-S) 8.6-50 MG tablet Take 1-2 tablets by mouth daily.   Yes [provider]  simvastatin (ZOCOR) 20 MG tablet Take 20 mg by mouth daily.    Yes [provider]  vitamin B-12 (CYANOCOBALAMIN) 1000 MCG tablet Take 1,000 mcg by mouth daily.   Yes [provider]  cephALEXin (KEFLEX) 250 MG capsule Take 1 capsule (250 mg total) by mouth 2 (two) times daily for 2 days. 09/03/17 09/05/17  Delfino LovettShah, Vipul, MD    Allergies Aricept Palma Holter[donepezil hcl]; Codeine; and Ketek [telithromycin]  Family History  Problem Relation Age of Onset  . Prostate cancer Son   . Cancer Mother        Oral  . Colon cancer Sister   . Kidney disease Neg Hx   . Bladder Cancer Neg Hx     Social History Social History   Tobacco Use  . Smoking status: Former Games developermoker  . Smokeless tobacco: Never Used  . Tobacco comment: quit 50 + years  Substance Use Topics  . Alcohol use: Yes    Alcohol/week: 0.0 oz    Comment: occasionally  . Drug use: No    Review of Systems History limited by the patient's clinical condition ____________________________________________   PHYSICAL EXAM:  VITAL SIGNS: ED Triage Vitals [09/01/17 1325]  Enc Vitals Group     BP      Pulse      Resp      Temp      Temp src      SpO2      Weight 160 lb (72.6 kg)     Height 5\' 1"  (1.549 m)     Head Circumference      Peak  Flow      Pain Score 0     Pain Loc      Pain Edu?      Excl. in GC?     Constitutional: Pleasant, no acute distress, severe dementia Eyes: PERRL EOMI. mid range and brisk Head: Atraumatic. Nose: No congestion/rhinnorhea. Mouth/Throat: No trismus Neck: No stridor.   Cardiovascular: Normal rate, regular rhythm. Grossly normal heart sounds.  Good peripheral circulation. Respiratory: Normal respiratory effort.  No retractions. Lungs CTAB and moving good air Gastrointestinal: Soft nontender Musculoskeletal: No lower extremity edema   Neurologic:   No gross focal neurologic deficits are appreciated. Skin:  Skin is warm, dry and intact. No rash noted. Psychiatric: Severe dementia  ____________________________________________   DIFFERENTIAL includes but not limited to  Dehydration, stroke, urinary tract infection, sepsis ____________________________________________   LABS (all labs ordered are listed, but only abnormal results are displayed)  Labs Reviewed  URINE CULTURE - Abnormal; Notable for the following components:      Result Value   Culture >=100,000 COLONIES/mL ESCHERICHIA COLI (*)    Organism ID, Bacteria ESCHERICHIA COLI (*)    All other components within normal limits  MRSA PCR SCREENING - Abnormal; Notable for the following components:   MRSA by PCR POSITIVE (*)    All other components within normal limits  URINALYSIS, COMPLETE (UACMP) WITH MICROSCOPIC - Abnormal; Notable for the following components:   Color, Urine YELLOW (*)    APPearance TURBID (*)    Protein, ur 100 (*)    Leukocytes, UA LARGE (*)    Bacteria, UA MANY (*)    Squamous Epithelial / LPF 0-5 (*)    All other components within normal limits  COMPREHENSIVE METABOLIC PANEL - Abnormal; Notable for the following components:   Chloride 99 (*)    Glucose, Bld 131 (*)    BUN 30 (*)    Creatinine, Ser 1.61 (*)    Calcium 8.1 (*)    Albumin 3.2 (*)    ALT 9 (*)    GFR calc non Af Amer 26 (*)     GFR calc Af Amer 31 (*)    All other components within normal limits  CBC WITH DIFFERENTIAL/PLATELET - Abnormal; Notable for the following components:   RBC 3.61 (*)    Hemoglobin 10.0 (*)    HCT 30.9 (*)    RDW 15.2 (*)    Neutro Abs 7.1 (*)    Monocytes Absolute 1.1 (*)    All other components within normal limits  BASIC METABOLIC PANEL - Abnormal; Notable for the following components:   Glucose, Bld 209 (*)    BUN 29 (*)    Creatinine, Ser 1.37 (*)    Calcium 8.0 (*)    GFR calc non Af Amer 32 (*)    GFR calc Af Amer 37 (*)    All other components within normal limits  CBC - Abnormal; Notable for the following components:   RBC 3.70 (*)    Hemoglobin 10.3 (*)    HCT 31.8 (*)    RDW 15.3 (*)    All other components within normal limits  CBC - Abnormal; Notable for the following components:   RBC 3.44 (*)    Hemoglobin 9.4 (*)    HCT 29.0 (*)    RDW 15.1 (*)    All other components within normal limits  BASIC METABOLIC PANEL - Abnormal; Notable for the following components:   Glucose, Bld 157 (*)    BUN 37 (*)    Creatinine, Ser 1.27 (*)    Calcium 8.4 (*)    GFR calc non Af Amer 35 (*)    GFR calc Af Amer 41 (*)    All other components within normal limits  TROPONIN I    Lab work reviewed by me consistent with urinary tract infection along with dehydration __________________________________________  EKG   ____________________________________________  RADIOLOGY  Head CT reviewed by me with no acute disease Chest x-ray reviewed by me with no acute disease ____________________________________________   PROCEDURES  Procedure(s) performed: no  Procedures  Critical Care performed: no  Observation: no ____________________________________________   INITIAL IMPRESSION / ASSESSMENT AND PLAN / ED COURSE  Pertinent labs & imaging results  that were available during my care of the patient were reviewed by me and considered in my medical decision making (see  chart for details).  The patient arrives hemodynamically stable although quite confused.  Differential on a confused 84 year old woman with dementia is extremely broad but is concerning for metabolic versus infectious etiology.  Labs and urinalysis are pending.  The patient's urinalysis is consistent with infection.  I reviewed previous cultures and she has no resistant organisms.  We will cover her with ceftriaxone now.  The patient will require inpatient admission for further IV antibiotics, fluid resuscitation, and treatment of her acute delirium.  I discussed with the hospitalist who has graciously agreed to admit the patient to their service.      ____________________________________________   FINAL CLINICAL IMPRESSION(S) / ED DIAGNOSES  Final diagnoses:  Acute kidney injury (HCC)  Delirium  Dehydration      NEW MEDICATIONS STARTED DURING THIS VISIT:  Discharge Medication List as of 09/03/2017 10:06 AM    START taking these medications   Details  cephALEXin (KEFLEX) 250 MG capsule Take 1 capsule (250 mg total) by mouth 2 (two) times daily for 2 days., Starting Tue 09/03/2017, Until Thu 09/05/2017, Print         Note:  This document was prepared using Dragon voice recognition software and may include unintentional dictation errors.     Merrily Brittle, MD 09/04/17 2253

## 2017-09-01 NOTE — Progress Notes (Signed)
Pharmacy Antibiotic Note  Daisy CabotMolene T [name] is a 84 y.o. female admitted on 09/01/2017 with altered mental status and has a past medical history of enterococcus UTI. Pharmacy has been consulted for Unasyn dosing.  Plan: Unasyn 3g IV q12h   Height: 5\' 1"  (154.9 cm) Weight: 160 lb (72.6 kg) IBW/kg (Calculated) : 47.8  No data recorded.  Recent Labs  Lab 09/01/17 1340  WBC 9.4  CREATININE 1.61*    Estimated Creatinine Clearance: 19.9 mL/min (A) (by C-G formula based on SCr of 1.61 mg/dL (H)).    Allergies  Allergen Reactions  . Aricept [Donepezil Hcl] Other (See Comments)    Reaction:  Nightmares   . Codeine Other (See Comments)    Reaction:  Unknown   . Ketek [Telithromycin] Other (See Comments)    Reaction:  Mood changes     Antimicrobials this admission: 4/14 Ampicillin >> once 4/14 Unasyn >>   Dose adjustments this admission:   Microbiology results: 4/14 UCx: sent    Thank you for allowing pharmacy to be a part of this patient's care.  Cleopatra CedarStephanie Demi Trieu, PharmD Pharmacy Resident 09/01/2017 3:43 PM

## 2017-09-01 NOTE — Progress Notes (Signed)
Patient admitted to room 152. Patient is alert to self. Daughter at bedside. Patient and family educated about call light system and safety precautions. Patient placed on a low bed.

## 2017-09-01 NOTE — ED Triage Notes (Signed)
"  not acting herself" x 1 week per family. Strong urine smell per ems. From the [facility] -

## 2017-09-01 NOTE — Progress Notes (Addendum)
Purpose of Encounter UTI, acute encephalopathy.  Patient has dementia. CODE STATUS discussion  Parties in Attendance Daughter at bedside with patient  Patients Decisional capacity Patient has dementia and is unable to participate in discussion.  Patient is being admitted to the hospital for UTI with acute encephalopathy.  She does seem to have progressive dementia.  Has had good appetite.  Ambulates with a walker at baseline.  Daughter at bedside is involved in the discussion and giving history.  Old records have been reviewed. She does have advanced directives for DO NOT RESUSCITATE and DO NOT INTUBATE in place.  We discussed regarding patient's dementia and its course.  Goals of care determination Patient does have progressively worsening dementia.  Is a resident of local nursing home. Good prognosis with the acute illness.  Long-term prognosis to be determined by her progression of dementia in the future.  DO NOT RESUSCITATE/DO NOT INTUBATE  Time spent-17 minutes

## 2017-09-01 NOTE — H&P (Signed)
[employer] - [place] [facility]   PATIENT NAME: Daisy [name]    MR#:  [id number]  DATE OF BIRTH:  [birth year]  DATE OF ADMISSION:  09/01/2017  PRIMARY CARE PHYSICIAN: Kandyce RudBabaoff, Marcus, MD   REQUESTING/REFERRING PHYSICIAN: Dr. Lamont Snowballifenbark  CHIEF COMPLAINT:   Chief Complaint  Patient presents with  . Altered Mental Status    HISTORY OF PRESENT ILLNESS:  Daisy ArbourMolene Devins  is a 84 y.o. female with a known history of hypertension, dementia, resident of a local nursing home presents due to weakness, worsening confusion, poor appetite.  Patient has been found to have a UTI and acute kidney injury.  Recently she was given 3 days of higher dose of Lasix due to lower extremity swelling.  This has improved.  Patient has dementia and her confusion has been worse over the last week.  Unable to contribute to history.  Old records reviewed and discussed with daughter at bedside. Last urine culture had enterococcus.  PAST MEDICAL HISTORY:   Past Medical History:  Diagnosis Date  . Acute renal failure (HCC)   . Anemia   . Asthma   . Cerebral artery occlusion    with cerebral infarction  . Dementia   . Depression 06/05/2013  . Edema   . Essential hypertension, benign 06/05/2013  . GERD (gastroesophageal reflux disease) 06/05/2013  . Heart attack (HCC)   . Osteoarthritis   . Other and unspecified hyperlipidemia 06/05/2013  . Paronychia, toe   . Recurrent UTI   . Stroke [facility](HCC)     PAST SURGICAL HISTORY:   Past Surgical History:  Procedure Laterality Date  . CARDIAC SURGERY     Left BBB  . FEMUR IM NAIL Right 10/01/2015   Procedure: INTRAMEDULLARY (IM) NAIL FEMORAL;  Surgeon: Juanell FairlyKevin Krasinski, MD;  Location: [facility] ORS;  Service: Orthopedics;  Laterality: Right;  . KNEE SURGERY Right     SOCIAL HISTORY:   Social History   Tobacco Use  . Smoking status: Former Games developermoker  . Smokeless tobacco: Never Used  . Tobacco comment: quit 50 + years  Substance Use Topics  . Alcohol  use: Yes    Alcohol/week: 0.0 oz    Comment: occasionally    FAMILY HISTORY:   Family History  Problem Relation Age of Onset  . Prostate cancer Son   . Cancer Mother        Oral  . Colon cancer Sister   . Kidney disease Neg Hx   . Bladder Cancer Neg Hx     DRUG ALLERGIES:   Allergies  Allergen Reactions  . Aricept [Donepezil Hcl] Other (See Comments)    Reaction:  Nightmares   . Codeine Other (See Comments)    Reaction:  Unknown   . Ketek [Telithromycin] Other (See Comments)    Reaction:  Mood changes     REVIEW OF SYSTEMS:   Review of Systems  Unable to perform ROS: Dementia    MEDICATIONS AT HOME:   Prior to Admission medications   Medication Sig Start Date End Date Taking? Authorizing Provider  acetaminophen (TYLENOL) 325 MG tablet Take 650 mg by mouth every 4 (four) hours as needed for fever.   Yes [provider]  aspirin EC 81 MG tablet Take 81 mg by mouth daily.   Yes [provider]  carvedilol (COREG) 12.5 MG tablet Take 1 tablet (12.5 mg total) by mouth 2 (two) times daily with a meal. 06/09/13  Yes Rodolph Bonghompson, Daniel V, MD  cetirizine (ZYRTEC) 10 MG tablet  Take 10 mg by mouth daily.   Yes [provider]  citalopram (CELEXA) 20 MG tablet Take 30 mg by mouth daily.    Yes [provider]  furosemide (LASIX) 20 MG tablet Take 20 mg by mouth daily.    Yes [provider]  guaiFENesin (ROBITUSSIN) 100 MG/5ML SOLN Take 5 mLs (100 mg total) by mouth every 4 (four) hours as needed for cough or to loosen phlegm. 02/27/17  Yes Auburn Bilberry, MD  ipratropium-albuterol (DUONEB) 0.5-2.5 (3) MG/3ML SOLN Take 3 mLs by nebulization 3 (three) times daily. 02/27/17  Yes Auburn Bilberry, MD  loperamide (IMODIUM A-D) 2 MG tablet Take 4 mg by mouth as needed for diarrhea or loose stools. Do not exceed 8 doses in 24 hours.   Yes [provider]  losartan (COZAAR) 100 MG tablet Take 1 tablet (100 mg total) by mouth  daily. Patient taking differently: Take 50 mg by mouth daily.  02/27/17 02/27/18 Yes Auburn Bilberry, MD  magnesium hydroxide (MILK OF MAGNESIA) 400 MG/5ML suspension Take 30 mLs by mouth daily as needed for mild constipation.   Yes [provider]  montelukast (SINGULAIR) 10 MG tablet Take 10 mg by mouth at bedtime.   Yes [provider]  moxifloxacin (VIGAMOX) 0.5 % ophthalmic solution Place 1 drop into the left eye 3 (three) times daily.   Yes [provider]  nystatin cream (MYCOSTATIN) Apply 1 application topically 2 (two) times daily.   Yes [provider]  omeprazole (PRILOSEC) 20 MG capsule Take 20 mg by mouth daily before breakfast.    Yes [provider]  senna-docusate (SENOKOT-S) 8.6-50 MG tablet Take 1-2 tablets by mouth daily.   Yes [provider]  simvastatin (ZOCOR) 20 MG tablet Take 20 mg by mouth daily.    Yes [provider]  vitamin B-12 (CYANOCOBALAMIN) 1000 MCG tablet Take 1,000 mcg by mouth daily.   Yes [provider]     VITAL SIGNS:  Blood pressure (!) 123/47, pulse 72, resp. rate (!) 22, height 5\' 1"  (1.549 m), weight 72.6 kg (160 lb), SpO2 96 %.  PHYSICAL EXAMINATION:  Physical Exam  GENERAL:  84 y.o.-year-old patient lying in the bed with no acute distress.  EYES: Pupils equal, round, reactive to light and accommodation. No scleral icterus. Extraocular muscles intact.  HEENT: Head atraumatic, normocephalic. Oropharynx and nasopharynx clear. No oropharyngeal erythema, moist oral mucosa  NECK:  Supple, no jugular venous distention. No thyroid enlargement, no tenderness.  LUNGS: Normal breath sounds bilaterally, no wheezing, rales, rhonchi. No use of accessory muscles of respiration.  CARDIOVASCULAR: S1, S2 normal. No murmurs, rubs, or gallops.  ABDOMEN: Soft, nontender, nondistended. Bowel sounds present. No organomegaly or mass.  EXTREMITIES: No pedal edema, cyanosis, or clubbing. + 2 pedal &  radial pulses b/l.   NEUROLOGIC: Cranial nerves II through XII are intact.  Motor strength 4/5 in upper and lower extremities PSYCHIATRIC: The patient is alert and awake.  Pleasantly confused SKIN: No obvious rash, lesion, or ulcer.   LABORATORY PANEL:   CBC Recent Labs  Lab 09/01/17 1340  WBC 9.4  HGB 10.0*  HCT 30.9*  PLT 191   ------------------------------------------------------------------------------------------------------------------  Chemistries  Recent Labs  Lab 09/01/17 1340  NA 136  K 3.7  CL 99*  CO2 30  GLUCOSE 131*  BUN 30*  CREATININE 1.61*  CALCIUM 8.1*  AST 17  ALT 9*  ALKPHOS 61  BILITOT 0.8   ------------------------------------------------------------------------------------------------------------------  Cardiac Enzymes Recent Labs  Lab 09/01/17 1340  TROPONINI <0.03   ------------------------------------------------------------------------------------------------------------------  RADIOLOGY:  Ct Head Wo Contrast  Result Date: 09/01/2017 CLINICAL DATA:  Dementia, new confusion and personality change for 1 week, history hypertension, MI, stroke, renal failure EXAM: CT HEAD WITHOUT CONTRAST TECHNIQUE: Contiguous axial images were obtained from the base of the skull through the vertex without intravenous contrast. Sagittal and coronal MPR images reconstructed from axial data set. COMPARISON:  09/30/2015 FINDINGS: Brain: Generalized atrophy. Normal ventricular morphology. No midline shift or mass effect. Small vessel chronic ischemic changes of deep cerebral white matter. Small lacunar infarct RIGHT thalamus. No intracranial hemorrhage, mass lesion, evidence of acute infarction, or extra-axial fluid collection. Vascular: Atherosclerotic calcifications of internal carotid and vertebral arteries at skull base Skull: Normal appearance Sinuses/Orbits: Partial opacification of LEFT mastoid air cells. Remaining visualized paranasal sinuses and RIGHT  mastoid air cells clear. Other: N/A IMPRESSION: Atrophy with small vessel chronic ischemic changes of deep cerebral white matter. Old lacunar infarct RIGHT thalamus. No acute intracranial abnormalities. Electronically Signed   By: Ulyses Southward M.D.   On: 09/01/2017 14:16   Dg Chest Port 1 View  Result Date: 09/01/2017 CLINICAL DATA:  Shortness of breath, altered mental status, history of stroke, heart attack, former smoker, asthma, dementia EXAM: PORTABLE CHEST 1 VIEW COMPARISON:  Portable exam 1317 hours compared to 03/10/2017 FINDINGS: Enlargement of cardiac silhouette. Calcified tortuous aorta. Mediastinal contours and pulmonary vascularity normal. Lungs clear. No pleural effusion or pneumothorax. Bones demineralized. IMPRESSION: Enlargement of cardiac silhouette without acute abnormalities. Electronically Signed   By: Ulyses Southward M.D.   On: 09/01/2017 13:49     IMPRESSION AND PLAN:   * UTI with acute encephalopathy over baseline dementia Patient's last urine culture has enterococcus.  Will start her on Unasyn.  Monitor for inpatient delirium.  Wait for urine cultures.  *AK I due to increased Lasix and poor oral intake.  Hold Lasix and start IV fluids.  Repeat labs in the morning.  Monitor input and output.  *Mild COPD exacerbation.  Continue scheduled DuoNeb.  Start IV Solu-Medrol.  *Hypertension.  Continue patient's home medications.  DVT prophylaxis with Lovenox  All the records are reviewed and case discussed with ED provider. Management plans discussed with the patient, family and they are in agreement.  CODE STATUS: DNR  TOTAL TIME TAKING CARE OF THIS PATIENT: 40 minutes.   Orie Fisherman M.D on 09/01/2017 at 3:43 PM  Between 7am to 6pm - Pager - [phone]  After 6pm go to [email or web address] - password [email or web address] [facility]  [facility] [place] [facility]  Office  [phone]  CC: Primary care physician; Kandyce Rud, MD  Note: This dictation was prepared with Dragon dictation  along with smaller phrase technology. Any transcriptional errors that result from this process are unintentional.

## 2017-09-02 LAB — MRSA PCR SCREENING: MRSA BY PCR: POSITIVE — AB

## 2017-09-02 LAB — CBC
HCT: 31.8 % — ABNORMAL LOW (ref 35.0–47.0)
Hemoglobin: 10.3 g/dL — ABNORMAL LOW (ref 12.0–16.0)
MCH: 27.7 pg (ref 26.0–34.0)
MCHC: 32.3 g/dL (ref 32.0–36.0)
MCV: 85.8 fL (ref 80.0–100.0)
PLATELETS: 181 10*3/uL (ref 150–440)
RBC: 3.7 MIL/uL — ABNORMAL LOW (ref 3.80–5.20)
RDW: 15.3 % — ABNORMAL HIGH (ref 11.5–14.5)
WBC: 6.5 10*3/uL (ref 3.6–11.0)

## 2017-09-02 LAB — BASIC METABOLIC PANEL
Anion gap: 7 (ref 5–15)
BUN: 29 mg/dL — ABNORMAL HIGH (ref 6–20)
CHLORIDE: 101 mmol/L (ref 101–111)
CO2: 28 mmol/L (ref 22–32)
CREATININE: 1.37 mg/dL — AB (ref 0.44–1.00)
Calcium: 8 mg/dL — ABNORMAL LOW (ref 8.9–10.3)
GFR calc Af Amer: 37 mL/min — ABNORMAL LOW (ref >60)
GFR calc non Af Amer: 32 mL/min — ABNORMAL LOW (ref >60)
Glucose, Bld: 209 mg/dL — ABNORMAL HIGH (ref 65–99)
Potassium: 4.6 mmol/L (ref 3.5–5.1)
Sodium: 136 mmol/L (ref 135–145)

## 2017-09-02 MED ORDER — IPRATROPIUM-ALBUTEROL 0.5-2.5 (3) MG/3ML IN SOLN
3.0000 mL | Freq: Three times a day (TID) | RESPIRATORY_TRACT | Status: DC
  Administered 2017-09-02 – 2017-09-03 (×4): 3 mL via RESPIRATORY_TRACT
  Filled 2017-09-02 (×4): qty 3

## 2017-09-02 NOTE — Progress Notes (Signed)
Family Meeting Note  Advance Directive:yes  Today a meeting took place with the Patient and Daughter at bedside.  Patient is unable to participate due ZO:XWRUEAto:Lacked capacity Dementia   The following clinical team members were present during this meeting:MD  The following were discussed:Patient's diagnosis:   84 y.o. female with a known history of hypertension, dementia, resident of [facility] presents due to weakness, worsening confusion, poor appetite due to UTI   Patient's progosis: < 12 months and Goals for treatment: DNR  Additional follow-up to be provided: Palliative care to follow while at the facility ([facility]) and transition to Hospice if and when she qualifies  Time spent during discussion:20 minutes  Delfino LovettVipul Johnice Riebe, MD

## 2017-09-02 NOTE — Progress Notes (Signed)
PT Cancellation Note  Patient Details Name: Daisy [name] MRN: [id number] DOB: 01-26-1922   Cancelled Treatment:    Reason Eval/Treat Not Completed: Patient declined, no reason specified. Order received.  Chart reviewed.  Patient very lethargic and repeats that she is cold.  Partial hx obtained from daughter and pt declined physical evaluation, stating, " I wish you would just leave me alone".  Pt's daughter requested that PT return later today when pt is not as lethargic.  PT will re-attempt later if time allows.   Glenetta HewSarah Lakota Schweppe, PT, DPT 09/02/2017, 8:39 AM

## 2017-09-02 NOTE — Progress Notes (Signed)
PT Cancellation Note  Patient Details Name: Daisy [name] MRN: [id number] DOB: [birth year]   Cancelled Treatment:    Reason Eval/Treat Not Completed: Patient declined, no reason specified.  Order received.  Chart reviewed.  Pt's daughter states that pt is finally resting and that she would not like for her to be disturbed.  PT informed pt's daughter that evaluation will be a priority tomorrow if not done today.  Will re-attempt if time allows.   Glenetta HewSarah Ichelle Harral, PT, DPT 09/02/2017, 11:38 AM

## 2017-09-02 NOTE — Progress Notes (Signed)
   [employer] - [place] [facility]   PATIENT NAME: Daisy ArbourMolene Whelchel    MR#:  [id number]  DATE OF BIRTH:  [birth year]  SUBJECTIVE:  CHIEF COMPLAINT:   Chief Complaint  Patient presents with  . Altered Mental Status  slowly improving, daughter at bedside REVIEW OF SYSTEMS:  Review of Systems  Unable to perform ROS: Dementia    DRUG ALLERGIES:   Allergies  Allergen Reactions  . Aricept [Donepezil Hcl] Other (See Comments)    Reaction:  Nightmares   . Codeine Other (See Comments)    Reaction:  Unknown   . Ketek [Telithromycin] Other (See Comments)    Reaction:  Mood changes    VITALS:  Blood pressure (!) 165/75, pulse 63, temperature (!) 97.4 F (36.3 C), resp. rate 20, height 5\' 1"  (1.549 m), weight 72.6 kg (160 lb 0.9 oz), SpO2 96 %. PHYSICAL EXAMINATION:  Physical Exam  HENT:  Head: Normocephalic and atraumatic.  Eyes: Pupils are equal, round, and reactive to light. Conjunctivae and EOM are normal.  Neck: Normal range of motion. Neck supple. No tracheal deviation present. No thyromegaly present.  Cardiovascular: Normal rate, regular rhythm and normal heart sounds.  Pulmonary/Chest: Effort normal and breath sounds normal. No respiratory distress. She has no wheezes. She exhibits no tenderness.  Abdominal: Soft. Bowel sounds are normal. She exhibits no distension. There is no tenderness.  Musculoskeletal: Normal range of motion.  Neurological: She is alert. No cranial nerve deficit.  Skin: Skin is warm and dry. No rash noted.   LABORATORY PANEL:  Female CBC Recent Labs  Lab 09/02/17 0427  WBC 6.5  HGB 10.3*  HCT 31.8*  PLT 181   ------------------------------------------------------------------------------------------------------------------ Chemistries  Recent Labs  Lab 09/01/17 1340 09/02/17 0427  NA 136 136  K 3.7 4.6  CL 99* 101  CO2 30 28  GLUCOSE 131* 209*  BUN 30* 29*  CREATININE 1.61* 1.37*  CALCIUM 8.1* 8.0*  AST 17  --   ALT  9*  --   ALKPHOS 61  --   BILITOT 0.8  --    RADIOLOGY:  No results found. ASSESSMENT AND PLAN:   * acute metabolic encephalopathy due to UTI with underlying dementia - continue Unasyn for now  * E.coli UTI: based on urine c/s, await sensitivties - continue unasyn  *AKI due to increased Lasix and poor oral intake.  Hold Lasix and start IV fluids.  Repeat labs in the morning.  Monitor input and output.  *Mild COPD exacerbation.  Continue scheduled DuoNeb.  continue IV Solu-Medrol.  *Hypertension.  Continue patient's home medications.       All the records are reviewed and case discussed with Care Management/Social Worker. Management plans discussed with the patient, family (daughter at bedside) and they are in agreement.  CODE STATUS: DNR  TOTAL TIME TAKING CARE OF THIS PATIENT: 35 minutes.   More than 50% of the time was spent in counseling/coordination of care: YES  POSSIBLE D/C IN 1-2 DAYS, DEPENDING ON CLINICAL CONDITION. Back to her facility (long term resident)   Delfino LovettVipul Johnthomas Lader M.D on 09/02/2017 at 6:02 PM  Between 7am to 6pm - Pager - [phone]  After 6pm go to [email or web address] - Social research officer, governmentpassword [email or web address]  [facility] [place] [facility]  Office  [phone]  CC: Primary care physician; Kandyce RudBabaoff, Marcus, MD  Note: This dictation was prepared with Dragon dictation along with smaller phrase technology. Any transcriptional errors that result from this process are unintentional.

## 2017-09-03 LAB — CBC
HCT: 29 % — ABNORMAL LOW (ref 35.0–47.0)
HEMOGLOBIN: 9.4 g/dL — AB (ref 12.0–16.0)
MCH: 27.5 pg (ref 26.0–34.0)
MCHC: 32.5 g/dL (ref 32.0–36.0)
MCV: 84.4 fL (ref 80.0–100.0)
PLATELETS: 215 10*3/uL (ref 150–440)
RBC: 3.44 MIL/uL — AB (ref 3.80–5.20)
RDW: 15.1 % — ABNORMAL HIGH (ref 11.5–14.5)
WBC: 9.2 10*3/uL (ref 3.6–11.0)

## 2017-09-03 LAB — BASIC METABOLIC PANEL
Anion gap: 7 (ref 5–15)
BUN: 37 mg/dL — ABNORMAL HIGH (ref 6–20)
CHLORIDE: 104 mmol/L (ref 101–111)
CO2: 25 mmol/L (ref 22–32)
CREATININE: 1.27 mg/dL — AB (ref 0.44–1.00)
Calcium: 8.4 mg/dL — ABNORMAL LOW (ref 8.9–10.3)
GFR, EST AFRICAN AMERICAN: 41 mL/min — AB (ref >60)
GFR, EST NON AFRICAN AMERICAN: 35 mL/min — AB (ref >60)
Glucose, Bld: 157 mg/dL — ABNORMAL HIGH (ref 65–99)
POTASSIUM: 4.1 mmol/L (ref 3.5–5.1)
SODIUM: 136 mmol/L (ref 135–145)

## 2017-09-03 LAB — URINE CULTURE: Culture: >=100000 — AB

## 2017-09-03 MED ORDER — CEPHALEXIN 250 MG PO CAPS: 250.0000 mg | ORAL_CAPSULE | Freq: Two times a day (BID) | ORAL | 0 refills | Status: AC

## 2017-09-03 NOTE — Progress Notes (Signed)
New referral for out patient Palliative to [facility]. Plan is for discharge today. Dayna BarkerKaren Robertson RN, BSN, [facility] [place], hospital liaison [phone]

## 2017-09-03 NOTE — Progress Notes (Signed)
Discharge instructions reviewed with patient and family. IV not in place at this time. Prescriptions given to family. [facility] Awaiting w/c transport to private vehicle at this time.

## 2017-09-03 NOTE — Care Management Note (Signed)
Case Management Note  Patient Details  Name: Daisy [name] MRN: [id number] Date of Birth: 04-24-1922  Subjective/Objective:  Discharging today                  Action/Plan: Spoke with Keda at [facility].They use [facility] for home health. PCP is Dr. Larwance Sachs.  Spoke with daughter by phone. Discussed discharge plan. She is agreeable to home health.She states patient is limited in what she can do at the facility. She mainly transfers to ad from the bath room with 2 person assistance.  Patient will need RN and PT. Referral to Selena Batten at [facility] for RN and PT. Daughter will transport patient by car.   Expected Discharge Date:  09/03/17               Expected Discharge Plan:  Home w Home Health Services  In-House Referral:  Hospice / Palliative Care  Discharge planning Services  CM Consult  Post Acute Care Choice:  Home Health Choice offered to:  Patient  DME Arranged:    DME Agency:     HH Arranged:  RN, PT HH Agency:  [facility]  Status of Service:  Completed, signed off  If discussed at Long Length of Stay Meetings, dates discussed:    Additional Comments:  Marily Memos, RN 09/03/2017, 10:17 AM

## 2017-09-03 NOTE — Evaluation (Signed)
Physical Therapy Evaluation Patient Details Name: Daisy [name] MRN: [id number] DOB: Sep 29, 1921 Today's Date: 09/03/2017   History of Present Illness  Patient is a 84 year old female admitted with AMS and a UTI.  PMH includes stroke, HLD, MI, GERD, edema, depression, asthma, anemia, acute renal failure and paronchyia of the toe.  Clinical Impression  Pt is a 84 year old female who loves in an ALF and receives assistance with transfers and walking with a RW at baseline.  She is in bed upon PT arrival and reports no pain.  Pt presents with intermittent disorientation but is able to follow single step commands consistently and multi-step commands intermittently with need for some redirection.  She presented as deconditioned overall with weakness in UE/LE.  Pt required min-mod A for bed mobility with difficulty scooting to EOB.  She was able to perform STS with min A for avoidance of posterior lean and with bed elevated.  Pt performed standing pivot transfer to recliner with RW and PT provided mod A for avoidance of posterior lean.  Pt demonstrated knowledge of proper use of RW.  Pt will continue to benefit from skilled PT with focus on strength, balance and tolerance to activity.    Follow Up Recommendations Home health PT    Equipment Recommendations  None recommended by PT    Recommendations for Other Services       Precautions / Restrictions Precautions Precautions: Fall Restrictions Weight Bearing Restrictions: No      Mobility  Bed Mobility Overal bed mobility: Needs Assistance Bed Mobility: Sidelying to Sit   Sidelying to sit: Mod assist       General bed mobility comments: Pt able to assist with bringing LE's over EOB and pushing to upright position.  Needs assistance with scooting to EOB.  Transfers Overall transfer level: Needs assistance Equipment used: Rolling walker (2 wheeled) Transfers: Sit to/from [employer] CorporationStand;Stand Pivot Transfers Sit to Stand: Min assist Stand pivot  transfers: Mod assist       General transfer comment: Pt able to initiate and complete STS without physical assistance.  Tends to lean posteriorly which PT corrected.  Pt required mod A to perform pivot transfer for avoidance of posterior LOB.  Able to manage RW without VC's.  Ambulation/Gait Ambulation/Gait assistance: (Did not perform.)              Stairs            Wheelchair Mobility    Modified Rankin (Stroke Patients Only)       Balance Overall balance assessment: Needs assistance Sitting-balance support: Bilateral upper extremity supported;Feet supported   Sitting balance - Comments: Tends to lean posteriorly can correct with VC's.   Standing balance support: Bilateral upper extremity supported                                 Pertinent Vitals/Pain Pain Assessment: No/denies pain    Home Living Family/patient expects to be discharged to:: Assisted living               Home Equipment: Walker - 2 wheels      Prior Function Level of Independence: Needs assistance   Gait / Transfers Assistance Needed: Ambulates to bathroom with assistance, recieves help with all transfers  ADL's / Homemaking Assistance Needed: Receives assistance with bathing and dressing        Hand Dominance        Extremity/Trunk Assessment  Upper Extremity Assessment Upper Extremity Assessment: Generalized weakness    Lower Extremity Assessment Lower Extremity Assessment: Generalized weakness    Cervical / Trunk Assessment Cervical / Trunk Assessment: Kyphotic  Communication   Communication: HOH  Cognition Arousal/Alertness: Awake/alert Behavior During Therapy: WFL for tasks assessed/performed Overall Cognitive Status: History of cognitive impairments - at baseline                                 General Comments: Pt has a dx of dementia.      General Comments      Exercises     Assessment/Plan    PT Assessment Patient  needs continued PT services  PT Problem List Decreased strength;Decreased mobility;Decreased balance;Decreased activity tolerance       PT Treatment Interventions DME instruction;Therapeutic activities;Therapeutic exercise;Cognitive remediation;Patient/family education;Gait training;Stair training;Balance training;Functional mobility training;Neuromuscular re-education    PT Goals (Current goals can be found in the Care Plan section)  Acute Rehab PT Goals Patient Stated Goal: to return to ALF PT Goal Formulation: With patient/family Time For Goal Achievement: 09/10/17 Potential to Achieve Goals: Good    Frequency Min 2X/week   Barriers to discharge        Co-evaluation               AM-PAC PT "6 Clicks" Daily Activity  Outcome Measure Difficulty turning over in bed (including adjusting bedclothes, sheets and blankets)?: A Little Difficulty moving from lying on back to sitting on the side of the bed? : A Little Difficulty sitting down on and standing up from a chair with arms (e.g., wheelchair, bedside commode, etc,.)?: A Little Help needed moving to and from a bed to chair (including a wheelchair)?: A Lot Help needed walking in hospital room?: A Lot Help needed climbing 3-5 steps with a railing? : A Lot 6 Click Score: 15    End of Session Equipment Utilized During Treatment: Gait belt Activity Tolerance: Patient limited by fatigue Patient left: in chair;with chair alarm set;with family/visitor present;with call bell/phone within reach   PT Visit Diagnosis: Unsteadiness on feet (R26.81);Muscle weakness (generalized) (M62.81)    Time: 0981-1914 PT Time Calculation (min) (ACUTE ONLY): 20 min   Charges:   PT Evaluation $PT Eval Low Complexity: 1 Low     PT G Codes:   PT G-Codes **NOT FOR INPATIENT CLASS** Functional Assessment Tool Used: AM-PAC 6 Clicks Basic Mobility    Glenetta Hew, PT, DPT   Glenetta Hew 09/03/2017, 10:06 AM

## 2017-09-03 NOTE — Discharge Instructions (Signed)

## 2017-09-03 NOTE — Clinical Social Work Note (Signed)
CSW spoke with [facility] and the administrator Amalia HaileyDustin, said patient can return back today once discharge summary and FL2 has been completed and faxed to the ALF.  Ervin KnackEric R. Jeanine Caven, MSW, Theresia MajorsLCSWA [phone]  09/03/2017 9:44 AM

## 2017-09-08 NOTE — Discharge Summary (Signed)
[facility] [place] [facility]   PATIENT NAME: Daisy [name]    MR#:  [id number]  DATE OF BIRTH:  May 07, 1922  DATE OF ADMISSION:  09/01/2017   ADMITTING PHYSICIAN: Milagros LollSrikar Sudini, MD  DATE OF DISCHARGE: 09/03/2017 10:45 AM  PRIMARY CARE PHYSICIAN: Kandyce RudBabaoff, Marcus, MD   ADMISSION DIAGNOSIS:  Dehydration [E86.0] Delirium [R41.0] Acute kidney injury (HCC) [N17.9] DISCHARGE DIAGNOSIS:  Active Problems:   UTI (urinary tract infection)  SECONDARY DIAGNOSIS:   Past Medical History:  Diagnosis Date  . Acute renal failure (HCC)   . Anemia   . Asthma   . Cerebral artery occlusion    with cerebral infarction  . Dementia   . Depression 06/05/2013  . Edema   . Essential hypertension, benign 06/05/2013  . GERD (gastroesophageal reflux disease) 06/05/2013  . Heart attack (HCC)   . Osteoarthritis   . Other and unspecified hyperlipidemia 06/05/2013  . Paronychia, toe   . Recurrent UTI   . Stroke [facility](HCC)    HOSPITAL COURSE:   *acute metabolic encephalopathy due to UTI with underlying dementia - [facility]  * E.coli UTI: Pansensitive. Finish 2 more days of Keflex  *AKI due to increased Lasix and poor oral intake: held lasix while in the Hospital. Now close to baseline at D/C  *Mild COPD exacerbation: improved with steroids and nebs  *Hypertension: controlled on patient's home medications. DISCHARGE CONDITIONS:  stable CONSULTS OBTAINED:   DRUG ALLERGIES:   Allergies  Allergen Reactions  . Aricept [Donepezil Hcl] Other (See Comments)    Reaction:  Nightmares   . Codeine Other (See Comments)    Reaction:  Unknown   . Ketek [Telithromycin] Other (See Comments)    Reaction:  Mood changes    DISCHARGE MEDICATIONS:   Allergies as of 09/03/2017      Reactions   Aricept [donepezil Hcl] Other (See Comments)   Reaction:  Nightmares    Codeine Other (See Comments)   Reaction:  Unknown    Ketek  [telithromycin] Other (See Comments)   Reaction:  Mood changes       Medication List    TAKE these medications   acetaminophen 325 MG tablet Commonly known as:  TYLENOL Take 650 mg by mouth every 4 (four) hours as needed for fever.   aspirin EC 81 MG tablet Take 81 mg by mouth daily.   carvedilol 12.5 MG tablet Commonly known as:  COREG Take 1 tablet (12.5 mg total) by mouth 2 (two) times daily with a meal.   cetirizine 10 MG tablet Commonly known as:  ZYRTEC Take 10 mg by mouth daily.   citalopram 20 MG tablet Commonly known as:  CELEXA Take 30 mg by mouth daily.   furosemide 20 MG tablet Commonly known as:  LASIX Take 20 mg by mouth daily.   guaiFENesin 100 MG/5ML Soln Commonly known as:  ROBITUSSIN Take 5 mLs (100 mg total) by mouth every 4 (four) hours as needed for cough or to loosen phlegm.   ipratropium-albuterol 0.5-2.5 (3) MG/3ML Soln Commonly known as:  DUONEB Take 3 mLs by nebulization 3 (three) times daily.   loperamide 2 MG tablet Commonly known as:  IMODIUM A-D Take 4 mg by mouth as needed for diarrhea or loose stools. Do not exceed 8 doses in 24 hours.   losartan 100 MG tablet Commonly known as:  COZAAR Take 1 tablet (100 mg total) by mouth daily. What changed:  how much to take   magnesium hydroxide 400 MG/5ML suspension Commonly known as:  MILK OF MAGNESIA Take 30 mLs by mouth daily as needed for mild constipation.   montelukast 10 MG tablet Commonly known as:  SINGULAIR Take 10 mg by mouth at bedtime.   moxifloxacin 0.5 % ophthalmic solution Commonly known as:  VIGAMOX Place 1 drop into the left eye 3 (three) times daily.   nystatin cream Commonly known as:  MYCOSTATIN Apply 1 application topically 2 (two) times daily.   omeprazole 20 MG capsule Commonly known as:  PRILOSEC Take 20 mg by mouth daily before breakfast.   senna-docusate 8.6-50 MG tablet Commonly known as:  Senokot-S Take 1-2 tablets by mouth daily.   simvastatin 20  MG tablet Commonly known as:  ZOCOR Take 20 mg by mouth daily.   vitamin B-12 1000 MCG tablet Commonly known as:  CYANOCOBALAMIN Take 1,000 mcg by mouth daily.     ASK your doctor about these medications   cephALEXin 250 MG capsule Commonly known as:  KEFLEX Take 1 capsule (250 mg total) by mouth 2 (two) times daily for 2 days. Ask about: Should I take this medication?        DISCHARGE INSTRUCTIONS:   DIET:  Regular diet DISCHARGE CONDITION:  Good ACTIVITY:  Activity as tolerated OXYGEN:  Home Oxygen: No.  Oxygen Delivery: room air DISCHARGE LOCATION:  Home ([facility]) - Palliative to follow at [facility]     If you experience worsening of your admission symptoms, develop shortness of breath, life threatening emergency, suicidal or homicidal thoughts you must seek medical attention immediately by calling 911 or calling your MD immediately  if symptoms less severe.  You Must read complete instructions/literature along with all the possible adverse reactions/side effects for all the Medicines you take and that have been prescribed to you. Take any new Medicines after you have completely understood and accpet all the possible adverse reactions/side effects.   Please note  You were cared for by a hospitalist during your hospital stay. If you have any questions about your discharge medications or the care you received while you were in the hospital after you are discharged, you can call the unit and asked to speak with the hospitalist on call if the hospitalist that took care of you is not available. Once you are discharged, your primary care physician will handle any further medical issues. Please note that NO REFILLS for any discharge medications will be authorized once you are discharged, as it is imperative that you return to your primary care physician (or establish a relationship with a primary care physician if you do not have one) for your aftercare needs so that they  can reassess your need for medications and monitor your lab values.    On the day of Discharge:  VITAL SIGNS:  Blood pressure (!) 182/80, pulse 77, temperature 97.8 F (36.6 C), temperature source Oral, resp. rate 18, height 5\' 1"  (1.549 m), weight 77.6 kg (171 lb), SpO2 97 %. PHYSICAL EXAMINATION:  GENERAL:  84 y.o.-year-old patient lying in the bed with no acute distress.  EYES: Pupils equal, round, reactive to light and accommodation. No scleral icterus. Extraocular muscles intact.  HEENT: Head atraumatic, normocephalic. Oropharynx and nasopharynx clear.  NECK:  Supple, no jugular venous distention. No thyroid enlargement, no tenderness.  LUNGS: Normal breath sounds bilaterally, no wheezing, rales,rhonchi or crepitation. No use of accessory muscles of respiration.  CARDIOVASCULAR: S1, S2 normal. No murmurs, rubs, or  gallops.  ABDOMEN: Soft, non-tender, non-distended. Bowel sounds present. No organomegaly or mass.  EXTREMITIES: No pedal edema, cyanosis, or clubbing.  NEUROLOGIC: Cranial nerves II through XII are intact. Muscle strength 5/5 in all extremities. Sensation intact. Gait not checked.  PSYCHIATRIC: The patient is alert and oriented x 3.  SKIN: No obvious rash, lesion, or ulcer.  DATA REVIEW:   CBC Recent Labs  Lab 09/03/17 0441  WBC 9.2  HGB 9.4*  HCT 29.0*  PLT 215    Chemistries  Recent Labs  Lab 09/01/17 1340  09/03/17 0441  NA 136   < > 136  K 3.7   < > 4.1  CL 99*   < > 104  CO2 30   < > 25  GLUCOSE 131*   < > 157*  BUN 30*   < > 37*  CREATININE 1.61*   < > 1.27*  CALCIUM 8.1*   < > 8.4*  AST 17  --   --   ALT 9*  --   --   ALKPHOS 61  --   --   BILITOT 0.8  --   --    < > = values in this interval not displayed.     Follow-up Information    Kandyce Rud, MD. Schedule an appointment as soon as possible for a visit in 6 days.   Specialty:  Family Medicine Why:  Physician's office will call family w/ Appt Contact information: [address] [facility] and Internal Medicine [place] [address] [phone]           Management plans discussed with the patient, family and they are in agreement.  CODE STATUS: Prior   TOTAL TIME TAKING CARE OF THIS PATIENT: 45 minutes.    Delfino Lovett M.D on 09/08/2017 at 9:36 AM  Between 7am to 6pm - Pager - [phone]  After 6pm go to [email or web address] - Social research officer, government  [facility] [place] [facility]  Office  [phone]  CC: Primary care physician; Kandyce Rud, MD   Note: This dictation was prepared with Dragon dictation along with smaller phrase technology. Any transcriptional errors that result from this process are unintentional.

## 2017-12-16 ENCOUNTER — Ambulatory Visit (INDEPENDENT_AMBULATORY_CARE_PROVIDER_SITE_OTHER): Payer: Medicare Other | Admitting: Podiatry

## 2017-12-16 ENCOUNTER — Encounter: Payer: Self-pay | Admitting: Podiatry

## 2017-12-16 DIAGNOSIS — B351 Tinea unguium: Secondary | ICD-10-CM | POA: Diagnosis not present

## 2017-12-16 DIAGNOSIS — M79674 Pain in right toe(s): Secondary | ICD-10-CM

## 2017-12-16 DIAGNOSIS — M79675 Pain in left toe(s): Secondary | ICD-10-CM

## 2017-12-16 NOTE — Progress Notes (Signed)
Complaint:  Visit Type: Patient presents  to my office for  preventative foot care services. She is accompanied by her daughter and someone from the home at which she stays.  Her daughter tells me she is here for her toenail pedicure.  This patient is [id number] with dementia and sleeps during the whole treatment .  She is treated by myself while she sits in her wheelchair. She presents with compression socks on both feet. The patient presents for preventative foot care services. No changes to ROS  Podiatric Exam: Vascular: deferred since wearing socks. Sensorium: deferred Nail Exam: Pt has thick disfigured discolored nails with subungual debris noted bilateral entire nail hallux through fifth toenails Ulcer Exam: There is no evidence of ulcer or pre-ulcerative changes or infection. Orthopedic Exam: deferred. Skin: No Porokeratosis. No infection or ulcers  Diagnosis:  Onychomycosis, , Pain in right toe, pain in left toes  Treatment & Plan Procedures and Treatment: Consent by patient was obtained for treatment procedures.   Debridement of mycotic and hypertrophic toenails, 1 through 5 bilateral and clearing of subungual debris. No ulceration, no infection noted.  Return Visit-Office Procedure: Patient instructed to return to the office for a follow up visit 3 months for continued evaluation and treatment.    Helane GuntherGregory Terrianna Holsclaw DPM

## 2018-08-11 ENCOUNTER — Ambulatory Visit: Payer: PPO | Admitting: Urology

## 2018-09-24 ENCOUNTER — Ambulatory Visit: Payer: Medicare Other | Admitting: Urology

## 2018-11-03 ENCOUNTER — Ambulatory Visit: Payer: Medicare Other | Admitting: Urology

## 2019-06-19 DIAGNOSIS — R300 Dysuria: Secondary | ICD-10-CM | POA: Diagnosis not present

## 2019-06-19 DIAGNOSIS — R8290 Unspecified abnormal findings in urine: Secondary | ICD-10-CM | POA: Diagnosis not present

## 2019-06-19 DIAGNOSIS — N390 Urinary tract infection, site not specified: Secondary | ICD-10-CM | POA: Diagnosis not present

## 2019-07-21 DIAGNOSIS — Z79899 Other long term (current) drug therapy: Secondary | ICD-10-CM | POA: Diagnosis not present

## 2019-07-21 DIAGNOSIS — E7800 Pure hypercholesterolemia, unspecified: Secondary | ICD-10-CM | POA: Diagnosis not present

## 2019-07-21 DIAGNOSIS — Z1331 Encounter for screening for depression: Secondary | ICD-10-CM | POA: Diagnosis not present

## 2019-07-21 DIAGNOSIS — Z0000 Encounter for general adult medical examination without abnormal findings: Secondary | ICD-10-CM | POA: Diagnosis not present

## 2019-07-27 ENCOUNTER — Encounter: Payer: Self-pay | Admitting: Emergency Medicine

## 2019-07-27 ENCOUNTER — Other Ambulatory Visit: Payer: Self-pay

## 2019-07-27 ENCOUNTER — Emergency Department
Admission: EM | Admit: 2019-07-27 | Discharge: 2019-07-28 | Disposition: A | Discharge status: Home / Self Care | Payer: PPO | Attending: Emergency Medicine | Admitting: Emergency Medicine

## 2019-07-27 DIAGNOSIS — R531 Weakness: Secondary | ICD-10-CM

## 2019-07-27 DIAGNOSIS — Z79899 Other long term (current) drug therapy: Secondary | ICD-10-CM | POA: Insufficient documentation

## 2019-07-27 DIAGNOSIS — N183 Chronic kidney disease, stage 3 unspecified: Secondary | ICD-10-CM | POA: Diagnosis not present

## 2019-07-27 DIAGNOSIS — F0390 Unspecified dementia without behavioral disturbance: Secondary | ICD-10-CM | POA: Insufficient documentation

## 2019-07-27 DIAGNOSIS — Z87891 Personal history of nicotine dependence: Secondary | ICD-10-CM | POA: Diagnosis not present

## 2019-07-27 DIAGNOSIS — J449 Chronic obstructive pulmonary disease, unspecified: Secondary | ICD-10-CM | POA: Diagnosis not present

## 2019-07-27 DIAGNOSIS — D649 Anemia, unspecified: Secondary | ICD-10-CM | POA: Insufficient documentation

## 2019-07-27 DIAGNOSIS — Z8673 Personal history of transient ischemic attack (TIA), and cerebral infarction without residual deficits: Secondary | ICD-10-CM | POA: Insufficient documentation

## 2019-07-27 DIAGNOSIS — I129 Hypertensive chronic kidney disease with stage 1 through stage 4 chronic kidney disease, or unspecified chronic kidney disease: Secondary | ICD-10-CM | POA: Insufficient documentation

## 2019-07-27 DIAGNOSIS — Z7982 Long term (current) use of aspirin: Secondary | ICD-10-CM | POA: Diagnosis not present

## 2019-07-27 DIAGNOSIS — R7989 Other specified abnormal findings of blood chemistry: Secondary | ICD-10-CM | POA: Diagnosis present

## 2019-07-27 LAB — COMPREHENSIVE METABOLIC PANEL
ALT: 10 U/L (ref 0–44)
AST: 13 U/L — ABNORMAL LOW (ref 15–41)
Albumin: 3.5 g/dL (ref 3.5–5.0)
Alkaline Phosphatase: 51 U/L (ref 38–126)
Anion gap: 8 (ref 5–15)
BUN: 20 mg/dL (ref 8–23)
CO2: 28 mmol/L (ref 22–32)
Calcium: 8.5 mg/dL — ABNORMAL LOW (ref 8.9–10.3)
Chloride: 102 mmol/L (ref 98–111)
Creatinine, Ser: 1.48 mg/dL — ABNORMAL HIGH (ref 0.44–1.00)
GFR calc Af Amer: 35 mL/min — ABNORMAL LOW (ref >60)
GFR calc non Af Amer: 30 mL/min — ABNORMAL LOW (ref >60)
Glucose, Bld: 112 mg/dL — ABNORMAL HIGH (ref 70–99)
Potassium: 4.2 mmol/L (ref 3.5–5.1)
Sodium: 138 mmol/L (ref 135–145)
Total Bilirubin: 0.5 mg/dL (ref 0.3–1.2)
Total Protein: 7.2 g/dL (ref 6.5–8.1)

## 2019-07-27 LAB — CBC
HCT: 25.6 % — ABNORMAL LOW (ref 36.0–46.0)
Hemoglobin: 7.1 g/dL — ABNORMAL LOW (ref 12.0–15.0)
MCH: 20.8 pg — ABNORMAL LOW (ref 26.0–34.0)
MCHC: 27.7 g/dL — ABNORMAL LOW (ref 30.0–36.0)
MCV: 75.1 fL — ABNORMAL LOW (ref 80.0–100.0)
Platelets: 269 10*3/uL (ref 150–400)
RBC: 3.41 MIL/uL — ABNORMAL LOW (ref 3.87–5.11)
RDW: 17.8 % — ABNORMAL HIGH (ref 11.5–15.5)
WBC: 8.3 10*3/uL (ref 4.0–10.5)
nRBC: 0 % (ref 0.0–0.2)

## 2019-07-27 LAB — PREPARE RBC (CROSSMATCH)

## 2019-07-27 MED ORDER — SODIUM CHLORIDE 0.9 % IV SOLN
10.0000 mL/h | Freq: Once | INTRAVENOUS | Status: AC
  Administered 2019-07-27: 10 mL/h via INTRAVENOUS

## 2019-07-27 MED ORDER — IPRATROPIUM-ALBUTEROL 0.5-2.5 (3) MG/3ML IN SOLN
RESPIRATORY_TRACT | Status: DC
Start: 2019-07-27 — End: 2019-07-28
  Filled 2019-07-27: qty 3

## 2019-07-27 MED ORDER — IPRATROPIUM-ALBUTEROL 0.5-2.5 (3) MG/3ML IN SOLN
3.0000 mL | Freq: Once | RESPIRATORY_TRACT | Status: AC
  Administered 2019-07-27: 3 mL via RESPIRATORY_TRACT

## 2019-07-27 NOTE — ED Notes (Signed)
Pt cleaned of incontinent urine, purewick placed. Pt resting comfortably. Daughter bedside.

## 2019-07-27 NOTE — ED Notes (Signed)
Pt dressed in gown and yellow fall socks placed on pt. Pt given pillow and blanket. Family signed consent for blood in chart. Pt resting comfortably

## 2019-07-27 NOTE — ED Triage Notes (Signed)
Patient's daughter Nena Jordan,  Reports patient was sent by PCP for hemoglobin of 6.9 taken on 3/2. She states patient lives with her and she has noticed some generalized weakness over the last 2 weeks. Denies noticing dark, tarry stool or blood in stool. No complaints of pain per daughter.

## 2019-07-27 NOTE — ED Notes (Signed)
MD at bedside. 

## 2019-07-27 NOTE — ED Provider Notes (Signed)
[facility] Emergency Department Provider Note   ____________________________________________   First MD Initiated Contact with Patient 07/27/19 1742     (approximate)  I have reviewed the triage vital signs and the nursing notes.   HISTORY  Chief Complaint Abnormal Lab    HPI Daisy [name] is a 86 y.o. female with past medical history of dementia, stroke, hypertension, CAD, and CKD who presents to the ED for abnormal lab work.  Majority of history is provided by patient's daughter given patient's history of dementia.  Patient's daughter states they got a call earlier today that patient's blood work showed a hemoglobin of 6.9 and she needed to come to the ER for a blood transfusion.  She has not had any problems with anemia in the past and neither her nor her daughter have noticed any bleeding recently.  Daughter also denies any dark tarry stools.  Patient states she has been feeling fine with no fevers, cough, chest pain, or shortness of breath.  Daughter does state that the patient has been weaker than usual over about the past 2 weeks, has had a harder time supporting herself on her walker.        Past Medical History:  Diagnosis Date  . Acute renal failure (HCC)   . Anemia   . Asthma   . Cerebral artery occlusion    with cerebral infarction  . Dementia (HCC)   . Depression 06/05/2013  . Edema   . Essential hypertension, benign 06/05/2013  . GERD (gastroesophageal reflux disease) 06/05/2013  . Heart attack (HCC)   . Osteoarthritis   . Other and unspecified hyperlipidemia 06/05/2013  . Paronychia, toe   . Recurrent UTI   . Stroke [facility])     Patient Active Problem List   Diagnosis Date Noted  . UTI (urinary tract infection) 09/01/2017  . Diarrhea 03/12/2017  . Sepsis (HCC) 02/23/2017  . Rectal bleed 11/11/2015  . Dementia (HCC) 11/11/2015  . COPD (chronic obstructive pulmonary disease) (HCC) 11/11/2015  . Dyslipidemia 11/11/2015  .  Infection of urinary tract 10/04/2015  . Escherichia coli (E. coli) infection 10/04/2015  . CKD (chronic kidney disease), stage III 10/04/2015  . Hyperglycemia 10/04/2015  . MRSA carrier 10/04/2015  . Leukocytosis 10/04/2015  . Thrombocytopenia (HCC) 10/04/2015  . Closed right hip fracture (HCC) 09/30/2015  . Recurrent UTI 11/12/2014  . Microscopic hematuria 11/12/2014  . Atrophic vaginitis 11/12/2014  . CVA (cerebral infarction) 06/05/2013  . Hypotension, unspecified 06/05/2013  . Other and unspecified hyperlipidemia 06/05/2013  . Acute renal failure (HCC) 06/05/2013  . Leukocytosis, unspecified 06/05/2013  . Essential hypertension, benign 06/05/2013  . Depression 06/05/2013  . GERD (gastroesophageal reflux disease) 06/05/2013    Past Surgical History:  Procedure Laterality Date  . CARDIAC SURGERY     Left BBB  . FEMUR IM NAIL Right 10/01/2015   Procedure: INTRAMEDULLARY (IM) NAIL FEMORAL;  Surgeon: Juanell Fairly, MD;  Location: [facility] ORS;  Service: Orthopedics;  Laterality: Right;  . KNEE SURGERY Right     Prior to Admission medications   Medication Sig Start Date End Date Taking? Authorizing Provider  acetaminophen (TYLENOL) 325 MG tablet Take 650 mg by mouth every 4 (four) hours as needed for fever.   Yes [provider]  aspirin EC 81 MG tablet Take 81 mg by mouth daily.   Yes [provider]  carvedilol (COREG) 12.5 MG tablet Take 1 tablet (12.5 mg total) by mouth 2 (two) times daily with a meal. 06/09/13  Yes Rodolph Bong, MD  cetirizine (ZYRTEC) 10 MG tablet Take 10 mg by mouth daily.   Yes [provider]  citalopram (CELEXA) 10 MG tablet Take 30 mg by mouth daily. 06/26/19  Yes [provider]  furosemide (LASIX) 20 MG tablet Take 20 mg by mouth daily.    Yes [provider]  ipratropium-albuterol (DUONEB) 0.5-2.5 (3) MG/3ML SOLN Take 3 mLs by nebulization 3 (three) times daily. 02/27/17  Yes Auburn Bilberry, MD    losartan (COZAAR) 100 MG tablet Take 1 tablet (100 mg total) by mouth daily. Patient taking differently: Take 50 mg by mouth daily.  02/27/17 07/27/19 Yes Auburn Bilberry, MD  montelukast (SINGULAIR) 10 MG tablet Take 10 mg by mouth at bedtime.   Yes [provider]  simvastatin (ZOCOR) 20 MG tablet Take 20 mg by mouth daily.    Yes [provider]  vitamin B-12 (CYANOCOBALAMIN) 1000 MCG tablet Take 1,000 mcg by mouth daily.   Yes [provider]  ferrous sulfate 325 (65 FE) MG EC tablet Take 1 tablet (325 mg total) by mouth 2 (two) times daily. 07/28/19 07/27/20  Chesley Noon, MD  vitamin C (ASCORBIC ACID) 250 MG tablet Take 1 tablet (250 mg total) by mouth daily. 07/28/19 10/26/19  Chesley Noon, MD    Allergies Aricept Palma Holter hcl], Codeine, Donepezil, and Telithromycin  Family History  Problem Relation Age of Onset  . Prostate cancer Son   . Cancer Mother        Oral  . Colon cancer Sister   . Kidney disease Neg Hx   . Bladder Cancer Neg Hx     Social History Social History   Tobacco Use  . Smoking status: Former Games developer  . Smokeless tobacco: Never Used  . Tobacco comment: quit 50 + years  Substance Use Topics  . Alcohol use: Yes    Alcohol/week: 0.0 standard drinks    Comment: occasionally  . Drug use: No    Review of Systems  Constitutional: No fever/chills.  Positive for generalized weakness. Eyes: No visual changes. ENT: No sore throat. Cardiovascular: Denies chest pain. Respiratory: Denies shortness of breath. Gastrointestinal: No abdominal pain.  No nausea, no vomiting.  No diarrhea.  No constipation. Genitourinary: Negative for dysuria. Musculoskeletal: Negative for back pain. Skin: Negative for rash. Neurological: Negative for headaches, focal weakness or numbness.  ____________________________________________   PHYSICAL EXAM:  VITAL SIGNS: ED Triage Vitals  Enc Vitals Group     BP 07/27/19 1658 (!) 145/74     Pulse Rate  07/27/19 1658 69     Resp 07/27/19 1658 16     Temp 07/27/19 1658 98.7 F (37.1 C)     Temp Source 07/27/19 1658 Oral     SpO2 07/27/19 1658 96 %     Weight 07/27/19 1700 169 lb 12.1 oz (77 kg)     Height 07/27/19 1700 5' (1.524 m)     Head Circumference --      Peak Flow --      Pain Score --      Pain Loc --      Pain Edu? --      Excl. in GC? --     Constitutional: Awake and alert. Eyes: Conjunctivae are normal. Head: Atraumatic. Nose: No congestion/rhinnorhea. Mouth/Throat: Mucous membranes are moist. Neck: Normal ROM Cardiovascular: Normal rate, regular rhythm. Grossly normal heart sounds. Respiratory: Normal respiratory effort.  No retractions. Lungs CTAB. Gastrointestinal: Soft and nontender. No distention.  Light brown  guaiac negative stool on rectal exam. Genitourinary: deferred Musculoskeletal: No lower extremity tenderness nor edema. Neurologic:  Normal speech and language. No gross focal neurologic deficits are appreciated. Skin:  Skin is warm, dry and intact. No rash noted. Psychiatric: Mood and affect are normal. Speech and behavior are normal.  ____________________________________________   LABS (all labs ordered are listed, but only abnormal results are displayed)  Labs Reviewed  COMPREHENSIVE METABOLIC PANEL - Abnormal; Notable for the following components:      Result Value   Glucose, Bld 112 (*)    Creatinine, Ser 1.48 (*)    Calcium 8.5 (*)    AST 13 (*)    GFR calc non Af Amer 30 (*)    GFR calc Af Amer 35 (*)    All other components within normal limits  CBC - Abnormal; Notable for the following components:   RBC 3.41 (*)    Hemoglobin 7.1 (*)    HCT 25.6 (*)    MCV 75.1 (*)    MCH 20.8 (*)    MCHC 27.7 (*)    RDW 17.8 (*)    All other components within normal limits  URINALYSIS, COMPLETE (UACMP) WITH MICROSCOPIC  POC OCCULT BLOOD, ED  TYPE AND SCREEN  PREPARE RBC (CROSSMATCH)    PROCEDURES  Procedure(s) performed (including  Critical Care):  Procedures   ____________________________________________   INITIAL IMPRESSION / ASSESSMENT AND PLAN / ED COURSE       86 year old female with history of dementia, CAD, COPD, and stroke presents to the ED for low hemoglobin noted on routine outpatient lab work.  Patient has some worsening generalized weakness over the past 2 weeks, but is otherwise asymptomatic with no obvious bleeding.  Rectal exam is unremarkable with light brown guaiac negative stool.  Labs are consistent with a microcytic anemia, WBC and platelets are within normal limits.  Remainder of labs show patient's baseline renal function and are otherwise unremarkable.  Given no apparent acute bleeding, we will transfuse with 1 unit PRBCs and then patient will be appropriate for discharge home with close PCP follow-up.  We also plan for referral to hematology and to start patient on supplemental iron.  Patient tolerated transfusion without difficulty.  Patient's daughter requested that we check UA given her generalized weakness and history of UTI.  Patient turned over to oncoming provider pending UA results, plan for discharge home with prescription for iron and vitamin C supplements.      ____________________________________________   FINAL CLINICAL IMPRESSION(S) / ED DIAGNOSES  Final diagnoses:  Anemia, unspecified type  Generalized weakness     ED Discharge Orders         Ordered    ferrous sulfate 325 (65 FE) MG EC tablet  2 times daily     07/28/19 0013    vitamin C (ASCORBIC ACID) 250 MG tablet  Daily     07/28/19 0013           Note:  This document was prepared using Dragon voice recognition software and may include unintentional dictation errors.   Blake Divine, MD 07/28/19 [phone]

## 2019-07-27 NOTE — ED Notes (Signed)
Family at bedside. 

## 2019-07-28 LAB — BPAM RBC
Blood Product Expiration Date: 202103132359
ISSUE DATE / TIME: 202103081947
Unit Type and Rh: 5100

## 2019-07-28 LAB — TYPE AND SCREEN
ABO/RH(D): A POS
Antibody Screen: NEGATIVE
Unit division: 0

## 2019-07-28 LAB — URINALYSIS, COMPLETE (UACMP) WITH MICROSCOPIC
Bilirubin Urine: NEGATIVE
Glucose, UA: NEGATIVE mg/dL
Hgb urine dipstick: NEGATIVE
Ketones, ur: NEGATIVE mg/dL
Nitrite: NEGATIVE
Protein, ur: NEGATIVE mg/dL
Specific Gravity, Urine: 1.02 (ref 1.005–1.030)
pH: 6 (ref 5.0–8.0)

## 2019-07-28 MED ORDER — FERROUS SULFATE 325 (65 FE) MG PO TBEC: 325.0000 mg | DELAYED_RELEASE_TABLET | Freq: Two times a day (BID) | ORAL | 3 refills | Status: AC

## 2019-07-28 MED ORDER — VITAMIN C 250 MG PO TABS: 250.0000 mg | ORAL_TABLET | Freq: Every day | ORAL | 2 refills | Status: AC

## 2019-07-28 NOTE — ED Provider Notes (Signed)
Pending UA to rule out UTI. Urine collected using a wick catheter. Specimen looks dirty but not necessarily infected. Patient is asymptomatic. Will send it for culture and hold off on abx for now. Discussed that plan with plan with patient and daughter who are both in agreement. Patient will be discharged home with instructions left by Dr. Larinda Buttery.   Don Perking, [place], MD 07/28/19 [phone]

## 2019-07-29 LAB — URINE CULTURE: Culture: >=100000 — AB

## 2019-07-30 NOTE — Progress Notes (Signed)
Discussed patient with:  Dr. Chesley Noon   Assessment/Plan:   Patient was recently seen in the ED for low hemoglobin and did not present with symptoms related to a UTI. Discussed urine cultures/urinalysis with MD and symptoms when patient was seen in the ED. It was noted to contact the patient about results and to start antibiotics given Citrobacter Freundii grew >100,000 CFU. Pharmacy was unable to speak directly with the patient about symptoms. However, given MD wanted to started Cefdinir 300 mg daily, pharmacy informed patient's daughter [name]) of urine culture and antibiotic. [name] was counseled on the administration of cefdinir. Prescription was called in to [facility] in [place].   Thank you for allowing pharmacy to be a part of this patient's care.  Cephus Shelling, PharmD Clinical Pharmacist

## 2019-08-05 ENCOUNTER — Other Ambulatory Visit: Payer: Self-pay

## 2019-08-05 ENCOUNTER — Inpatient Hospital Stay: Payer: PPO

## 2019-08-05 ENCOUNTER — Inpatient Hospital Stay: Payer: PPO | Attending: Internal Medicine | Admitting: Internal Medicine

## 2019-08-05 ENCOUNTER — Encounter: Payer: Self-pay | Admitting: Internal Medicine

## 2019-08-05 DIAGNOSIS — Z993 Dependence on wheelchair: Secondary | ICD-10-CM

## 2019-08-05 DIAGNOSIS — Z8042 Family history of malignant neoplasm of prostate: Secondary | ICD-10-CM

## 2019-08-05 DIAGNOSIS — Z808 Family history of malignant neoplasm of other organs or systems: Secondary | ICD-10-CM

## 2019-08-05 DIAGNOSIS — D631 Anemia in chronic kidney disease: Secondary | ICD-10-CM | POA: Diagnosis not present

## 2019-08-05 DIAGNOSIS — N1832 Chronic kidney disease, stage 3b: Secondary | ICD-10-CM

## 2019-08-05 DIAGNOSIS — Z87891 Personal history of nicotine dependence: Secondary | ICD-10-CM

## 2019-08-05 LAB — COMPREHENSIVE METABOLIC PANEL
ALT: 10 U/L (ref 0–44)
AST: 16 U/L (ref 15–41)
Albumin: 3.6 g/dL (ref 3.5–5.0)
Alkaline Phosphatase: 53 U/L (ref 38–126)
Anion gap: 9 (ref 5–15)
BUN: 16 mg/dL (ref 8–23)
CO2: 27 mmol/L (ref 22–32)
Calcium: 8.6 mg/dL — ABNORMAL LOW (ref 8.9–10.3)
Chloride: 102 mmol/L (ref 98–111)
Creatinine, Ser: 1.14 mg/dL — ABNORMAL HIGH (ref 0.44–1.00)
GFR calc Af Amer: 47 mL/min — ABNORMAL LOW (ref >60)
GFR calc non Af Amer: 41 mL/min — ABNORMAL LOW (ref >60)
Glucose, Bld: 110 mg/dL — ABNORMAL HIGH (ref 70–99)
Potassium: 4 mmol/L (ref 3.5–5.1)
Sodium: 138 mmol/L (ref 135–145)
Total Bilirubin: 0.6 mg/dL (ref 0.3–1.2)
Total Protein: 7 g/dL (ref 6.5–8.1)

## 2019-08-05 LAB — CBC WITH DIFFERENTIAL/PLATELET
Abs Immature Granulocytes: 0.02 10*3/uL (ref 0.00–0.07)
Basophils Absolute: 0.1 10*3/uL (ref 0.0–0.1)
Basophils Relative: 1 %
Eosinophils Absolute: 0.5 10*3/uL (ref 0.0–0.5)
Eosinophils Relative: 5 %
HCT: 31.2 % — ABNORMAL LOW (ref 36.0–46.0)
Hemoglobin: 9 g/dL — ABNORMAL LOW (ref 12.0–15.0)
Immature Granulocytes: 0 %
Lymphocytes Relative: 20 %
Lymphs Abs: 1.8 10*3/uL (ref 0.7–4.0)
MCH: 23 pg — ABNORMAL LOW (ref 26.0–34.0)
MCHC: 28.8 g/dL — ABNORMAL LOW (ref 30.0–36.0)
MCV: 79.8 fL — ABNORMAL LOW (ref 80.0–100.0)
Monocytes Absolute: 0.8 10*3/uL (ref 0.1–1.0)
Monocytes Relative: 9 %
Neutro Abs: 5.8 10*3/uL (ref 1.7–7.7)
Neutrophils Relative %: 65 %
Platelets: 237 10*3/uL (ref 150–400)
RBC: 3.91 MIL/uL (ref 3.87–5.11)
RDW: 21.7 % — ABNORMAL HIGH (ref 11.5–15.5)
WBC: 9 10*3/uL (ref 4.0–10.5)
nRBC: 0 % (ref 0.0–0.2)

## 2019-08-05 LAB — IRON AND TIBC
Iron: 128 ug/dL (ref 28–170)
Saturation Ratios: 35 % — ABNORMAL HIGH (ref 10.4–31.8)
TIBC: 363 ug/dL (ref 250–450)
UIBC: 235 ug/dL

## 2019-08-05 LAB — C-REACTIVE PROTEIN: CRP: 0.7 mg/dL (ref <1.0)

## 2019-08-05 LAB — LACTATE DEHYDROGENASE: LDH: 100 U/L (ref 98–192)

## 2019-08-05 LAB — FERRITIN: Ferritin: 16 ng/mL (ref 11–307)

## 2019-08-05 NOTE — Progress Notes (Signed)
[facility] CONSULT NOTE  Patient Care Team: Kandyce Rud, MD as PCP - General (Family Medicine) Kandyce Rud, MD (Family Medicine)  CHIEF COMPLAINTS/PURPOSE OF CONSULTATION:    HEMATOLOGY HISTORY  # SEVERE ANEMIA [ 6.1; normal white count /platelets [facility] PCP March 2021] EGD-[< 5 years]; colonoscopy-NEVER [Dr.Masoud]  # CKD stage III; Walker/  HISTORY OF PRESENTING ILLNESS: Patient a poor historian/accompanied by daughter.  Daisy [name] 86 y.o.  female has been referred to [place] for further evaluation/work-up for anemia.  Was recently evaluated by PCP-on routine blood work noted to have hemoglobin 6.1.  Patient was referred to the emergency room.  Patient was also noted to have UTI in the emergency room.  Patient had a blood transfusion in the emergency room and discharged home to follow-up with [place].   Blood in stools: None Change in bowel habits- None Blood in urine: None Difficulty swallowing: None Abnormal weight loss: None Iron supplementation: PO iron in ER/ 1 week Prior Blood transfusions: 1 PRBC transfusion.   Vaginal bleeding: None   Review of Systems  Unable to perform ROS: Age    MEDICAL HISTORY:  Past Medical History:  Diagnosis Date  . Acute renal failure (HCC)   . Anemia   . Asthma   . Cerebral artery occlusion    with cerebral infarction  . Dementia (HCC)   . Depression 06/05/2013  . Edema   . Essential hypertension, benign 06/05/2013  . GERD (gastroesophageal reflux disease) 06/05/2013  . Heart attack (HCC)   . Osteoarthritis   . Other and unspecified hyperlipidemia 06/05/2013  . Paronychia, toe   . Recurrent UTI   . Stroke [facility])     SURGICAL HISTORY: Past Surgical History:  Procedure Laterality Date  . CARDIAC SURGERY     Left BBB  . FEMUR IM NAIL Right 10/01/2015   Procedure: INTRAMEDULLARY (IM) NAIL FEMORAL;  Surgeon: Juanell Fairly, MD;  Location: [facility] ORS;  Service: Orthopedics;  Laterality: Right;  . KNEE  SURGERY Right     SOCIAL HISTORY: Social History   Socioeconomic History  . Marital status: Widowed    Spouse name: Not on file  . Number of children: Not on file  . Years of education: Not on file  . Highest education level: Not on file  Occupational History  . Not on file  Tobacco Use  . Smoking status: Former Games developer  . Smokeless tobacco: Never Used  . Tobacco comment: quit 50 + years  Substance and Sexual Activity  . Alcohol use: Yes    Alcohol/week: 0.0 standard drinks    Comment: occasionally  . Drug use: No  . Sexual activity: Never  Other Topics Concern  . 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    Social Determinants of Health   Financial Resource Strain:   . Difficulty of Paying Living Expenses:   Food Insecurity:   . Worried About Programme researcher, broadcasting/film/video in the Last Year:   . Barista in the Last Year:   Transportation Needs:   . Freight forwarder (Medical):   Marland Kitchen Lack of Transportation (Non-Medical):   Physical Activity:   . Days of Exercise per Week:   . Minutes of Exercise per Session:   Stress:   . Feeling of Stress :   Social Connections:   . Frequency of Communication with Friends and Family:   . Frequency of Social Gatherings  with Friends and Family:   . Attends Religious Services:   . Active Member of Clubs or Organizations:   . Attends Banker Meetings:   Marland Kitchen Marital Status:   Intimate Partner Violence:   . Fear of Current or Ex-Partner:   . Emotionally Abused:   Marland Kitchen Physically Abused:   . Sexually Abused:     FAMILY HISTORY: Family History  Problem Relation Age of Onset  . Prostate cancer Son   . Cancer Mother        Oral  . Colon cancer Sister   . Kidney disease Neg Hx   . Bladder Cancer Neg Hx     ALLERGIES:  is allergic to aricept [donepezil hcl]; codeine; donepezil; and telithromycin.  MEDICATIONS:  Current  Outpatient Medications  Medication Sig Dispense Refill  . aspirin EC 81 MG tablet Take 81 mg by mouth daily.    . carvedilol (COREG) 12.5 MG tablet Take 1 tablet (12.5 mg total) by mouth 2 (two) times daily with a meal. (Patient taking differently: Take 12.5 mg by mouth daily. ) 62 tablet 0  . cefdinir (OMNICEF) 300 MG capsule Take 1 capsule by mouth daily.    . cetirizine (ZYRTEC) 10 MG tablet Take 10 mg by mouth daily.    . citalopram (CELEXA) 10 MG tablet Take 30 mg by mouth daily.    . ferrous sulfate 325 (65 FE) MG EC tablet Take 1 tablet (325 mg total) by mouth 2 (two) times daily. 60 tablet 3  . furosemide (LASIX) 20 MG tablet Take 20 mg by mouth daily.     Marland Kitchen ipratropium-albuterol (DUONEB) 0.5-2.5 (3) MG/3ML SOLN Take 3 mLs by nebulization 3 (three) times daily. 360 mL 1  . losartan (COZAAR) 25 MG tablet Take 50 mg by mouth daily.    . montelukast (SINGULAIR) 10 MG tablet Take 10 mg by mouth at bedtime.    . simvastatin (ZOCOR) 20 MG tablet Take 20 mg by mouth daily.     . vitamin B-12 (CYANOCOBALAMIN) 1000 MCG tablet Take 1,000 mcg by mouth daily.    . vitamin C (ASCORBIC ACID) 250 MG tablet Take 1 tablet (250 mg total) by mouth daily. 30 tablet 2  . acetaminophen (TYLENOL) 325 MG tablet Take 650 mg by mouth every 4 (four) hours as needed for fever.     No current facility-administered medications for this visit.      PHYSICAL EXAMINATION:   Vitals:   08/05/19 1430  BP: (!) 115/59  Pulse: 67  Resp: 18  Temp: 98.1 F (36.7 C)   Filed Weights   08/05/19 1443  Weight: 177 lb (80.3 kg)    Physical Exam  Constitutional: She is oriented to person, place, and time and well-developed, well-nourished, and in no distress.  Elderly female patient in a wheelchair.  HENT:  Head: Normocephalic and atraumatic.  Mouth/Throat: Oropharynx is clear and moist. No oropharyngeal exudate.  Eyes: Pupils are equal, round, and reactive to light.  Cardiovascular: Normal rate and regular  rhythm.  Pulmonary/Chest: Effort normal and breath sounds normal. No respiratory distress. She has no wheezes.  Abdominal: Soft. Bowel sounds are normal. She exhibits no distension and no mass. There is no abdominal tenderness. There is no rebound and no guarding.  Musculoskeletal:        General: No tenderness or edema. Normal range of motion.     Cervical back: Normal range of motion and neck supple.  Neurological: She is alert and oriented to person, place,  and time.  Skin: Skin is warm.  Psychiatric: Affect normal.    LABORATORY DATA:  I have reviewed the data as listed Lab Results  Component Value Date   WBC 9.0 08/05/2019   HGB 9.0 (L) 08/05/2019   HCT 31.2 (L) 08/05/2019   MCV 79.8 (L) 08/05/2019   PLT 237 08/05/2019   Recent Labs    07/27/19 1715 08/05/19 1522  NA 138 138  K 4.2 4.0  CL 102 102  CO2 28 27  GLUCOSE 112* 110*  BUN 20 16  CREATININE 1.48* 1.14*  CALCIUM 8.5* 8.6*  GFRNONAA 30* 41*  GFRAA 35* 47*  PROT 7.2 7.0  ALBUMIN 3.5 3.6  AST 13* 16  ALT 10 10  ALKPHOS 51 53  BILITOT 0.5 0.6     No results found.  Anemia in stage 3b chronic kidney disease # Anemia-secondary chronic kidney disease stage III/iron deficiency [hb-6.1; March 2021].   Patient symptomatic with increasing fatigue shortness of breath on exertion.  Discussed etiology of anemia likely from chronic kidney disease.   #  Discussed that patient will need IV iron; also need erythropoietin injections if hemoglobin does not improve even after iron supplementation.  Discussed the potential acute infusion reactions with IV iron; which are quite rare.  Patient understands the risk; will proceed with infusions.  # CKD- III-overall stable.  #UTI-on antibiotic stable.  Thank you Dr.Babboff for allowing me to participate in the care of your pleasant patient. Please do not hesitate to contact me with questions or concerns in the interim.   DISPOSITION: # labs- cbc/cmp/MM panel; LDH; K/L  light chains; Iron studies/ferritin; CRP; EPO # venofer in weekly x2 # follow up in 2 weeks- MD; labs- cbc/possible venofer-dr.B   All questions were answered. The patient knows to call the clinic with any problems, questions or concerns.    Cammie Sickle, MD 08/06/2019 6:53 AM

## 2019-08-05 NOTE — Assessment & Plan Note (Addendum)
#   Anemia-secondary chronic kidney disease stage III/iron deficiency [hb-6.1; March 2021].   Patient symptomatic with increasing fatigue shortness of breath on exertion.  Discussed etiology of anemia likely from chronic kidney disease.   #  Discussed that patient will need IV iron; also need erythropoietin injections if hemoglobin does not improve even after iron supplementation.  Discussed the potential acute infusion reactions with IV iron; which are quite rare.  Patient understands the risk; will proceed with infusions.  # CKD- III-overall stable.  #UTI-on antibiotic stable.  Thank you Dr.Babboff for allowing me to participate in the care of your pleasant patient. Please do not hesitate to contact me with questions or concerns in the interim.   DISPOSITION: # labs- cbc/cmp/MM panel; LDH; K/L light chains; Iron studies/ferritin; CRP; EPO # venofer in weekly x2 # follow up in 2 weeks- MD; labs- cbc/possible venofer-dr.B

## 2019-08-06 LAB — KAPPA/LAMBDA LIGHT CHAINS
Kappa free light chain: 65.3 mg/L — ABNORMAL HIGH (ref 3.3–19.4)
Kappa, lambda light chain ratio: 1.44 (ref 0.26–1.65)
Lambda free light chains: 45.4 mg/L — ABNORMAL HIGH (ref 5.7–26.3)

## 2019-08-06 LAB — ERYTHROPOIETIN: Erythropoietin: 18.7 m[IU]/mL — ABNORMAL HIGH (ref 2.6–18.5)

## 2019-08-07 ENCOUNTER — Other Ambulatory Visit: Payer: Self-pay | Admitting: Internal Medicine

## 2019-08-10 LAB — MULTIPLE MYELOMA PANEL, SERUM
Albumin SerPl Elph-Mcnc: 3.3 g/dL (ref 2.9–4.4)
Albumin/Glob SerPl: 1.1 (ref 0.7–1.7)
Alpha 1: 0.2 g/dL (ref 0.0–0.4)
Alpha2 Glob SerPl Elph-Mcnc: 0.8 g/dL (ref 0.4–1.0)
B-Globulin SerPl Elph-Mcnc: 1.1 g/dL (ref 0.7–1.3)
Gamma Glob SerPl Elph-Mcnc: 1 g/dL (ref 0.4–1.8)
Globulin, Total: 3.2 g/dL (ref 2.2–3.9)
IgA: 441 mg/dL — ABNORMAL HIGH (ref 64–422)
IgG (Immunoglobin G), Serum: 1128 mg/dL (ref 586–1602)
IgM (Immunoglobulin M), Srm: 53 mg/dL (ref 26–217)
Total Protein ELP: 6.5 g/dL (ref 6.0–8.5)

## 2019-08-11 ENCOUNTER — Inpatient Hospital Stay: Payer: PPO

## 2019-08-11 VITALS — BP 121/77 | HR 65 | Temp 97.7°F | Resp 20

## 2019-08-11 DIAGNOSIS — N1832 Chronic kidney disease, stage 3b: Secondary | ICD-10-CM

## 2019-08-11 MED ORDER — SODIUM CHLORIDE 0.9 % IV SOLN: 200.0000 mg | Freq: Once | INTRAVENOUS | Status: DC

## 2019-08-11 MED ORDER — SODIUM CHLORIDE 0.9 % IV SOLN
Freq: Once | INTRAVENOUS | Status: AC
  Filled 2019-08-11: qty 250

## 2019-08-11 MED ORDER — IRON SUCROSE 20 MG/ML IV SOLN
200.0000 mg | Freq: Once | INTRAVENOUS | Status: AC
  Administered 2019-08-11: 200 mg via INTRAVENOUS
  Filled 2019-08-11: qty 10

## 2019-08-18 ENCOUNTER — Other Ambulatory Visit: Payer: Self-pay | Admitting: *Deleted

## 2019-08-18 DIAGNOSIS — N1832 Chronic kidney disease, stage 3b: Secondary | ICD-10-CM

## 2019-08-19 ENCOUNTER — Other Ambulatory Visit: Payer: Self-pay

## 2019-08-19 ENCOUNTER — Encounter: Payer: Self-pay | Admitting: Internal Medicine

## 2019-08-19 ENCOUNTER — Inpatient Hospital Stay: Payer: PPO

## 2019-08-19 ENCOUNTER — Inpatient Hospital Stay (HOSPITAL_BASED_OUTPATIENT_CLINIC_OR_DEPARTMENT_OTHER): Payer: PPO | Admitting: Internal Medicine

## 2019-08-19 DIAGNOSIS — D631 Anemia in chronic kidney disease: Secondary | ICD-10-CM

## 2019-08-19 DIAGNOSIS — N1832 Chronic kidney disease, stage 3b: Secondary | ICD-10-CM

## 2019-08-19 LAB — CBC WITH DIFFERENTIAL/PLATELET
Abs Immature Granulocytes: 0.02 10*3/uL (ref 0.00–0.07)
Basophils Absolute: 0.1 10*3/uL (ref 0.0–0.1)
Basophils Relative: 1 %
Eosinophils Absolute: 0.4 10*3/uL (ref 0.0–0.5)
Eosinophils Relative: 5 %
HCT: 33.2 % — ABNORMAL LOW (ref 36.0–46.0)
Hemoglobin: 9.7 g/dL — ABNORMAL LOW (ref 12.0–15.0)
Immature Granulocytes: 0 %
Lymphocytes Relative: 18 %
Lymphs Abs: 1.5 10*3/uL (ref 0.7–4.0)
MCH: 24.8 pg — ABNORMAL LOW (ref 26.0–34.0)
MCHC: 29.2 g/dL — ABNORMAL LOW (ref 30.0–36.0)
MCV: 84.9 fL (ref 80.0–100.0)
Monocytes Absolute: 0.8 10*3/uL (ref 0.1–1.0)
Monocytes Relative: 9 %
Neutro Abs: 5.9 10*3/uL (ref 1.7–7.7)
Neutrophils Relative %: 67 %
Platelets: 236 10*3/uL (ref 150–400)
RBC: 3.91 MIL/uL (ref 3.87–5.11)
RDW: 26.5 % — ABNORMAL HIGH (ref 11.5–15.5)
WBC: 8.6 10*3/uL (ref 4.0–10.5)
nRBC: 0 % (ref 0.0–0.2)

## 2019-08-19 MED ORDER — SODIUM CHLORIDE 0.9 % IV SOLN
Freq: Once | INTRAVENOUS | Status: AC
  Filled 2019-08-19: qty 250

## 2019-08-19 MED ORDER — SODIUM CHLORIDE 0.9 % IV SOLN: 200.0000 mg | Freq: Once | INTRAVENOUS | Status: DC

## 2019-08-19 MED ORDER — IRON SUCROSE 20 MG/ML IV SOLN
200.0000 mg | Freq: Once | INTRAVENOUS | Status: AC
  Administered 2019-08-19: 200 mg via INTRAVENOUS
  Filled 2019-08-19: qty 10

## 2019-08-19 NOTE — Progress Notes (Signed)
[place] NOTE  Patient Care Team: Derinda Late, MD as PCP - General (Family Medicine) Derinda Late, MD (Family Medicine)  CHIEF COMPLAINTS/PURPOSE OF CONSULTATION:    HEMATOLOGY HISTORY  # SEVERE ANEMIA [ 6.1; ER PRBC transfusion normal white count /platelets [facility] PCP March 2021] EGD-[< 5 years]; colonoscopy-NEVER [Dr.Masoud];   # CKD stage III; Walker/  HISTORY OF PRESENTING ILLNESS: Patient a poor historian/accompanied by daughter.  Daisy [name] 86 y.o.  female anemia/chronic kidney disease is here for follow-up.  Patient has had 1 Venofer infusion so far.  As per the daughter patient is more physically active.  Walking by herself.    Review of Systems  Unable to perform ROS: Age    MEDICAL HISTORY:  Past Medical History:  Diagnosis Date  . Acute renal failure ([place])   . Anemia   . Asthma   . Cerebral artery occlusion    with cerebral infarction  . Dementia ([place])   . Depression 06/05/2013  . Edema   . Essential hypertension, benign 06/05/2013  . GERD (gastroesophageal reflux disease) 06/05/2013  . Heart attack ([place])   . Osteoarthritis   . Other and unspecified hyperlipidemia 06/05/2013  . Paronychia, toe   . Recurrent UTI   . Stroke [facility])     SURGICAL HISTORY: Past Surgical History:  Procedure Laterality Date  . CARDIAC SURGERY     Left BBB  . FEMUR IM NAIL Right 10/01/2015   Procedure: INTRAMEDULLARY (IM) NAIL FEMORAL;  Surgeon: Thornton Park, MD;  Location: [facility] ORS;  Service: Orthopedics;  Laterality: Right;  . KNEE SURGERY Right     SOCIAL HISTORY: Social History   Socioeconomic History  . Marital status: Widowed    Spouse name: Not on file  . Number of children: Not on file  . Years of education: Not on file  . Highest education level: Not on file  Occupational History  . Not on file  Tobacco Use  . Smoking status: Former Research scientist (life sciences)  . Smokeless tobacco: Never Used  . Tobacco comment: quit 50 + years   Substance and Sexual Activity  . Alcohol use: Yes    Alcohol/week: 0.0 standard drinks    Comment: occasionally  . Drug use: No  . Sexual activity: Never  Other Topics Concern  . 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    Social Determinants of Health   Financial Resource Strain:   . Difficulty of Paying Living Expenses:   Food Insecurity:   . Worried About Charity fundraiser in the Last Year:   . Arboriculturist in the Last Year:   Transportation Needs:   . Film/video editor (Medical):   Marland Kitchen Lack of Transportation (Non-Medical):   Physical Activity:   . Days of Exercise per Week:   . Minutes of Exercise per Session:   Stress:   . Feeling of Stress :   Social Connections:   . Frequency of Communication with Friends and Family:   . Frequency of Social Gatherings with Friends and Family:   . Attends Religious Services:   . Active Member of Clubs or Organizations:   . Attends Archivist Meetings:   Marland Kitchen Marital Status:   Intimate Partner Violence:   . Fear of Current or Ex-Partner:   . Emotionally Abused:   Marland Kitchen Physically Abused:   . Sexually Abused:     FAMILY HISTORY: Family  History  Problem Relation Age of Onset  . Prostate cancer Son   . Cancer Mother        Oral  . Colon cancer Sister   . Kidney disease Neg Hx   . Bladder Cancer Neg Hx     ALLERGIES:  is allergic to aricept [donepezil hcl]; codeine; donepezil; and telithromycin.  MEDICATIONS:  Current Outpatient Medications  Medication Sig Dispense Refill  . acetaminophen (TYLENOL) 325 MG tablet Take 650 mg by mouth every 4 (four) hours as needed for fever.    Marland Kitchen aspirin EC 81 MG tablet Take 81 mg by mouth daily.    . carvedilol (COREG) 12.5 MG tablet Take 1 tablet (12.5 mg total) by mouth 2 (two) times daily with a meal. (Patient taking differently: Take 12.5 mg by mouth daily. ) 62 tablet 0  . citalopram  (CELEXA) 10 MG tablet Take 30 mg by mouth daily.    . ferrous sulfate 325 (65 FE) MG EC tablet Take 1 tablet (325 mg total) by mouth 2 (two) times daily. 60 tablet 3  . furosemide (LASIX) 20 MG tablet Take 20 mg by mouth daily.     Marland Kitchen ipratropium-albuterol (DUONEB) 0.5-2.5 (3) MG/3ML SOLN Take 3 mLs by nebulization 3 (three) times daily. 360 mL 1  . losartan (COZAAR) 25 MG tablet Take 50 mg by mouth daily.    . montelukast (SINGULAIR) 10 MG tablet Take 10 mg by mouth at bedtime.    . simvastatin (ZOCOR) 20 MG tablet Take 20 mg by mouth daily.     . vitamin B-12 (CYANOCOBALAMIN) 1000 MCG tablet Take 1,000 mcg by mouth daily.    . vitamin C (ASCORBIC ACID) 250 MG tablet Take 1 tablet (250 mg total) by mouth daily. 30 tablet 2   No current facility-administered medications for this visit.   Facility-Administered Medications Ordered in Other Visits  Medication Dose Route Frequency Provider Last Rate Last Admin  . 0.9 %  sodium chloride infusion   Intravenous Once Louretta Shorten R, MD      . iron sucrose (VENOFER) injection 200 mg  200 mg Intravenous Once Earna Coder, MD          PHYSICAL EXAMINATION:   Vitals:   08/19/19 1415  BP: 121/73  Pulse: 67  Temp: (!) 97.5 F (36.4 C)   Filed Weights   08/19/19 1420  Weight: 181 lb (82.1 kg)    Physical Exam  Constitutional: She is oriented to person, place, and time and well-developed, well-nourished, and in no distress.  Elderly female patient in a wheelchair.  HENT:  Head: Normocephalic and atraumatic.  Mouth/Throat: Oropharynx is clear and moist. No oropharyngeal exudate.  Eyes: Pupils are equal, round, and reactive to light.  Cardiovascular: Normal rate and regular rhythm.  Pulmonary/Chest: Effort normal and breath sounds normal. No respiratory distress. She has no wheezes.  Abdominal: Soft. Bowel sounds are normal. She exhibits no distension and no mass. There is no abdominal tenderness. There is no rebound and no  guarding.  Musculoskeletal:        General: No tenderness or edema. Normal range of motion.     Cervical back: Normal range of motion and neck supple.  Neurological: She is alert and oriented to person, place, and time.  Skin: Skin is warm.  Psychiatric: Affect normal.    LABORATORY DATA:  I have reviewed the data as listed Lab Results  Component Value Date   WBC 8.6 08/19/2019   HGB 9.7 (L) 08/19/2019  HCT 33.2 (L) 08/19/2019   MCV 84.9 08/19/2019   PLT 236 08/19/2019   Recent Labs    07/27/19 1715 08/05/19 1522  NA 138 138  K 4.2 4.0  CL 102 102  CO2 28 27  GLUCOSE 112* 110*  BUN 20 16  CREATININE 1.48* 1.14*  CALCIUM 8.5* 8.6*  GFRNONAA 30* 41*  GFRAA 35* 47*  PROT 7.2 7.0  ALBUMIN 3.5 3.6  AST 13* 16  ALT 10 10  ALKPHOS 51 53  BILITOT 0.5 0.6     No results found.  Anemia in stage 3b chronic kidney disease # Anemia-secondary chronic kidney disease stage III/iron deficiency [hb-6.1; March 2021].  Ferritin 16 saturation 35%.   S/p Venofer x1.  Hemoglobin today is 9.2.  Improved.  Proceed with infusion.  # CKD- III-overall stable.  DISPOSITION: # Venofer today # follow up in 4 weeks- MD; labs- cbc/bmp- possible venofer-dr.B   All questions were answered. The patient knows to call the clinic with any problems, questions or concerns.    Earna Coder, MD 08/19/2019 3:03 PM

## 2019-08-19 NOTE — Assessment & Plan Note (Addendum)
#   Anemia-secondary chronic kidney disease stage III/iron deficiency [hb-6.1; March 2021].  Ferritin 16 saturation 35%.   S/p Venofer x1.  Hemoglobin today is 9.2.  Improved.  Proceed with infusion.  # CKD- III-overall stable.  DISPOSITION: # Venofer today # follow up in 4 weeks- MD; labs- cbc/bmp- possible venofer-dr.B

## 2019-09-16 ENCOUNTER — Inpatient Hospital Stay: Payer: PPO | Attending: Internal Medicine

## 2019-09-16 ENCOUNTER — Encounter: Payer: Self-pay | Admitting: Internal Medicine

## 2019-09-16 ENCOUNTER — Inpatient Hospital Stay: Payer: PPO | Admitting: Internal Medicine

## 2019-09-16 ENCOUNTER — Inpatient Hospital Stay: Payer: PPO

## 2019-09-16 ENCOUNTER — Other Ambulatory Visit: Payer: Self-pay

## 2019-09-16 VITALS — BP 143/82 | HR 66 | Resp 19

## 2019-09-16 DIAGNOSIS — D631 Anemia in chronic kidney disease: Secondary | ICD-10-CM

## 2019-09-16 DIAGNOSIS — N1832 Chronic kidney disease, stage 3b: Secondary | ICD-10-CM

## 2019-09-16 LAB — CBC WITH DIFFERENTIAL/PLATELET
Abs Immature Granulocytes: 0.01 10*3/uL (ref 0.00–0.07)
Basophils Absolute: 0.1 10*3/uL (ref 0.0–0.1)
Basophils Relative: 1 %
Eosinophils Absolute: 0.4 10*3/uL (ref 0.0–0.5)
Eosinophils Relative: 5 %
HCT: 35.9 % — ABNORMAL LOW (ref 36.0–46.0)
Hemoglobin: 10.8 g/dL — ABNORMAL LOW (ref 12.0–15.0)
Immature Granulocytes: 0 %
Lymphocytes Relative: 18 %
Lymphs Abs: 1.3 10*3/uL (ref 0.7–4.0)
MCH: 26.9 pg (ref 26.0–34.0)
MCHC: 30.1 g/dL (ref 30.0–36.0)
MCV: 89.5 fL (ref 80.0–100.0)
Monocytes Absolute: 0.7 10*3/uL (ref 0.1–1.0)
Monocytes Relative: 9 %
Neutro Abs: 4.8 10*3/uL (ref 1.7–7.7)
Neutrophils Relative %: 67 %
Platelets: 198 10*3/uL (ref 150–400)
RBC: 4.01 MIL/uL (ref 3.87–5.11)
RDW: 24.8 % — ABNORMAL HIGH (ref 11.5–15.5)
WBC: 7.2 10*3/uL (ref 4.0–10.5)
nRBC: 0 % (ref 0.0–0.2)

## 2019-09-16 LAB — BASIC METABOLIC PANEL
Anion gap: 9 (ref 5–15)
BUN: 17 mg/dL (ref 8–23)
CO2: 28 mmol/L (ref 22–32)
Calcium: 8.5 mg/dL — ABNORMAL LOW (ref 8.9–10.3)
Chloride: 102 mmol/L (ref 98–111)
Creatinine, Ser: 1.28 mg/dL — ABNORMAL HIGH (ref 0.44–1.00)
GFR calc Af Amer: 41 mL/min — ABNORMAL LOW (ref >60)
GFR calc non Af Amer: 36 mL/min — ABNORMAL LOW (ref >60)
Glucose, Bld: 142 mg/dL — ABNORMAL HIGH (ref 70–99)
Potassium: 4.1 mmol/L (ref 3.5–5.1)
Sodium: 139 mmol/L (ref 135–145)

## 2019-09-16 MED ORDER — IRON SUCROSE 20 MG/ML IV SOLN
200.0000 mg | Freq: Once | INTRAVENOUS | Status: AC
  Administered 2019-09-16: 15:00:00 200 mg via INTRAVENOUS
  Filled 2019-09-16: qty 10

## 2019-09-16 MED ORDER — SODIUM CHLORIDE 0.9 % IV SOLN
Freq: Once | INTRAVENOUS | Status: AC
  Filled 2019-09-16: qty 250

## 2019-09-16 NOTE — Assessment & Plan Note (Addendum)
#   Anemia-secondary chronic kidney disease stage III/iron deficiency [hb-6.1; March 2021].  Ferritin 16 saturation 35%.  S/p IV Venofer.  Hemoglobin today is 9.2.  Improved.  Proceed with infusion.  # CKD- III-overall STABLE.   DISPOSITION: # Venofer today # follow up in 2 months- MD; labs- cbc/bmp- possible venofer/iron studies/ferritin-Dr.B

## 2019-09-16 NOTE — Progress Notes (Signed)
[facility] CONSULT NOTE  Patient Care Team: Kandyce Rud, MD as PCP - General (Family Medicine) Kandyce Rud, MD (Family Medicine)  CHIEF COMPLAINTS/PURPOSE OF CONSULTATION:    HEMATOLOGY HISTORY  # SEVERE ANEMIA [ 6.1; ER PRBC transfusion normal white count /platelets [facility] PCP March 2021] EGD-[< 5 years]; colonoscopy-NEVER [Dr.Masoud];   # CKD stage III; Walker/  HISTORY OF PRESENTING ILLNESS: Patient a poor historian/accompanied by daughter.  Daisy [name] 86 y.o.  female anemia/chronic kidney disease is here for follow-up.  As per the family patient notes to have improvement of energy levels.  Patient is more physically active walking independently.    Review of Systems  Unable to perform ROS: Age    MEDICAL HISTORY:  Past Medical History:  Diagnosis Date  . Acute renal failure (HCC)   . Anemia   . Asthma   . Cerebral artery occlusion    with cerebral infarction  . Dementia (HCC)   . Depression 06/05/2013  . Edema   . Essential hypertension, benign 06/05/2013  . GERD (gastroesophageal reflux disease) 06/05/2013  . Heart attack (HCC)   . Osteoarthritis   . Other and unspecified hyperlipidemia 06/05/2013  . Paronychia, toe   . Recurrent UTI   . Stroke [facility])     SURGICAL HISTORY: Past Surgical History:  Procedure Laterality Date  . CARDIAC SURGERY     Left BBB  . FEMUR IM NAIL Right 10/01/2015   Procedure: INTRAMEDULLARY (IM) NAIL FEMORAL;  Surgeon: Juanell Fairly, MD;  Location: [facility] ORS;  Service: Orthopedics;  Laterality: Right;  . KNEE SURGERY Right     SOCIAL HISTORY: Social History   Socioeconomic History  . Marital status: Widowed    Spouse name: Not on file  . Number of children: Not on file  . Years of education: Not on file  . Highest education level: Not on file  Occupational History  . Not on file  Tobacco Use  . Smoking status: Former Games developer  . Smokeless tobacco: Never Used  . Tobacco comment: quit 50 + years   Substance and Sexual Activity  . Alcohol use: Yes    Alcohol/week: 0.0 standard drinks    Comment: occasionally  . Drug use: No  . Sexual activity: Never  Other Topics Concern  . 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    Social Determinants of Health   Financial Resource Strain:   . Difficulty of Paying Living Expenses:   Food Insecurity:   . Worried About Programme researcher, broadcasting/film/video in the Last Year:   . Barista in the Last Year:   Transportation Needs:   . Freight forwarder (Medical):   Marland Kitchen Lack of Transportation (Non-Medical):   Physical Activity:   . Days of Exercise per Week:   . Minutes of Exercise per Session:   Stress:   . Feeling of Stress :   Social Connections:   . Frequency of Communication with Friends and Family:   . Frequency of Social Gatherings with Friends and Family:   . Attends Religious Services:   . Active Member of Clubs or Organizations:   . Attends Banker Meetings:   Marland Kitchen Marital Status:   Intimate Partner Violence:   . Fear of Current or Ex-Partner:   . Emotionally Abused:   Marland Kitchen Physically Abused:   . Sexually Abused:     FAMILY HISTORY: Family History  Problem Relation Age of Onset  . Prostate cancer Son   . Cancer Mother        Oral  . Colon cancer Sister   . Kidney disease Neg Hx   . Bladder Cancer Neg Hx     ALLERGIES:  is allergic to aricept [donepezil hcl]; codeine; donepezil; and telithromycin.  MEDICATIONS:  Current Outpatient Medications  Medication Sig Dispense Refill  . acetaminophen (TYLENOL) 325 MG tablet Take 650 mg by mouth every 4 (four) hours as needed for fever.    Marland Kitchen aspirin EC 81 MG tablet Take 81 mg by mouth daily.    . carvedilol (COREG) 12.5 MG tablet Take 1 tablet (12.5 mg total) by mouth 2 (two) times daily with a meal. (Patient taking differently: Take 12.5 mg by mouth daily. ) 62 tablet 0  . citalopram  (CELEXA) 10 MG tablet Take 30 mg by mouth daily.    . ferrous sulfate 325 (65 FE) MG EC tablet Take 1 tablet (325 mg total) by mouth 2 (two) times daily. 60 tablet 3  . furosemide (LASIX) 20 MG tablet Take 20 mg by mouth daily.     Marland Kitchen ipratropium-albuterol (DUONEB) 0.5-2.5 (3) MG/3ML SOLN Take 3 mLs by nebulization 3 (three) times daily. 360 mL 1  . losartan (COZAAR) 25 MG tablet Take 50 mg by mouth daily.    . montelukast (SINGULAIR) 10 MG tablet Take 10 mg by mouth at bedtime.    Marland Kitchen nystatin cream (MYCOSTATIN) APPLY TO AFFECTED AREA TWICE A DAY    . simvastatin (ZOCOR) 20 MG tablet Take 20 mg by mouth daily.     . vitamin B-12 (CYANOCOBALAMIN) 1000 MCG tablet Take 1,000 mcg by mouth daily.    . vitamin C (ASCORBIC ACID) 250 MG tablet Take 1 tablet (250 mg total) by mouth daily. 30 tablet 2   No current facility-administered medications for this visit.      PHYSICAL EXAMINATION:   Vitals:   09/16/19 1347  BP: (!) 104/57  Pulse: 64  Resp: 18  Temp: 97.8 F (36.6 C)  SpO2: 94%   Filed Weights   09/16/19 1347  Weight: 185 lb (83.9 kg)    Physical Exam  Constitutional: She is oriented to person, place, and time and well-developed, well-nourished, and in no distress.  Elderly female patient in a wheelchair.  HENT:  Head: Normocephalic and atraumatic.  Mouth/Throat: Oropharynx is clear and moist. No oropharyngeal exudate.  Eyes: Pupils are equal, round, and reactive to light.  Cardiovascular: Normal rate and regular rhythm.  Pulmonary/Chest: Effort normal and breath sounds normal. No respiratory distress. She has no wheezes.  Abdominal: Soft. Bowel sounds are normal. She exhibits no distension and no mass. There is no abdominal tenderness. There is no rebound and no guarding.  Musculoskeletal:        General: No tenderness or edema. Normal range of motion.     Cervical back: Normal range of motion and neck supple.  Neurological: She is alert and oriented to person, place, and  time.  Skin: Skin is warm.  Psychiatric: Affect normal.    LABORATORY DATA:  I have reviewed the data as listed Lab Results  Component Value Date   WBC 7.2 09/16/2019   HGB 10.8 (L) 09/16/2019   HCT 35.9 (L) 09/16/2019   MCV 89.5 09/16/2019   PLT 198 09/16/2019   Recent Labs    07/27/19 1715 08/05/19 1522 09/16/19 1300  NA 138 138 139  K 4.2 4.0 4.1  CL  102 102 102  CO2 28 27 28   GLUCOSE 112* 110* 142*  BUN 20 16 17   CREATININE 1.48* 1.14* 1.28*  CALCIUM 8.5* 8.6* 8.5*  GFRNONAA 30* 41* 36*  GFRAA 35* 47* 41*  PROT 7.2 7.0  --   ALBUMIN 3.5 3.6  --   AST 13* 16  --   ALT 10 10  --   ALKPHOS 51 53  --   BILITOT 0.5 0.6  --      No results found.  Anemia in stage 3b chronic kidney disease # Anemia-secondary chronic kidney disease stage III/iron deficiency [hb-6.1; March 2021].  Ferritin 16 saturation 35%.  S/p IV Venofer.  Hemoglobin today is 9.2.  Improved.  Proceed with infusion.  # CKD- III-overall STABLE.   DISPOSITION: # Venofer today # follow up in 2 months- MD; labs- cbc/bmp- possible venofer/iron studies/ferritin-Dr.B   All questions were answered. The patient knows to call the clinic with any problems, questions or concerns.    Cammie Sickle, MD 09/16/2019 2:12 PM

## 2019-09-16 NOTE — Progress Notes (Signed)
Pt in for follow up, daughter states pt has not had iron pill for 1-2 weeks.  Caregivers missed putting iron in pill box.  Resumed yesterday.

## 2019-11-18 ENCOUNTER — Inpatient Hospital Stay: Payer: PPO

## 2019-11-18 ENCOUNTER — Other Ambulatory Visit: Payer: Self-pay

## 2019-11-18 ENCOUNTER — Inpatient Hospital Stay: Payer: PPO | Attending: Internal Medicine | Admitting: Internal Medicine

## 2019-11-18 VITALS — BP 115/73

## 2019-11-18 DIAGNOSIS — M1990 Unspecified osteoarthritis, unspecified site: Secondary | ICD-10-CM | POA: Diagnosis not present

## 2019-11-18 DIAGNOSIS — I252 Old myocardial infarction: Secondary | ICD-10-CM | POA: Insufficient documentation

## 2019-11-18 DIAGNOSIS — D631 Anemia in chronic kidney disease: Secondary | ICD-10-CM

## 2019-11-18 DIAGNOSIS — Z87891 Personal history of nicotine dependence: Secondary | ICD-10-CM | POA: Diagnosis not present

## 2019-11-18 DIAGNOSIS — Z885 Allergy status to narcotic agent status: Secondary | ICD-10-CM | POA: Insufficient documentation

## 2019-11-18 DIAGNOSIS — Z8673 Personal history of transient ischemic attack (TIA), and cerebral infarction without residual deficits: Secondary | ICD-10-CM | POA: Insufficient documentation

## 2019-11-18 DIAGNOSIS — N183 Chronic kidney disease, stage 3 unspecified: Secondary | ICD-10-CM | POA: Diagnosis not present

## 2019-11-18 DIAGNOSIS — Z87440 Personal history of urinary (tract) infections: Secondary | ICD-10-CM | POA: Diagnosis not present

## 2019-11-18 DIAGNOSIS — N1832 Chronic kidney disease, stage 3b: Secondary | ICD-10-CM

## 2019-11-18 DIAGNOSIS — M25569 Pain in unspecified knee: Secondary | ICD-10-CM | POA: Diagnosis not present

## 2019-11-18 DIAGNOSIS — Z79899 Other long term (current) drug therapy: Secondary | ICD-10-CM | POA: Insufficient documentation

## 2019-11-18 LAB — BASIC METABOLIC PANEL
Anion gap: 8 (ref 5–15)
BUN: 20 mg/dL (ref 8–23)
CO2: 30 mmol/L (ref 22–32)
Calcium: 8.4 mg/dL — ABNORMAL LOW (ref 8.9–10.3)
Chloride: 99 mmol/L (ref 98–111)
Creatinine, Ser: 1.21 mg/dL — ABNORMAL HIGH (ref 0.44–1.00)
GFR calc Af Amer: 44 mL/min — ABNORMAL LOW (ref >60)
GFR calc non Af Amer: 38 mL/min — ABNORMAL LOW (ref >60)
Glucose, Bld: 135 mg/dL — ABNORMAL HIGH (ref 70–99)
Potassium: 4.1 mmol/L (ref 3.5–5.1)
Sodium: 137 mmol/L (ref 135–145)

## 2019-11-18 LAB — CBC WITH DIFFERENTIAL/PLATELET
Abs Immature Granulocytes: 0.01 10*3/uL (ref 0.00–0.07)
Basophils Absolute: 0 10*3/uL (ref 0.0–0.1)
Basophils Relative: 1 %
Eosinophils Absolute: 0.3 10*3/uL (ref 0.0–0.5)
Eosinophils Relative: 4 %
HCT: 37.9 % (ref 36.0–46.0)
Hemoglobin: 11.9 g/dL — ABNORMAL LOW (ref 12.0–15.0)
Immature Granulocytes: 0 %
Lymphocytes Relative: 17 %
Lymphs Abs: 1.2 10*3/uL (ref 0.7–4.0)
MCH: 30.4 pg (ref 26.0–34.0)
MCHC: 31.4 g/dL (ref 30.0–36.0)
MCV: 96.7 fL (ref 80.0–100.0)
Monocytes Absolute: 0.7 10*3/uL (ref 0.1–1.0)
Monocytes Relative: 10 %
Neutro Abs: 4.8 10*3/uL (ref 1.7–7.7)
Neutrophils Relative %: 68 %
Platelets: 184 10*3/uL (ref 150–400)
RBC: 3.92 MIL/uL (ref 3.87–5.11)
RDW: 14.6 % (ref 11.5–15.5)
WBC: 7.1 10*3/uL (ref 4.0–10.5)
nRBC: 0 % (ref 0.0–0.2)

## 2019-11-18 LAB — IRON AND TIBC
Iron: 66 ug/dL (ref 28–170)
Saturation Ratios: 28 % (ref 10.4–31.8)
TIBC: 234 ug/dL — ABNORMAL LOW (ref 250–450)
UIBC: 168 ug/dL

## 2019-11-18 LAB — FERRITIN: Ferritin: 73 ng/mL (ref 11–307)

## 2019-11-18 MED ORDER — SODIUM CHLORIDE 0.9 % IV SOLN
Freq: Once | INTRAVENOUS | Status: AC
  Filled 2019-11-18: qty 250

## 2019-11-18 MED ORDER — IRON SUCROSE 20 MG/ML IV SOLN
200.0000 mg | Freq: Once | INTRAVENOUS | Status: AC
  Administered 2019-11-18: 200 mg via INTRAVENOUS
  Filled 2019-11-18: qty 10

## 2019-11-18 NOTE — Assessment & Plan Note (Addendum)
#   Anemia-secondary chronic kidney disease stage III/iron deficiency [hb-6.1; March 2021].  Ferritin 16 saturation 35%.  S/p IV Venofer.  Hemoglobin today is  11.9.  Improved.  Proceed with infusion. Awaiting on iron studies from today.   # CKD- III-overall STABLE.   DISPOSITION: # Venofer today # follow up in 4 months- MD; labs- cbc/bmp- possible venofer-Dr.B

## 2019-11-18 NOTE — Progress Notes (Signed)
[facility] CONSULT NOTE  Patient Care Team: Kandyce Rud, MD as PCP - General (Family Medicine) Kandyce Rud, MD (Family Medicine)  CHIEF COMPLAINTS/PURPOSE OF CONSULTATION:    HEMATOLOGY HISTORY  # SEVERE ANEMIA [ 6.1; ER PRBC transfusion normal white count /platelets [facility] PCP March 2021] EGD-[< 5 years]; colonoscopy-NEVER [Dr.Masoud];   # CKD stage III; Walker/  HISTORY OF PRESENTING ILLNESS: Patient a poor historian/accompanied by daughter.  Daisy [name] 86 y.o.  female anemia/chronic kidney disease is here for follow-up.  As per the daughter patient energy levels are improved.  However she is not walking much given arthritis/knee pain.  No blood in stools no black or stools.   Review of Systems  Unable to perform ROS: Age    MEDICAL HISTORY:  Past Medical History:  Diagnosis Date  . Acute renal failure (HCC)   . Anemia   . Asthma   . Cerebral artery occlusion    with cerebral infarction  . Dementia (HCC)   . Depression 06/05/2013  . Edema   . Essential hypertension, benign 06/05/2013  . GERD (gastroesophageal reflux disease) 06/05/2013  . Heart attack (HCC)   . Osteoarthritis   . Other and unspecified hyperlipidemia 06/05/2013  . Paronychia, toe   . Recurrent UTI   . Stroke [facility])     SURGICAL HISTORY: Past Surgical History:  Procedure Laterality Date  . CARDIAC SURGERY     Left BBB  . FEMUR IM NAIL Right 10/01/2015   Procedure: INTRAMEDULLARY (IM) NAIL FEMORAL;  Surgeon: Juanell Fairly, MD;  Location: [facility] ORS;  Service: Orthopedics;  Laterality: Right;  . KNEE SURGERY Right     SOCIAL HISTORY: Social History   Socioeconomic History  . Marital status: Widowed    Spouse name: Not on file  . Number of children: Not on file  . Years of education: Not on file  . Highest education level: Not on file  Occupational History  . Not on file  Tobacco Use  . Smoking status: Former Games developer  . Smokeless tobacco: Never Used  .  Tobacco comment: quit 50 + years  Substance and Sexual Activity  . Alcohol use: Yes    Alcohol/week: 0.0 standard drinks    Comment: occasionally  . Drug use: No  . Sexual activity: Never  Other Topics Concern  . 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    Social Determinants of Health   Financial Resource Strain:   . Difficulty of Paying Living Expenses:   Food Insecurity:   . Worried About Programme researcher, broadcasting/film/video in the Last Year:   . Barista in the Last Year:   Transportation Needs:   . Freight forwarder (Medical):   Marland Kitchen Lack of Transportation (Non-Medical):   Physical Activity:   . Days of Exercise per Week:   . Minutes of Exercise per Session:   Stress:   . Feeling of Stress :   Social Connections:   . Frequency of Communication with Friends and Family:   . Frequency of Social Gatherings with Friends and Family:   . Attends Religious Services:   . Active Member of Clubs or Organizations:   . Attends Banker Meetings:   Marland Kitchen Marital Status:   Intimate Partner Violence:   . Fear of Current or Ex-Partner:   . Emotionally Abused:   Marland Kitchen Physically Abused:   . Sexually Abused:  FAMILY HISTORY: Family History  Problem Relation Age of Onset  . Prostate cancer Son   . Cancer Mother        Oral  . Colon cancer Sister   . Kidney disease Neg Hx   . Bladder Cancer Neg Hx     ALLERGIES:  is allergic to aricept [donepezil hcl], codeine, donepezil, and telithromycin.  MEDICATIONS:  Current Outpatient Medications  Medication Sig Dispense Refill  . acetaminophen (TYLENOL) 325 MG tablet Take 650 mg by mouth every 4 (four) hours as needed for fever.    . Ascorbic Acid (VITAMIN C) 100 MG tablet Take 100 mg by mouth daily.    Marland Kitchen aspirin EC 81 MG tablet Take 81 mg by mouth daily.    . carvedilol (COREG) 12.5 MG tablet Take 1 tablet (12.5 mg total) by mouth 2 (two)  times daily with a meal. (Patient taking differently: Take 12.5 mg by mouth daily. ) 62 tablet 0  . citalopram (CELEXA) 10 MG tablet Take 30 mg by mouth daily.    . ferrous sulfate 325 (65 FE) MG EC tablet Take 1 tablet (325 mg total) by mouth 2 (two) times daily. 60 tablet 3  . furosemide (LASIX) 20 MG tablet Take 20 mg by mouth daily.     Marland Kitchen ipratropium-albuterol (DUONEB) 0.5-2.5 (3) MG/3ML SOLN Take 3 mLs by nebulization 3 (three) times daily. 360 mL 1  . losartan (COZAAR) 25 MG tablet Take 50 mg by mouth daily.    . montelukast (SINGULAIR) 10 MG tablet Take 10 mg by mouth at bedtime.    Marland Kitchen nystatin cream (MYCOSTATIN) APPLY TO AFFECTED AREA TWICE A DAY    . simvastatin (ZOCOR) 20 MG tablet Take 20 mg by mouth daily.     . vitamin B-12 (CYANOCOBALAMIN) 1000 MCG tablet Take 1,000 mcg by mouth daily.     No current facility-administered medications for this visit.   Facility-Administered Medications Ordered in Other Visits  Medication Dose Route Frequency Provider Last Rate Last Admin  . iron sucrose (VENOFER) injection 200 mg  200 mg Intravenous Once Louretta Shorten R, MD          PHYSICAL EXAMINATION:   Vitals:   11/18/19 1317  BP: (!) 99/58  Pulse: 66  Resp: 16  Temp: 99.5 F (37.5 C)  SpO2: 94%   Filed Weights   11/18/19 1317  Weight: 221 lb 1.6 oz (100.3 kg)    Physical Exam Constitutional:      Comments: Elderly female patient in a wheelchair.  HENT:     Head: Normocephalic and atraumatic.     Mouth/Throat:     Pharynx: No oropharyngeal exudate.  Eyes:     Pupils: Pupils are equal, round, and reactive to light.  Cardiovascular:     Rate and Rhythm: Normal rate and regular rhythm.  Pulmonary:     Effort: Pulmonary effort is normal. No respiratory distress.     Breath sounds: Normal breath sounds. No wheezing.  Abdominal:     General: Bowel sounds are normal. There is no distension.     Palpations: Abdomen is soft. There is no mass.     Tenderness: There is  no abdominal tenderness. There is no guarding or rebound.  Musculoskeletal:        General: No tenderness. Normal range of motion.     Cervical back: Normal range of motion and neck supple.  Skin:    General: Skin is warm.  Neurological:     Mental Status: She  is alert and oriented to person, place, and time.  Psychiatric:        Mood and Affect: Affect normal.     LABORATORY DATA:  I have reviewed the data as listed Lab Results  Component Value Date   WBC 7.1 11/18/2019   HGB 11.9 (L) 11/18/2019   HCT 37.9 11/18/2019   MCV 96.7 11/18/2019   PLT 184 11/18/2019   Recent Labs    07/27/19 1715 07/27/19 1715 08/05/19 1522 09/16/19 1300 11/18/19 1254  NA 138   < > 138 139 137  K 4.2   < > 4.0 4.1 4.1  CL 102   < > 102 102 99  CO2 28   < > 27 28 30   GLUCOSE 112*   < > 110* 142* 135*  BUN 20   < > 16 17 20   CREATININE 1.48*   < > 1.14* 1.28* 1.21*  CALCIUM 8.5*   < > 8.6* 8.5* 8.4*  GFRNONAA 30*   < > 41* 36* 38*  GFRAA 35*   < > 47* 41* 44*  PROT 7.2  --  7.0  --   --   ALBUMIN 3.5  --  3.6  --   --   AST 13*  --  16  --   --   ALT 10  --  10  --   --   ALKPHOS 51  --  53  --   --   BILITOT 0.5  --  0.6  --   --    < > = values in this interval not displayed.     No results found.  Anemia in stage 3b chronic kidney disease # Anemia-secondary chronic kidney disease stage III/iron deficiency [hb-6.1; March 2021].  Ferritin 16 saturation 35%.  S/p IV Venofer.  Hemoglobin today is  11.9.  Improved.  Proceed with infusion. Awaiting on iron studies from today.   # CKD- III-overall STABLE.   DISPOSITION: # Venofer today # follow up in 4 months- MD; labs- cbc/bmp- possible venofer-Dr.B   All questions were answered. The patient knows to call the clinic with any problems, questions or concerns.    , MD 11/18/2019 1:58 PM

## 2020-02-11 DIAGNOSIS — R220 Localized swelling, mass and lump, head: Secondary | ICD-10-CM | POA: Diagnosis not present

## 2020-03-15 ENCOUNTER — Other Ambulatory Visit: Payer: Self-pay

## 2020-03-15 DIAGNOSIS — N1832 Chronic kidney disease, stage 3b: Secondary | ICD-10-CM

## 2020-03-16 ENCOUNTER — Inpatient Hospital Stay: Payer: PPO

## 2020-03-16 ENCOUNTER — Inpatient Hospital Stay: Payer: PPO | Attending: Internal Medicine

## 2020-03-16 ENCOUNTER — Other Ambulatory Visit: Payer: Self-pay

## 2020-03-16 ENCOUNTER — Encounter: Payer: Self-pay | Admitting: Internal Medicine

## 2020-03-16 ENCOUNTER — Inpatient Hospital Stay (HOSPITAL_BASED_OUTPATIENT_CLINIC_OR_DEPARTMENT_OTHER): Payer: PPO | Admitting: Internal Medicine

## 2020-03-16 DIAGNOSIS — D631 Anemia in chronic kidney disease: Secondary | ICD-10-CM | POA: Insufficient documentation

## 2020-03-16 DIAGNOSIS — N1832 Chronic kidney disease, stage 3b: Secondary | ICD-10-CM | POA: Diagnosis not present

## 2020-03-16 DIAGNOSIS — Z87891 Personal history of nicotine dependence: Secondary | ICD-10-CM | POA: Diagnosis not present

## 2020-03-16 DIAGNOSIS — Z993 Dependence on wheelchair: Secondary | ICD-10-CM | POA: Diagnosis not present

## 2020-03-16 LAB — CBC WITH DIFFERENTIAL/PLATELET
Abs Immature Granulocytes: 0.02 10*3/uL (ref 0.00–0.07)
Basophils Absolute: 0 10*3/uL (ref 0.0–0.1)
Basophils Relative: 1 %
Eosinophils Absolute: 0.3 10*3/uL (ref 0.0–0.5)
Eosinophils Relative: 4 %
HCT: 40.1 % (ref 36.0–46.0)
Hemoglobin: 12.8 g/dL (ref 12.0–15.0)
Immature Granulocytes: 0 %
Lymphocytes Relative: 16 %
Lymphs Abs: 1.3 10*3/uL (ref 0.7–4.0)
MCH: 31.4 pg (ref 26.0–34.0)
MCHC: 31.9 g/dL (ref 30.0–36.0)
MCV: 98.3 fL (ref 80.0–100.0)
Monocytes Absolute: 0.7 10*3/uL (ref 0.1–1.0)
Monocytes Relative: 8 %
Neutro Abs: 5.8 10*3/uL (ref 1.7–7.7)
Neutrophils Relative %: 71 %
Platelets: 173 10*3/uL (ref 150–400)
RBC: 4.08 MIL/uL (ref 3.87–5.11)
RDW: 13.2 % (ref 11.5–15.5)
WBC: 8.1 10*3/uL (ref 4.0–10.5)
nRBC: 0 % (ref 0.0–0.2)

## 2020-03-16 LAB — BASIC METABOLIC PANEL
Anion gap: 9 (ref 5–15)
BUN: 19 mg/dL (ref 8–23)
CO2: 29 mmol/L (ref 22–32)
Calcium: 8.2 mg/dL — ABNORMAL LOW (ref 8.9–10.3)
Chloride: 99 mmol/L (ref 98–111)
Creatinine, Ser: 1.05 mg/dL — ABNORMAL HIGH (ref 0.44–1.00)
GFR, Estimated: 49 mL/min — ABNORMAL LOW (ref >60)
Glucose, Bld: 167 mg/dL — ABNORMAL HIGH (ref 70–99)
Potassium: 3.6 mmol/L (ref 3.5–5.1)
Sodium: 137 mmol/L (ref 135–145)

## 2020-03-16 NOTE — Assessment & Plan Note (Addendum)
#   Anemia-secondary chronic kidney disease stage III/iron deficiency [hb-6.1; March 2021].  Ferritin 16 saturation 35%.  S/p IV Venofer.  Hemoglobin today is  12.8 Improved. HOLD infusion.   # CKD- III-overall STABLE.   DISPOSITION: # HOLD Venofer today # follow up in 4 months- MD; labs- cbc/bmp/iron studies/ferritin-- possible venofer-Dr.B

## 2020-03-16 NOTE — Progress Notes (Signed)
[facility] CONSULT NOTE  Patient Care Team: Kandyce Rud, MD as PCP - General (Family Medicine) Kandyce Rud, MD (Family Medicine)  CHIEF COMPLAINTS/PURPOSE OF CONSULTATION:    HEMATOLOGY HISTORY  # SEVERE ANEMIA [ 6.1; ER PRBC transfusion normal white count /platelets [facility] PCP March 2021] EGD-[< 5 years]; colonoscopy-NEVER [Dr.Masoud];   # CKD stage III; Walker/  HISTORY OF PRESENTING ILLNESS: Patient a poor historian/accompanied by son.   Daisy [name] 86 y.o.  female anemia/chronic kidney disease is here for follow-up.  As per the son patient has been doing fairly well.  However she has limited mobility because of arthritis/knee pain.  No obvious blood in stools.   Review of Systems  Unable to perform ROS: Age    MEDICAL HISTORY:  Past Medical History:  Diagnosis Date  . Acute renal failure (HCC)   . Anemia   . Asthma   . Cerebral artery occlusion    with cerebral infarction  . Dementia (HCC)   . Depression 06/05/2013  . Edema   . Essential hypertension, benign 06/05/2013  . GERD (gastroesophageal reflux disease) 06/05/2013  . Heart attack (HCC)   . Osteoarthritis   . Other and unspecified hyperlipidemia 06/05/2013  . Paronychia, toe   . Recurrent UTI   . Stroke [facility])     SURGICAL HISTORY: Past Surgical History:  Procedure Laterality Date  . CARDIAC SURGERY     Left BBB  . FEMUR IM NAIL Right 10/01/2015   Procedure: INTRAMEDULLARY (IM) NAIL FEMORAL;  Surgeon: Juanell Fairly, MD;  Location: [facility] ORS;  Service: Orthopedics;  Laterality: Right;  . KNEE SURGERY Right     SOCIAL HISTORY: Social History   Socioeconomic History  . Marital status: Widowed    Spouse name: Not on file  . Number of children: Not on file  . Years of education: Not on file  . Highest education level: Not on file  Occupational History  . Not on file  Tobacco Use  . Smoking status: Former Games developer  . Smokeless tobacco: Never Used  . Tobacco comment:  quit 50 + years  Substance and Sexual Activity  . Alcohol use: Yes    Alcohol/week: 0.0 standard drinks    Comment: occasionally  . Drug use: No  . Sexual activity: Never  Other Topics Concern  . 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    Social Determinants of Health   Financial Resource Strain:   . Difficulty of Paying Living Expenses: Not on file  Food Insecurity:   . Worried About Programme researcher, broadcasting/film/video in the Last Year: Not on file  . Ran Out of Food in the Last Year: Not on file  Transportation Needs:   . Lack of Transportation (Medical): Not on file  . Lack of Transportation (Non-Medical): Not on file  Physical Activity:   . Days of Exercise per Week: Not on file  . Minutes of Exercise per Session: Not on file  Stress:   . Feeling of Stress : Not on file  Social Connections:   . Frequency of Communication with Friends and Family: Not on file  . Frequency of Social Gatherings with Friends and Family: Not on file  . Attends Religious Services: Not on file  . Active Member of Clubs or Organizations: Not on file  . Attends Banker Meetings: Not on file  . Marital Status: Not on file  Intimate  Partner Violence:   . Fear of Current or Ex-Partner: Not on file  . Emotionally Abused: Not on file  . Physically Abused: Not on file  . Sexually Abused: Not on file    FAMILY HISTORY: Family History  Problem Relation Age of Onset  . Prostate cancer Son   . Cancer Mother        Oral  . Colon cancer Sister   . Kidney disease Neg Hx   . Bladder Cancer Neg Hx     ALLERGIES:  is allergic to aricept [donepezil hcl], codeine, donepezil, and telithromycin.  MEDICATIONS:  Current Outpatient Medications  Medication Sig Dispense Refill  . acetaminophen (TYLENOL) 325 MG tablet Take 650 mg by mouth every 4 (four) hours as needed for fever.    . Ascorbic Acid (VITAMIN C) 100 MG  tablet Take 100 mg by mouth daily.    Marland Kitchen aspirin EC 81 MG tablet Take 81 mg by mouth daily.    . carvedilol (COREG) 12.5 MG tablet Take 1 tablet (12.5 mg total) by mouth 2 (two) times daily with a meal. (Patient taking differently: Take 12.5 mg by mouth daily. ) 62 tablet 0  . citalopram (CELEXA) 10 MG tablet Take 30 mg by mouth daily.    . ferrous sulfate 325 (65 FE) MG EC tablet Take 1 tablet (325 mg total) by mouth 2 (two) times daily. 60 tablet 3  . furosemide (LASIX) 20 MG tablet Take 20 mg by mouth daily.     Marland Kitchen ipratropium-albuterol (DUONEB) 0.5-2.5 (3) MG/3ML SOLN Take 3 mLs by nebulization 3 (three) times daily. 360 mL 1  . losartan (COZAAR) 25 MG tablet Take 50 mg by mouth daily.    . montelukast (SINGULAIR) 10 MG tablet Take 10 mg by mouth at bedtime.    Marland Kitchen nystatin cream (MYCOSTATIN) APPLY TO AFFECTED AREA TWICE A DAY    . simvastatin (ZOCOR) 20 MG tablet Take 20 mg by mouth daily.     . vitamin B-12 (CYANOCOBALAMIN) 1000 MCG tablet Take 1,000 mcg by mouth daily.     No current facility-administered medications for this visit.      PHYSICAL EXAMINATION:   Vitals:   03/16/20 1317  BP: 124/60  Pulse: 62  Temp: 98.7 F (37.1 C)  SpO2: 95%   Filed Weights   03/16/20 1317  Weight: 178 lb (80.7 kg)    Physical Exam Constitutional:      Comments: Elderly female patient in a wheelchair.  HENT:     Head: Normocephalic and atraumatic.     Mouth/Throat:     Pharynx: No oropharyngeal exudate.  Eyes:     Pupils: Pupils are equal, round, and reactive to light.  Cardiovascular:     Rate and Rhythm: Normal rate and regular rhythm.  Pulmonary:     Effort: Pulmonary effort is normal. No respiratory distress.     Breath sounds: Normal breath sounds. No wheezing.  Abdominal:     General: Bowel sounds are normal. There is no distension.     Palpations: Abdomen is soft. There is no mass.     Tenderness: There is no abdominal tenderness. There is no guarding or rebound.   Musculoskeletal:        General: No tenderness. Normal range of motion.     Cervical back: Normal range of motion and neck supple.  Skin:    General: Skin is warm.  Neurological:     Mental Status: She is alert and oriented to person, place, and  time.  Psychiatric:        Mood and Affect: Affect normal.     LABORATORY DATA:  I have reviewed the data as listed Lab Results  Component Value Date   WBC 8.1 03/16/2020   HGB 12.8 03/16/2020   HCT 40.1 03/16/2020   MCV 98.3 03/16/2020   PLT 173 03/16/2020   Recent Labs    07/27/19 1715 07/27/19 1715 08/05/19 1522 08/05/19 1522 09/16/19 1300 11/18/19 1254 03/16/20 1300  NA 138   < > 138   < > 139 137 137  K 4.2   < > 4.0   < > 4.1 4.1 3.6  CL 102   < > 102   < > 102 99 99  CO2 28   < > 27   < > 28 30 29   GLUCOSE 112*   < > 110*   < > 142* 135* 167*  BUN 20   < > 16   < > 17 20 19   CREATININE 1.48*   < > 1.14*   < > 1.28* 1.21* 1.05*  CALCIUM 8.5*   < > 8.6*   < > 8.5* 8.4* 8.2*  GFRNONAA 30*   < > 41*   < > 36* 38* 49*  GFRAA 35*   < > 47*  --  41* 44*  --   PROT 7.2  --  7.0  --   --   --   --   ALBUMIN 3.5  --  3.6  --   --   --   --   AST 13*  --  16  --   --   --   --   ALT 10  --  10  --   --   --   --   ALKPHOS 51  --  53  --   --   --   --   BILITOT 0.5  --  0.6  --   --   --   --    < > = values in this interval not displayed.     No results found.  Anemia in stage 3b chronic kidney disease # Anemia-secondary chronic kidney disease stage III/iron deficiency [hb-6.1; March 2021].  Ferritin 16 saturation 35%.  S/p IV Venofer.  Hemoglobin today is  12.8 Improved. HOLD infusion.   # CKD- III-overall STABLE.   DISPOSITION: # HOLD Venofer today # follow up in 4 months- MD; labs- cbc/bmp/iron studies/ferritin-- possible venofer-Dr.B   All questions were answered. The patient knows to call the clinic with any problems, questions or concerns.    , MD 03/16/2020 1:50 PM

## 2020-03-16 NOTE — Addendum Note (Signed)
Addended by: Mercer Pod E on: 03/16/2020 02:08 PM   Modules accepted: Orders

## 2020-07-18 ENCOUNTER — Inpatient Hospital Stay: Payer: PPO

## 2020-07-18 ENCOUNTER — Inpatient Hospital Stay (HOSPITAL_BASED_OUTPATIENT_CLINIC_OR_DEPARTMENT_OTHER): Payer: PPO | Admitting: Internal Medicine

## 2020-07-18 ENCOUNTER — Other Ambulatory Visit: Payer: Self-pay

## 2020-07-18 ENCOUNTER — Encounter: Payer: Self-pay | Admitting: Internal Medicine

## 2020-07-18 ENCOUNTER — Inpatient Hospital Stay: Payer: PPO | Attending: Internal Medicine

## 2020-07-18 DIAGNOSIS — Z87891 Personal history of nicotine dependence: Secondary | ICD-10-CM | POA: Diagnosis not present

## 2020-07-18 DIAGNOSIS — F0390 Unspecified dementia without behavioral disturbance: Secondary | ICD-10-CM | POA: Diagnosis not present

## 2020-07-18 DIAGNOSIS — N1832 Chronic kidney disease, stage 3b: Secondary | ICD-10-CM

## 2020-07-18 DIAGNOSIS — D631 Anemia in chronic kidney disease: Secondary | ICD-10-CM | POA: Diagnosis not present

## 2020-07-18 LAB — BASIC METABOLIC PANEL
Anion gap: 10 (ref 5–15)
BUN: 21 mg/dL (ref 8–23)
CO2: 26 mmol/L (ref 22–32)
Calcium: 8.4 mg/dL — ABNORMAL LOW (ref 8.9–10.3)
Chloride: 100 mmol/L (ref 98–111)
Creatinine, Ser: 0.93 mg/dL (ref 0.44–1.00)
GFR, Estimated: 56 mL/min — ABNORMAL LOW (ref >60)
Glucose, Bld: 158 mg/dL — ABNORMAL HIGH (ref 70–99)
Potassium: 3.9 mmol/L (ref 3.5–5.1)
Sodium: 136 mmol/L (ref 135–145)

## 2020-07-18 LAB — CBC WITH DIFFERENTIAL/PLATELET
Abs Immature Granulocytes: 0.02 10*3/uL (ref 0.00–0.07)
Basophils Absolute: 0 10*3/uL (ref 0.0–0.1)
Basophils Relative: 1 %
Eosinophils Absolute: 0.3 10*3/uL (ref 0.0–0.5)
Eosinophils Relative: 4 %
HCT: 40.7 % (ref 36.0–46.0)
Hemoglobin: 12.6 g/dL (ref 12.0–15.0)
Immature Granulocytes: 0 %
Lymphocytes Relative: 17 %
Lymphs Abs: 1.4 10*3/uL (ref 0.7–4.0)
MCH: 30.7 pg (ref 26.0–34.0)
MCHC: 31 g/dL (ref 30.0–36.0)
MCV: 99 fL (ref 80.0–100.0)
Monocytes Absolute: 0.7 10*3/uL (ref 0.1–1.0)
Monocytes Relative: 8 %
Neutro Abs: 5.8 10*3/uL (ref 1.7–7.7)
Neutrophils Relative %: 70 %
Platelets: 170 10*3/uL (ref 150–400)
RBC: 4.11 MIL/uL (ref 3.87–5.11)
RDW: 13.9 % (ref 11.5–15.5)
WBC: 8.2 10*3/uL (ref 4.0–10.5)
nRBC: 0 % (ref 0.0–0.2)

## 2020-07-18 LAB — FERRITIN: Ferritin: 104 ng/mL (ref 11–307)

## 2020-07-18 LAB — IRON AND TIBC
Iron: 56 ug/dL (ref 28–170)
Saturation Ratios: 23 % (ref 10.4–31.8)
TIBC: 246 ug/dL — ABNORMAL LOW (ref 250–450)
UIBC: 190 ug/dL

## 2020-07-18 NOTE — Progress Notes (Signed)
[facility] CONSULT NOTE  Patient Care Team: Kandyce Rud, MD as PCP - General (Family Medicine) Kandyce Rud, MD (Family Medicine)  CHIEF COMPLAINTS/PURPOSE OF CONSULTATION:    HEMATOLOGY HISTORY  # SEVERE ANEMIA [ 6.1; ER PRBC transfusion normal white count /platelets [facility] PCP March 2021] EGD-[< 5 years]; colonoscopy-NEVER [Dr.Masoud];   # CKD stage III; Walker/Dementia- mild to moderate.   HISTORY OF PRESENTING ILLNESS: Patient a poor historian/accompanied by daughter.   Daisy [name] 87 y.o.  female anemia/chronic kidney disease is here for follow-up.  As per the daughter patient has been doing fairly well.  As per the family patient has not had any blood in stools or black-colored stools.  Patient has limited mobility because of arthritis just knee pain.  Patient also has mild to moderate dementia.  Review of Systems  Unable to perform ROS: Age    MEDICAL HISTORY:  Past Medical History:  Diagnosis Date  . Acute renal failure (HCC)   . Anemia   . Asthma   . Cerebral artery occlusion    with cerebral infarction  . Dementia (HCC)   . Depression 06/05/2013  . Edema   . Essential hypertension, benign 06/05/2013  . GERD (gastroesophageal reflux disease) 06/05/2013  . Heart attack (HCC)   . Osteoarthritis   . Other and unspecified hyperlipidemia 06/05/2013  . Paronychia, toe   . Recurrent UTI   . Stroke [facility])     SURGICAL HISTORY: Past Surgical History:  Procedure Laterality Date  . CARDIAC SURGERY     Left BBB  . FEMUR IM NAIL Right 10/01/2015   Procedure: INTRAMEDULLARY (IM) NAIL FEMORAL;  Surgeon: Juanell Fairly, MD;  Location: [facility] ORS;  Service: Orthopedics;  Laterality: Right;  . KNEE SURGERY Right     SOCIAL HISTORY: Social History   Socioeconomic History  . Marital status: Widowed    Spouse name: Not on file  . Number of children: Not on file  . Years of education: Not on file  . Highest education level: Not on file   Occupational History  . Not on file  Tobacco Use  . Smoking status: Former Games developer  . Smokeless tobacco: Never Used  . Tobacco comment: quit 50 + years  Substance and Sexual Activity  . Alcohol use: Yes    Alcohol/week: 0.0 standard drinks    Comment: occasionally  . Drug use: No  . Sexual activity: Never  Other Topics Concern  . 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    Social Determinants of Health   Financial Resource Strain: Not on file  Food Insecurity: Not on file  Transportation Needs: Not on file  Physical Activity: Not on file  Stress: Not on file  Social Connections: Not on file  Intimate Partner Violence: Not on file    FAMILY HISTORY: Family History  Problem Relation Age of Onset  . Prostate cancer Son   . Cancer Mother        Oral  . Colon cancer Sister   . Kidney disease Neg Hx   . Bladder Cancer Neg Hx     ALLERGIES:  is allergic to aricept [donepezil hcl], codeine, donepezil, and telithromycin.  MEDICATIONS:  Current Outpatient Medications  Medication Sig Dispense Refill  . acetaminophen (TYLENOL) 325 MG tablet Take 650 mg by mouth every 4 (four) hours as needed for fever.    . Ascorbic Acid (VITAMIN C) 100 MG  tablet Take 100 mg by mouth daily.    Marland Kitchen aspirin EC 81 MG tablet Take 81 mg by mouth daily.    . carvedilol (COREG) 12.5 MG tablet Take 1 tablet (12.5 mg total) by mouth 2 (two) times daily with a meal. (Patient taking differently: Take 12.5 mg by mouth daily. ) 62 tablet 0  . citalopram (CELEXA) 10 MG tablet Take 30 mg by mouth daily.    . ferrous sulfate 325 (65 FE) MG EC tablet Take 1 tablet (325 mg total) by mouth 2 (two) times daily. 60 tablet 3  . furosemide (LASIX) 20 MG tablet Take 20 mg by mouth daily.     Marland Kitchen ipratropium-albuterol (DUONEB) 0.5-2.5 (3) MG/3ML SOLN Take 3 mLs by nebulization 3 (three) times daily. 360 mL 1  . losartan (COZAAR) 25  MG tablet Take 50 mg by mouth daily.    . montelukast (SINGULAIR) 10 MG tablet Take 10 mg by mouth at bedtime.    Marland Kitchen nystatin cream (MYCOSTATIN) APPLY TO AFFECTED AREA TWICE A DAY    . simvastatin (ZOCOR) 20 MG tablet Take 20 mg by mouth daily.     . vitamin B-12 (CYANOCOBALAMIN) 1000 MCG tablet Take 1,000 mcg by mouth daily.     No current facility-administered medications for this visit.      PHYSICAL EXAMINATION:   Vitals:   07/18/20 1325  BP: 134/72  Pulse: 63  Resp: 20  Temp: 98 F (36.7 C)   Filed Weights   07/18/20 1325  Weight: 173 lb (78.5 kg)    Physical Exam Constitutional:      Comments: Elderly female patient in a wheelchair.  HENT:     Head: Normocephalic and atraumatic.     Mouth/Throat:     Pharynx: No oropharyngeal exudate.  Eyes:     Pupils: Pupils are equal, round, and reactive to light.  Cardiovascular:     Rate and Rhythm: Normal rate and regular rhythm.  Pulmonary:     Effort: Pulmonary effort is normal. No respiratory distress.     Breath sounds: Normal breath sounds. No wheezing.  Abdominal:     General: Bowel sounds are normal. There is no distension.     Palpations: Abdomen is soft. There is no mass.     Tenderness: There is no abdominal tenderness. There is no guarding or rebound.  Musculoskeletal:        General: No tenderness. Normal range of motion.     Cervical back: Normal range of motion and neck supple.  Skin:    General: Skin is warm.  Neurological:     Mental Status: She is alert and oriented to person, place, and time.  Psychiatric:        Mood and Affect: Affect normal.     LABORATORY DATA:  I have reviewed the data as listed Lab Results  Component Value Date   WBC 8.2 07/18/2020   HGB 12.6 07/18/2020   HCT 40.7 07/18/2020   MCV 99.0 07/18/2020   PLT 170 07/18/2020   Recent Labs    07/27/19 1715 08/05/19 1522 09/16/19 1300 11/18/19 1254 03/16/20 1300 07/18/20 1252  NA 138 138 139 137 137 136  K 4.2 4.0  4.1 4.1 3.6 3.9  CL 102 102 102 99 99 100  CO2 28 27 28 30 29 26   GLUCOSE 112* 110* 142* 135* 167* 158*  BUN 20 16 17 20 19 21   CREATININE 1.48* 1.14* 1.28* 1.21* 1.05* 0.93  CALCIUM 8.5* 8.6* 8.5* 8.4*  8.2* 8.4*  GFRNONAA 30* 41* 36* 38* 49* 56*  GFRAA 35* 47* 41* 44*  --   --   PROT 7.2 7.0  --   --   --   --   ALBUMIN 3.5 3.6  --   --   --   --   AST 13* 16  --   --   --   --   ALT 10 10  --   --   --   --   ALKPHOS 51 53  --   --   --   --   BILITOT 0.5 0.6  --   --   --   --      No results found.  Anemia in stage 3b chronic kidney disease # Anemia-secondary chronic kidney disease stage III/iron deficiency [hb-6.1; March 2021].  Ferritin 16 saturation 35%.  S/p IV Venofer.  Hemoglobin today is  12.6- STABLE. HOLD infusion.    # CKD- III-overall STABLE.   # DEMENTIA- STABLE.   DISPOSITION: # HOLD Venofer today # follow up in 6 months- MD; labs- cbc/bmp/iron studies/ferritin-- possible venofer-Dr.B   All questions were answered. The patient knows to call the clinic with any problems, questions or concerns.    Earna Coder, MD 07/18/2020 7:08 PM

## 2020-07-18 NOTE — Assessment & Plan Note (Signed)
#   Anemia-secondary chronic kidney disease stage III/iron deficiency [hb-6.1; March 2021].  Ferritin 16 saturation 35%.  S/p IV Venofer.  Hemoglobin today is  12.6- STABLE. HOLD infusion.    # CKD- III-overall STABLE.   # DEMENTIA- STABLE.   DISPOSITION: # HOLD Venofer today # follow up in 6 months- MD; labs- cbc/bmp/iron studies/ferritin-- possible venofer-Dr.B

## 2020-10-04 DIAGNOSIS — F325 Major depressive disorder, single episode, in full remission: Secondary | ICD-10-CM | POA: Diagnosis not present

## 2020-10-04 DIAGNOSIS — I5032 Chronic diastolic (congestive) heart failure: Secondary | ICD-10-CM | POA: Diagnosis not present

## 2020-10-04 DIAGNOSIS — Z0000 Encounter for general adult medical examination without abnormal findings: Secondary | ICD-10-CM | POA: Diagnosis not present

## 2020-10-04 DIAGNOSIS — G301 Alzheimer's disease with late onset: Secondary | ICD-10-CM | POA: Diagnosis not present

## 2020-10-04 DIAGNOSIS — F0280 Dementia in other diseases classified elsewhere without behavioral disturbance: Secondary | ICD-10-CM | POA: Diagnosis not present

## 2020-10-04 DIAGNOSIS — I10 Essential (primary) hypertension: Secondary | ICD-10-CM | POA: Diagnosis not present

## 2020-10-04 DIAGNOSIS — N1831 Chronic kidney disease, stage 3a: Secondary | ICD-10-CM | POA: Diagnosis not present

## 2021-01-09 ENCOUNTER — Encounter: Payer: Self-pay | Admitting: Internal Medicine

## 2021-01-10 ENCOUNTER — Encounter: Payer: Self-pay | Admitting: Internal Medicine

## 2021-01-11 ENCOUNTER — Encounter: Payer: Self-pay | Admitting: Internal Medicine

## 2021-01-16 ENCOUNTER — Inpatient Hospital Stay

## 2021-01-16 ENCOUNTER — Inpatient Hospital Stay: Admitting: Internal Medicine

## 2021-03-05 ENCOUNTER — Encounter: Payer: Self-pay | Admitting: Internal Medicine

## 2021-03-05 ENCOUNTER — Emergency Department

## 2021-03-05 ENCOUNTER — Inpatient Hospital Stay
Admission: EM | Admit: 2021-03-05 | Discharge: 2021-03-07 | DRG: 185 | Disposition: A | Discharge status: Hospice | Attending: Internal Medicine | Admitting: Internal Medicine

## 2021-03-05 DIAGNOSIS — I252 Old myocardial infarction: Secondary | ICD-10-CM

## 2021-03-05 DIAGNOSIS — E785 Hyperlipidemia, unspecified: Secondary | ICD-10-CM | POA: Diagnosis present

## 2021-03-05 DIAGNOSIS — Z888 Allergy status to other drugs, medicaments and biological substances status: Secondary | ICD-10-CM

## 2021-03-05 DIAGNOSIS — F419 Anxiety disorder, unspecified: Secondary | ICD-10-CM | POA: Diagnosis present

## 2021-03-05 DIAGNOSIS — Y92009 Unspecified place in unspecified non-institutional (private) residence as the place of occurrence of the external cause: Secondary | ICD-10-CM

## 2021-03-05 DIAGNOSIS — F0390 Unspecified dementia without behavioral disturbance: Secondary | ICD-10-CM | POA: Diagnosis not present

## 2021-03-05 DIAGNOSIS — W19XXXA Unspecified fall, initial encounter: Secondary | ICD-10-CM | POA: Diagnosis not present

## 2021-03-05 DIAGNOSIS — I10 Essential (primary) hypertension: Secondary | ICD-10-CM | POA: Diagnosis present

## 2021-03-05 DIAGNOSIS — R079 Chest pain, unspecified: Secondary | ICD-10-CM | POA: Diagnosis present

## 2021-03-05 DIAGNOSIS — S2249XA Multiple fractures of ribs, unspecified side, initial encounter for closed fracture: Secondary | ICD-10-CM | POA: Diagnosis present

## 2021-03-05 DIAGNOSIS — Z7401 Bed confinement status: Secondary | ICD-10-CM | POA: Diagnosis not present

## 2021-03-05 DIAGNOSIS — Z87440 Personal history of urinary (tract) infections: Secondary | ICD-10-CM | POA: Diagnosis not present

## 2021-03-05 DIAGNOSIS — Z885 Allergy status to narcotic agent status: Secondary | ICD-10-CM | POA: Diagnosis not present

## 2021-03-05 DIAGNOSIS — E669 Obesity, unspecified: Secondary | ICD-10-CM | POA: Diagnosis present

## 2021-03-05 DIAGNOSIS — Z6834 Body mass index (BMI) 34.0-34.9, adult: Secondary | ICD-10-CM

## 2021-03-05 DIAGNOSIS — J449 Chronic obstructive pulmonary disease, unspecified: Secondary | ICD-10-CM | POA: Diagnosis present

## 2021-03-05 DIAGNOSIS — S2241XA Multiple fractures of ribs, right side, initial encounter for closed fracture: Principal | ICD-10-CM | POA: Diagnosis present

## 2021-03-05 DIAGNOSIS — R0902 Hypoxemia: Secondary | ICD-10-CM | POA: Diagnosis present

## 2021-03-05 DIAGNOSIS — K219 Gastro-esophageal reflux disease without esophagitis: Secondary | ICD-10-CM | POA: Diagnosis present

## 2021-03-05 DIAGNOSIS — Z7982 Long term (current) use of aspirin: Secondary | ICD-10-CM

## 2021-03-05 DIAGNOSIS — I129 Hypertensive chronic kidney disease with stage 1 through stage 4 chronic kidney disease, or unspecified chronic kidney disease: Secondary | ICD-10-CM | POA: Diagnosis present

## 2021-03-05 DIAGNOSIS — Z8673 Personal history of transient ischemic attack (TIA), and cerebral infarction without residual deficits: Secondary | ICD-10-CM | POA: Diagnosis not present

## 2021-03-05 DIAGNOSIS — Z66 Do not resuscitate: Secondary | ICD-10-CM | POA: Diagnosis present

## 2021-03-05 DIAGNOSIS — N1832 Chronic kidney disease, stage 3b: Secondary | ICD-10-CM | POA: Diagnosis present

## 2021-03-05 DIAGNOSIS — N183 Chronic kidney disease, stage 3 unspecified: Secondary | ICD-10-CM | POA: Diagnosis present

## 2021-03-05 DIAGNOSIS — D631 Anemia in chronic kidney disease: Secondary | ICD-10-CM | POA: Diagnosis not present

## 2021-03-05 DIAGNOSIS — Z20822 Contact with and (suspected) exposure to covid-19: Secondary | ICD-10-CM | POA: Diagnosis present

## 2021-03-05 DIAGNOSIS — F32A Depression, unspecified: Secondary | ICD-10-CM | POA: Diagnosis present

## 2021-03-05 DIAGNOSIS — S2231XA Fracture of one rib, right side, initial encounter for closed fracture: Secondary | ICD-10-CM | POA: Diagnosis present

## 2021-03-05 DIAGNOSIS — Z79899 Other long term (current) drug therapy: Secondary | ICD-10-CM

## 2021-03-05 DIAGNOSIS — N952 Postmenopausal atrophic vaginitis: Secondary | ICD-10-CM | POA: Diagnosis present

## 2021-03-05 LAB — COMPREHENSIVE METABOLIC PANEL
ALT: 11 U/L (ref 0–44)
AST: 18 U/L (ref 15–41)
Albumin: 3.7 g/dL (ref 3.5–5.0)
Alkaline Phosphatase: 59 U/L (ref 38–126)
Anion gap: 8 (ref 5–15)
BUN: 17 mg/dL (ref 8–23)
CO2: 32 mmol/L (ref 22–32)
Calcium: 8.9 mg/dL (ref 8.9–10.3)
Chloride: 95 mmol/L — ABNORMAL LOW (ref 98–111)
Creatinine, Ser: 1.04 mg/dL — ABNORMAL HIGH (ref 0.44–1.00)
GFR, Estimated: 49 mL/min — ABNORMAL LOW (ref >60)
Glucose, Bld: 151 mg/dL — ABNORMAL HIGH (ref 70–99)
Potassium: 4.8 mmol/L (ref 3.5–5.1)
Sodium: 135 mmol/L (ref 135–145)
Total Bilirubin: 0.8 mg/dL (ref 0.3–1.2)
Total Protein: 7.7 g/dL (ref 6.5–8.1)

## 2021-03-05 LAB — CBC WITH DIFFERENTIAL/PLATELET
Abs Immature Granulocytes: 0.14 10*3/uL — ABNORMAL HIGH (ref 0.00–0.07)
Basophils Absolute: 0 10*3/uL (ref 0.0–0.1)
Basophils Relative: 0 %
Eosinophils Absolute: 0.1 10*3/uL (ref 0.0–0.5)
Eosinophils Relative: 1 %
HCT: 41.6 % (ref 36.0–46.0)
Hemoglobin: 13.4 g/dL (ref 12.0–15.0)
Immature Granulocytes: 1 %
Lymphocytes Relative: 9 %
Lymphs Abs: 1.1 10*3/uL (ref 0.7–4.0)
MCH: 32 pg (ref 26.0–34.0)
MCHC: 32.2 g/dL (ref 30.0–36.0)
MCV: 99.3 fL (ref 80.0–100.0)
Monocytes Absolute: 0.7 10*3/uL (ref 0.1–1.0)
Monocytes Relative: 5 %
Neutro Abs: 10.6 10*3/uL — ABNORMAL HIGH (ref 1.7–7.7)
Neutrophils Relative %: 84 %
Platelets: 188 10*3/uL (ref 150–400)
RBC: 4.19 MIL/uL (ref 3.87–5.11)
RDW: 13.3 % (ref 11.5–15.5)
WBC: 12.7 10*3/uL — ABNORMAL HIGH (ref 4.0–10.5)
nRBC: 0 % (ref 0.0–0.2)

## 2021-03-05 LAB — LACTIC ACID, PLASMA
Lactic Acid, Venous: 1 mmol/L (ref 0.5–1.9)
Lactic Acid, Venous: 0.8 mmol/L (ref 0.5–1.9)

## 2021-03-05 LAB — D-DIMER, QUANTITATIVE: D-Dimer, Quant: 16.94 ug/mL-FEU — ABNORMAL HIGH (ref 0.00–0.50)

## 2021-03-05 LAB — BRAIN NATRIURETIC PEPTIDE: B Natriuretic Peptide: 220.9 pg/mL — ABNORMAL HIGH (ref 0.0–100.0)

## 2021-03-05 LAB — TROPONIN I (HIGH SENSITIVITY): Troponin I (High Sensitivity): 9 ng/L (ref <18)

## 2021-03-05 MED ORDER — ACETAMINOPHEN 650 MG RE SUPP
650.0000 mg | Freq: Four times a day (QID) | RECTAL | Status: DC | PRN
  Filled 2021-03-05: qty 1

## 2021-03-05 MED ORDER — SODIUM CHLORIDE 0.9 % IV BOLUS: 500.0000 mL | Freq: Once | INTRAVENOUS | Status: DC

## 2021-03-05 MED ORDER — ASPIRIN EC 81 MG PO TBEC
81.0000 mg | DELAYED_RELEASE_TABLET | Freq: Every day | ORAL | Status: DC
  Administered 2021-03-06 – 2021-03-07 (×2): 81 mg via ORAL
  Filled 2021-03-05 (×2): qty 1

## 2021-03-05 MED ORDER — IPRATROPIUM-ALBUTEROL 0.5-2.5 (3) MG/3ML IN SOLN
3.0000 mL | Freq: Three times a day (TID) | RESPIRATORY_TRACT | Status: DC
  Administered 2021-03-05 – 2021-03-07 (×5): 3 mL via RESPIRATORY_TRACT
  Filled 2021-03-05 (×6): qty 3

## 2021-03-05 MED ORDER — CARVEDILOL 6.25 MG PO TABS: 12.5000 mg | ORAL_TABLET | Freq: Every day | ORAL | Status: DC

## 2021-03-05 MED ORDER — ACETAMINOPHEN 325 MG PO TABS
650.0000 mg | ORAL_TABLET | Freq: Four times a day (QID) | ORAL | Status: DC | PRN
  Administered 2021-03-06 – 2021-03-07 (×2): 650 mg via ORAL
  Filled 2021-03-05 (×2): qty 2

## 2021-03-05 MED ORDER — ACETAMINOPHEN 500 MG PO TABS
1000.0000 mg | ORAL_TABLET | Freq: Once | ORAL | Status: DC
  Filled 2021-03-05: qty 2

## 2021-03-05 MED ORDER — HEPARIN SODIUM (PORCINE) 5000 UNIT/ML IJ SOLN
5000.0000 [IU] | Freq: Three times a day (TID) | INTRAMUSCULAR | Status: DC
  Administered 2021-03-05 – 2021-03-07 (×5): 5000 [IU] via SUBCUTANEOUS
  Filled 2021-03-05 (×5): qty 1

## 2021-03-05 MED ORDER — CITALOPRAM HYDROBROMIDE 20 MG PO TABS: 20.0000 mg | ORAL_TABLET | Freq: Every day | ORAL | Status: DC

## 2021-03-05 MED ORDER — MORPHINE SULFATE (PF) 2 MG/ML IV SOLN
2.0000 mg | Freq: Once | INTRAVENOUS | Status: AC
  Administered 2021-03-05: 2 mg via INTRAVENOUS
  Filled 2021-03-05: qty 1

## 2021-03-05 MED ORDER — CARVEDILOL 12.5 MG PO TABS
12.5000 mg | ORAL_TABLET | Freq: Two times a day (BID) | ORAL | Status: DC
  Administered 2021-03-05 – 2021-03-07 (×4): 12.5 mg via ORAL
  Filled 2021-03-05: qty 2
  Filled 2021-03-05 (×2): qty 1
  Filled 2021-03-05: qty 2

## 2021-03-05 MED ORDER — SODIUM CHLORIDE 0.9 % IV SOLN: Freq: Once | INTRAVENOUS | Status: AC

## 2021-03-05 MED ORDER — HYDRALAZINE HCL 20 MG/ML IJ SOLN: 10.0000 mg | Freq: Four times a day (QID) | INTRAMUSCULAR | Status: DC | PRN

## 2021-03-05 MED ORDER — ALBUTEROL SULFATE (2.5 MG/3ML) 0.083% IN NEBU: 2.5000 mg | INHALATION_SOLUTION | RESPIRATORY_TRACT | Status: DC | PRN

## 2021-03-05 MED ORDER — LOSARTAN POTASSIUM 25 MG PO TABS
25.0000 mg | ORAL_TABLET | Freq: Every day | ORAL | Status: DC
  Administered 2021-03-06 – 2021-03-07 (×2): 25 mg via ORAL
  Filled 2021-03-05 (×2): qty 1

## 2021-03-05 NOTE — ED Notes (Signed)
Will assess/triage pt upon arrival of family

## 2021-03-05 NOTE — ED Triage Notes (Addendum)
Ems report patient fall this morning and has increasing R sided rib pain with shallow breathing upon arrival. Pt has limited cognition due to being on hospice. Pt also hypertensive in 200s enroute. Pt is due for po htn meds

## 2021-03-05 NOTE — ED Notes (Signed)
Pt family stating that patient had had recurrent UTI and that their urine has been foul smelling recently and has been seen by PCP for UTI but PCP has stopped addressing patient UTI

## 2021-03-05 NOTE — ED Triage Notes (Signed)
Per patient daughter Daisy [name] Hidden patient slipped this morning from chair and was guarding R rib.

## 2021-03-05 NOTE — H&P (Signed)
History and Physical    Daisy [name]:[id number] DOB: November 01, 1921 DOA: 03/05/2021  PCP: Kandyce Rud, MD    Patient coming from:  Home    Chief Complaint:  Chest pain.   HPI: Daisy [name] is a 87 y.o. female seen in ed with complaints of chest pain, after fall. Pt fell while getting changed on her right side and later developed SOB and chest pain. Pt  brought to er for chest pain and found to have 4th/5th rib fracture and hypoxia.pt was comfortable on 2 L. Fell at 11:30 and brother cam to help her get off floor. When her breathing was labored they called EMS.  Pt has past medical history of hypertension, anemia, asthma, UTI, GI bleed, MRSA carrier, COPD, CVA, hip fracture, renal failure.  Patient also is allergic to codeine, Aricept, telithromycin.  ED Course:  Vitals:   03/05/21 1856 03/05/21 1900 03/05/21 1921 03/05/21 2030  BP:  (!) 175/94  (!) 179/104  Pulse: 75 73  73  Resp: (!) 25 (!) 22  (!) 23  Temp: 99.2 F (37.3 C)     SpO2: 90% 90%  95%  Weight:   83.9 kg   In the emergency room patient is sedated due to morphine  and oxygenating at 95% surgical liters a flow cannula.  CMP shows a glucose of 151 creatinine of 1.04 which seems to be patient's baseline, BNP of 220.9, lactic acid of 1.0, CBC shows a white count of 12.7, hemoglobin of 13.4, platelets of 188. Patient found to have fractures through the right lateral 4th and 5th ribs. No visible effusion or pneumothorax.  Review of Systems:  Review of Systems  Unable to perform ROS: Other (sedated by morphine due to pain.)    Past Medical History:  Diagnosis Date   Acute renal failure (HCC)    Anemia    Asthma    Cerebral artery occlusion    with cerebral infarction   Dementia (HCC)    Depression 06/05/2013   Edema    Essential hypertension, benign 06/05/2013   GERD (gastroesophageal reflux disease) 06/05/2013   Heart attack (HCC)    Osteoarthritis    Other and unspecified hyperlipidemia 06/05/2013    Paronychia, toe    Recurrent UTI    Stroke [facility])     Past Surgical History:  Procedure Laterality Date   CARDIAC SURGERY     Left BBB   FEMUR IM NAIL Right 10/01/2015   Procedure: INTRAMEDULLARY (IM) NAIL FEMORAL;  Surgeon: Juanell Fairly, MD;  Location: [facility] ORS;  Service: Orthopedics;  Laterality: Right;   KNEE SURGERY Right      reports that she has quit smoking. She has never used smokeless tobacco. She reports current alcohol use. She reports that she does not use drugs.  Allergies  Allergen Reactions   Aricept [Donepezil Hcl] Other (See Comments)    Reaction:  Nightmares    Codeine Other (See Comments)    Reaction:  Unknown    Donepezil Other (See Comments)   Telithromycin Other (See Comments)    Reaction:  Mood changes  "fearful state, not herself"    Family History  Problem Relation Age of Onset   Cancer Mother        Oral   Colon cancer Sister    Prostate cancer Son    Kidney disease Neg Hx    Bladder Cancer Neg Hx     Prior to Admission medications   Medication Sig Start Date End Date Taking? Authorizing  Provider  acetaminophen (TYLENOL) 325 MG tablet Take 650 mg by mouth every 4 (four) hours as needed for fever.    [provider]  Ascorbic Acid (VITAMIN C) 100 MG tablet Take 100 mg by mouth daily.    [provider]  aspirin EC 81 MG tablet Take 81 mg by mouth daily.    [provider]  carvedilol (COREG) 12.5 MG tablet Take 1 tablet (12.5 mg total) by mouth 2 (two) times daily with a meal. Patient taking differently: Take 12.5 mg by mouth daily.  06/09/13   Rodolph Bong, MD  citalopram (CELEXA) 10 MG tablet Take 30 mg by mouth daily. 06/26/19   [provider]  ferrous sulfate 325 (65 FE) MG EC tablet Take 1 tablet (325 mg total) by mouth 2 (two) times daily. 07/28/19 07/27/20  Chesley Noon, MD  furosemide (LASIX) 20 MG tablet Take 20 mg by mouth daily.     [provider]  ipratropium-albuterol (DUONEB)  0.5-2.5 (3) MG/3ML SOLN Take 3 mLs by nebulization 3 (three) times daily. 02/27/17   Auburn Bilberry, MD  losartan (COZAAR) 25 MG tablet Take 50 mg by mouth daily.    [provider]  montelukast (SINGULAIR) 10 MG tablet Take 10 mg by mouth at bedtime.    [provider]  nystatin cream (MYCOSTATIN) APPLY TO AFFECTED AREA TWICE A DAY 09/11/19   [provider]  simvastatin (ZOCOR) 20 MG tablet Take 20 mg by mouth daily.     [provider]  vitamin B-12 (CYANOCOBALAMIN) 1000 MCG tablet Take 1,000 mcg by mouth daily.    [provider]    Physical Exam: Vitals:   03/05/21 1856 03/05/21 1900 03/05/21 1921 03/05/21 2030  BP:  (!) 175/94  (!) 179/104  Pulse: 75 73  73  Resp: (!) 25 (!) 22  (!) 23  Temp: 99.2 F (37.3 C)     SpO2: 90% 90%  95%  Weight:   83.9 kg    Physical Exam Vitals reviewed.  Constitutional:      General: She is not in acute distress.    Appearance: She is obese. She is not ill-appearing, toxic-appearing or diaphoretic.  HENT:     Head: Normocephalic and atraumatic.     Right Ear: External ear normal.     Left Ear: External ear normal.     Nose: Nose normal.     Mouth/Throat:     Mouth: Mucous membranes are moist.  Eyes:     Extraocular Movements: Extraocular movements intact.     Pupils: Pupils are equal, round, and reactive to light.  Neck:     Vascular: No carotid bruit.  Cardiovascular:     Rate and Rhythm: Normal rate and regular rhythm.     Pulses: Normal pulses.     Heart sounds: Normal heart sounds.  Pulmonary:     Effort: Pulmonary effort is normal.     Breath sounds: Normal breath sounds.  Abdominal:     General: Bowel sounds are normal. There is no distension.     Palpations: Abdomen is soft. There is no mass.     Tenderness: There is no abdominal tenderness. There is no guarding.     Hernia: No hernia is present.  Musculoskeletal:     Right lower leg: No edema.     Left lower leg: No edema.   Skin:    General: Skin is warm.  Neurological:     General: No focal deficit present.  Mental Status: She is disoriented.  Psychiatric:        Mood and Affect: Mood normal.        Behavior: Behavior normal.    Labs on Admission: I have personally reviewed following labs and imaging studies.  No results for input(s): CKTOTAL, CKMB, TROPONINI in the last 72 hours. Lab Results  Component Value Date   WBC 12.7 (H) 03/05/2021   HGB 13.4 03/05/2021   HCT 41.6 03/05/2021   MCV 99.3 03/05/2021   PLT 188 03/05/2021    Recent Labs  Lab 03/05/21 2001  NA 135  K 4.8  CL 95*  CO2 32  BUN 17  CREATININE 1.04*  CALCIUM 8.9  PROT 7.7  BILITOT 0.8  ALKPHOS 59  ALT 11  AST 18  GLUCOSE 151*   Lab Results  Component Value Date   CHOL 227 (H) 06/05/2013   HDL 50 06/05/2013   LDLCALC 151 (H) 06/05/2013   TRIG 132 06/05/2013   No results found for: DDIMER Invalid input(s): POCBNP  Urinalysis    Component Value Date/Time   COLORURINE YELLOW 07/27/2019 2237   APPEARANCEUR CLEAR 07/27/2019 2237   APPEARANCEUR Clear 02/22/2015 1630   LABSPEC 1.020 07/27/2019 2237   LABSPEC 1.005 02/08/2014 1444   PHURINE 6.0 07/27/2019 2237   GLUCOSEU NEGATIVE 07/27/2019 2237   GLUCOSEU Negative 02/08/2014 1444   HGBUR NEGATIVE 07/27/2019 2237   BILIRUBINUR NEGATIVE 07/27/2019 2237   BILIRUBINUR Negative 02/22/2015 1630   BILIRUBINUR Negative 02/08/2014 1444   KETONESUR NEGATIVE 07/27/2019 2237   PROTEINUR NEGATIVE 07/27/2019 2237   UROBILINOGEN 0.2 06/04/2013 2110   NITRITE NEGATIVE 07/27/2019 2237   LEUKOCYTESUR SMALL (A) 07/27/2019 2237   LEUKOCYTESUR 3+ 02/08/2014 1444   COVID-19 Labs No results for input(s): DDIMER, FERRITIN, LDH, CRP in the last 72 hours. No results found for: SARSCOV2NAA  Radiological Exams on Admission: DG Ribs Unilateral W/Chest Right  Result Date: 03/05/2021 CLINICAL DATA:  Right rib pain, fall EXAM: RIGHT RIBS AND CHEST - 3+ VIEW COMPARISON:   09/01/2017 FINDINGS: Cardiomegaly. Tortuous, calcified aorta. Left base scarring or atelectasis. Right lung clear. No effusions or pneumothorax. Fractures through the right lateral 4th and 5th ribs. IMPRESSION: Fractures through the right lateral 4th and 5th ribs. No visible effusion or pneumothorax. Cardiomegaly, aortic atherosclerosis. Electronically Signed   By: Charlett Nose M.D.   On: 03/05/2021 20:02    EKG: Independently reviewed.  None     Assessment/Plan Principal Problem:   Chest pain in adult Active Problems:   Fall   Right rib fracture   Essential hypertension, benign   COPD (chronic obstructive pulmonary disease) (HCC)   CKD (chronic kidney disease), stage III (HCC)   Dementia (HCC)   Anemia in stage 3b chronic kidney disease (HCC)   Chest pain: Attribute to right sided 4/5 rib fractures.  Cont with pain control and immobility. IS when pt is awake.  Do not suspect cardiac however tni and ekg is pending.   Fall/ rt rib fracture: Pain control and IS. Ct if dimer is above age limit.   Htn: Prn hydralazine. Losartan at 27 because abnl renal function.  Stop lasix.   COPD: PRN albuterol. IS when awake every 2-3 hours.  Dementia: Monitor for any behavioral issues although none reported.  Pt is allergic to Aricept.   Anemia: Resolved since 11/18/2019.  We will monitor cbc in am.      DVT prophylaxis:  Heparin   Code Status:  DNR   Family Communication:  Chriscoe,Terrie (Daughter)  [phone] (Mobile)   Disposition Plan:  Home    Consults called:  None   Admission status: Observation.     Gertha Calkin MD [facility] [phone] How to contact the [facility] Attending or Consulting provider 7A - 7P or covering provider during after hours 7P -7A, for this patient.    Check the care team in [facility] and look for a) attending/consulting [facility] provider listed and b) the [facility] team listed Log into [email or web address] and use [facility]'s universal password  to access. If you do not have the password, please contact the hospital operator. Locate the [facility] provider you are looking for under [facility] and page to a number that you can be directly reached. If you still have difficulty reaching the provider, please page the [facility] (Director on Call) for the Hospitalists listed on amion for assistance. [email or web address] Password [email or web address] 03/05/2021, 10:09 PM

## 2021-03-05 NOTE — ED Provider Notes (Signed)
[facility] Emergency Department Provider Note  ____________________________________________   Event Date/Time   First MD Initiated Contact with Patient 03/05/21 1849     (approximate)  I have reviewed the triage vital signs and the nursing notes.   HISTORY  Chief Complaint Chest Pain    HPI Daisy [name] is a 87 y.o. female  with h/o dementia, HTN, CVA, asthma, here with R rib pain. Pt reportedly fell today while getting changed, onto her R side. She initially seemed okay. However, this afternoon, pt was apparently uncomfortable and breathing more quickly than usual. She winced with any movement and palpation of her R side. She subsequently was brought to the ED. Pt is demented, was reportedly on Hospice but is "graduating" per family. She has not been unwell otherwise. No recent med changes. There was no head trauma. History limited 2/2 dementia.        Past Medical History:  Diagnosis Date   Acute renal failure (HCC)    Anemia    Asthma    Cerebral artery occlusion    with cerebral infarction   Dementia (HCC)    Depression 06/05/2013   Edema    Essential hypertension, benign 06/05/2013   GERD (gastroesophageal reflux disease) 06/05/2013   Heart attack (HCC)    Osteoarthritis    Other and unspecified hyperlipidemia 06/05/2013   Paronychia, toe    Recurrent UTI    Stroke [facility])     Patient Active Problem List   Diagnosis Date Noted   Fall 03/05/2021   Right rib fracture 03/05/2021   Chest pain in adult 03/05/2021   Anemia in stage 3b chronic kidney disease (HCC) 08/05/2019   UTI (urinary tract infection) 09/01/2017   Diarrhea 03/12/2017   Sepsis (HCC) 02/23/2017   Rectal bleed 11/11/2015   Dementia (HCC) 11/11/2015   COPD (chronic obstructive pulmonary disease) (HCC) 11/11/2015   Dyslipidemia 11/11/2015   Infection of urinary tract 10/04/2015   Escherichia coli (E. coli) infection 10/04/2015   CKD (chronic kidney disease), stage III  (HCC) 10/04/2015   Hyperglycemia 10/04/2015   MRSA carrier 10/04/2015   Closed right hip fracture (HCC) 09/30/2015   Recurrent UTI 11/12/2014   Microscopic hematuria 11/12/2014   CVA (cerebral infarction) 06/05/2013   Other and unspecified hyperlipidemia 06/05/2013   Acute renal failure (HCC) 06/05/2013   Leukocytosis, unspecified 06/05/2013   Essential hypertension, benign 06/05/2013   Depression 06/05/2013   GERD (gastroesophageal reflux disease) 06/05/2013    Past Surgical History:  Procedure Laterality Date   CARDIAC SURGERY     Left BBB   FEMUR IM NAIL Right 10/01/2015   Procedure: INTRAMEDULLARY (IM) NAIL FEMORAL;  Surgeon: Juanell Fairly, MD;  Location: [facility] ORS;  Service: Orthopedics;  Laterality: Right;   KNEE SURGERY Right     Prior to Admission medications   Medication Sig Start Date End Date Taking? Authorizing Provider  acetaminophen (TYLENOL) 325 MG tablet Take 650 mg by mouth every 4 (four) hours as needed for fever.    [provider]  Ascorbic Acid (VITAMIN C) 100 MG tablet Take 100 mg by mouth daily.    [provider]  aspirin EC 81 MG tablet Take 81 mg by mouth daily.    [provider]  carvedilol (COREG) 12.5 MG tablet Take 1 tablet (12.5 mg total) by mouth 2 (two) times daily with a meal. Patient taking differently: Take 12.5 mg by mouth daily.  06/09/13   Rodolph Bong, MD  citalopram (CELEXA) 10  MG tablet Take 30 mg by mouth daily. 06/26/19   [provider]  ferrous sulfate 325 (65 FE) MG EC tablet Take 1 tablet (325 mg total) by mouth 2 (two) times daily. 07/28/19 07/27/20  Chesley Noon, MD  furosemide (LASIX) 20 MG tablet Take 20 mg by mouth daily.     [provider]  ipratropium-albuterol (DUONEB) 0.5-2.5 (3) MG/3ML SOLN Take 3 mLs by nebulization 3 (three) times daily. 02/27/17   Auburn Bilberry, MD  losartan (COZAAR) 25 MG tablet Take 50 mg by mouth daily.    [provider]  montelukast  (SINGULAIR) 10 MG tablet Take 10 mg by mouth at bedtime.    [provider]  nystatin cream (MYCOSTATIN) APPLY TO AFFECTED AREA TWICE A DAY 09/11/19   [provider]  simvastatin (ZOCOR) 20 MG tablet Take 20 mg by mouth daily.     [provider]  vitamin B-12 (CYANOCOBALAMIN) 1000 MCG tablet Take 1,000 mcg by mouth daily.    [provider]    Allergies Aricept [donepezil hcl], Codeine, Donepezil, and Telithromycin  Family History  Problem Relation Age of Onset   Cancer Mother        Oral   Colon cancer Sister    Prostate cancer Son    Kidney disease Neg Hx    Bladder Cancer Neg Hx     Social History Social History   Tobacco Use   Smoking status: Former   Smokeless tobacco: Never   Tobacco comments:    quit 50 + years  Substance Use Topics   Alcohol use: Yes    Alcohol/week: 0.0 standard drinks    Comment: occasionally   Drug use: No    Review of Systems  Review of Systems  Unable to perform ROS: Dementia    ____________________________________________  PHYSICAL EXAM:      VITAL SIGNS: ED Triage Vitals  Enc Vitals Group     BP --      Pulse Rate 03/05/21 1856 75     Resp 03/05/21 1856 (!) 25     Temp 03/05/21 1856 99.2 F (37.3 C)     Temp src --      SpO2 03/05/21 1856 90 %     Weight 03/05/21 1921 185 lb (83.9 kg)     Height --      Head Circumference --      Peak Flow --      Pain Score --      Pain Loc --      Pain Edu? --      Excl. in GC? --      Physical Exam Vitals and nursing note reviewed.  Constitutional:      General: She is not in acute distress.    Appearance: She is well-developed.  HENT:     Head: Normocephalic and atraumatic.  Eyes:     Conjunctiva/sclera: Conjunctivae normal.  Cardiovascular:     Rate and Rhythm: Normal rate and regular rhythm.     Heart sounds: Normal heart sounds.  Pulmonary:     Effort: Pulmonary effort is normal. No respiratory distress.     Breath sounds: Decreased  breath sounds present. No wheezing.  Chest:     Comments: Mild TTP over R lateral chest wall. No deformity. No bruising. Abdominal:     General: There is no distension.     Palpations: Abdomen is soft.     Comments: No RUQ or abd TTP. No rebound or guarding.  Musculoskeletal:     Cervical back: Neck supple.  Skin:    General: Skin is warm.     Capillary Refill: Capillary refill takes less than 2 seconds.     Findings: No rash.  Neurological:     Mental Status: She is alert and oriented to person, place, and time.     Motor: No abnormal muscle tone.      ____________________________________________   LABS (all labs ordered are listed, but only abnormal results are displayed)  Labs Reviewed  CBC WITH DIFFERENTIAL/PLATELET - Abnormal; Notable for the following components:      Result Value   WBC 12.7 (*)    Neutro Abs 10.6 (*)    Abs Immature Granulocytes 0.14 (*)    All other components within normal limits  COMPREHENSIVE METABOLIC PANEL - Abnormal; Notable for the following components:   Chloride 95 (*)    Glucose, Bld 151 (*)    Creatinine, Ser 1.04 (*)    GFR, Estimated 49 (*)    All other components within normal limits  BRAIN NATRIURETIC PEPTIDE - Abnormal; Notable for the following components:   B Natriuretic Peptide 220.9 (*)    All other components within normal limits  D-DIMER, QUANTITATIVE - Abnormal; Notable for the following components:   D-Dimer, Quant 16.94 (*)    All other components within normal limits  LACTIC ACID, PLASMA  LACTIC ACID, PLASMA  URINALYSIS, ROUTINE W REFLEX MICROSCOPIC  HEMOGLOBIN A1C  CBC  COMPREHENSIVE METABOLIC PANEL  TROPONIN I (HIGH SENSITIVITY)  TROPONIN I (HIGH SENSITIVITY)    ____________________________________________  ________________________________________  RADIOLOGY All imaging, including plain films, CT scans, and ultrasounds, independently reviewed by me, and interpretations confirmed via formal radiology  reads.  ED MD interpretation:   DG Rib Right: Right lateral 4-5th rib fx  Official radiology report(s): DG Ribs Unilateral W/Chest Right  Result Date: 03/05/2021 CLINICAL DATA:  Right rib pain, fall EXAM: RIGHT RIBS AND CHEST - 3+ VIEW COMPARISON:  09/01/2017 FINDINGS: Cardiomegaly. Tortuous, calcified aorta. Left base scarring or atelectasis. Right lung clear. No effusions or pneumothorax. Fractures through the right lateral 4th and 5th ribs. IMPRESSION: Fractures through the right lateral 4th and 5th ribs. No visible effusion or pneumothorax. Cardiomegaly, aortic atherosclerosis. Electronically Signed   By: Charlett Nose M.D.   On: 03/05/2021 20:02    ____________________________________________  PROCEDURES   Procedure(s) performed (including Critical Care):  .1-3 Lead EKG Interpretation Performed by: Shaune Pollack, MD Authorized by: Shaune Pollack, MD     Interpretation: normal     ECG rate:  70-90   ECG rate assessment: normal     Rhythm: sinus rhythm     Ectopy: none     Conduction: normal   Comments:     Indication: chest pain  ____________________________________________  INITIAL IMPRESSION / MDM / ASSESSMENT AND PLAN / ED COURSE  As part of my medical decision making, I reviewed the following data within the electronic MEDICAL RECORD NUMBER Nursing notes reviewed and incorporated, Old chart reviewed, Notes from prior ED visits, and [place] Controlled Substance Database       *Daisy [name] was evaluated in Emergency Department on 03/06/2021 for the symptoms described in the history of present illness. She was evaluated in the context of the global COVID-19 pandemic, which necessitated consideration that the patient might be at risk for infection with the SARS-CoV-2 virus that causes COVID-19. Institutional protocols and algorithms that pertain to the evaluation of patients at risk for COVID-19 are in a  state of rapid change based on information released by regulatory bodies  including the CDC and federal and state organizations. These policies and algorithms were followed during the patient's care in the ED.  Some ED evaluations and interventions may be delayed as a result of limited staffing during the pandemic.*     Medical Decision Making:  87 yo F here with right sided chest pain after fall. Pt mildly hypoxic on arrival, improved on [place]. Labs, Xr as above. CBC with mild reactive leukocytosis. BMP unremarkable. XR ribs shows 2 rib fx, no PTX. No PNA. EKG nonischemic. Suspect mild hypoxia 2/2 rib fx from fall. Discussed with pt's daughter/poa. Pt would be amenable to pain control, oxygen PRN so will admit for this, with possible palliative consult.   ____________________________________________  FINAL CLINICAL IMPRESSION(S) / ED DIAGNOSES  Final diagnoses:  Closed fracture of multiple ribs of right side, initial encounter  Hypoxia  Fall, initial encounter     MEDICATIONS GIVEN DURING THIS VISIT:  Medications  acetaminophen (TYLENOL) tablet 1,000 mg (1,000 mg Oral Not Given 03/05/21 2159)  losartan (COZAAR) tablet 25 mg (25 mg Oral Not Given 03/05/21 2246)  citalopram (CELEXA) tablet 30 mg (30 mg Oral Not Given 03/05/21 2246)  ipratropium-albuterol (DUONEB) 0.5-2.5 (3) MG/3ML nebulizer solution 3 mL (3 mLs Nebulization Given 03/05/21 2243)  aspirin EC tablet 81 mg (has no administration in time range)  albuterol (PROVENTIL) (2.5 MG/3ML) 0.083% nebulizer solution 2.5 mg (has no administration in time range)  heparin injection 5,000 Units (5,000 Units Subcutaneous Given 03/05/21 2243)  hydrALAZINE (APRESOLINE) injection 10 mg (has no administration in time range)  acetaminophen (TYLENOL) tablet 650 mg (has no administration in time range)    Or  acetaminophen (TYLENOL) suppository 650 mg (has no administration in time range)  carvedilol (COREG) tablet 12.5 mg (12.5 mg Oral Given 03/05/21 2243)  morphine 2 MG/ML injection 2 mg (2 mg Intravenous Given  03/05/21 2035)  0.9 %  sodium chloride infusion ( Intravenous New Bag/Given 03/05/21 2048)     ED Discharge Orders     None        Note:  This document was prepared using Dragon voice recognition software and may include unintentional dictation errors.   Shaune Pollack, MD 03/06/21 0120

## 2021-03-05 NOTE — ED Notes (Signed)
Pt cleaned and placed on pure wick for UA. Pt resting in bed.

## 2021-03-05 NOTE — ED Notes (Signed)
3L [place] placed on pt for 02sat below 90%

## 2021-03-05 NOTE — ED Notes (Signed)
Per pt family request patient is sleeping and requesting to give PM meds when patient wakes up. Pt resting in bed. Visitor left for errands and will return.

## 2021-03-05 NOTE — ED Notes (Signed)
Spoke with lab about lactic result missing. Lab to look for tube and run test.

## 2021-03-05 NOTE — ED Notes (Signed)
Pt on 2L [place].  

## 2021-03-06 ENCOUNTER — Other Ambulatory Visit: Payer: Self-pay

## 2021-03-06 DIAGNOSIS — Z20822 Contact with and (suspected) exposure to covid-19: Secondary | ICD-10-CM | POA: Diagnosis present

## 2021-03-06 DIAGNOSIS — K219 Gastro-esophageal reflux disease without esophagitis: Secondary | ICD-10-CM | POA: Diagnosis present

## 2021-03-06 DIAGNOSIS — E785 Hyperlipidemia, unspecified: Secondary | ICD-10-CM | POA: Diagnosis present

## 2021-03-06 DIAGNOSIS — Z66 Do not resuscitate: Secondary | ICD-10-CM | POA: Diagnosis present

## 2021-03-06 DIAGNOSIS — S2249XA Multiple fractures of ribs, unspecified side, initial encounter for closed fracture: Secondary | ICD-10-CM | POA: Diagnosis present

## 2021-03-06 DIAGNOSIS — D631 Anemia in chronic kidney disease: Secondary | ICD-10-CM | POA: Diagnosis present

## 2021-03-06 DIAGNOSIS — Z6834 Body mass index (BMI) 34.0-34.9, adult: Secondary | ICD-10-CM | POA: Diagnosis not present

## 2021-03-06 DIAGNOSIS — Y92009 Unspecified place in unspecified non-institutional (private) residence as the place of occurrence of the external cause: Secondary | ICD-10-CM | POA: Diagnosis not present

## 2021-03-06 DIAGNOSIS — I252 Old myocardial infarction: Secondary | ICD-10-CM | POA: Diagnosis not present

## 2021-03-06 DIAGNOSIS — I129 Hypertensive chronic kidney disease with stage 1 through stage 4 chronic kidney disease, or unspecified chronic kidney disease: Secondary | ICD-10-CM | POA: Diagnosis present

## 2021-03-06 DIAGNOSIS — R0902 Hypoxemia: Secondary | ICD-10-CM | POA: Diagnosis present

## 2021-03-06 DIAGNOSIS — R079 Chest pain, unspecified: Secondary | ICD-10-CM | POA: Diagnosis not present

## 2021-03-06 DIAGNOSIS — Z888 Allergy status to other drugs, medicaments and biological substances status: Secondary | ICD-10-CM | POA: Diagnosis not present

## 2021-03-06 DIAGNOSIS — Z87440 Personal history of urinary (tract) infections: Secondary | ICD-10-CM | POA: Diagnosis not present

## 2021-03-06 DIAGNOSIS — F0390 Unspecified dementia without behavioral disturbance: Secondary | ICD-10-CM | POA: Diagnosis present

## 2021-03-06 DIAGNOSIS — E669 Obesity, unspecified: Secondary | ICD-10-CM | POA: Diagnosis present

## 2021-03-06 DIAGNOSIS — Z8673 Personal history of transient ischemic attack (TIA), and cerebral infarction without residual deficits: Secondary | ICD-10-CM | POA: Diagnosis not present

## 2021-03-06 DIAGNOSIS — S2241XA Multiple fractures of ribs, right side, initial encounter for closed fracture: Secondary | ICD-10-CM | POA: Diagnosis present

## 2021-03-06 DIAGNOSIS — Z79899 Other long term (current) drug therapy: Secondary | ICD-10-CM | POA: Diagnosis not present

## 2021-03-06 DIAGNOSIS — F419 Anxiety disorder, unspecified: Secondary | ICD-10-CM | POA: Diagnosis present

## 2021-03-06 DIAGNOSIS — Z885 Allergy status to narcotic agent status: Secondary | ICD-10-CM | POA: Diagnosis not present

## 2021-03-06 DIAGNOSIS — F32A Depression, unspecified: Secondary | ICD-10-CM | POA: Diagnosis present

## 2021-03-06 DIAGNOSIS — J449 Chronic obstructive pulmonary disease, unspecified: Secondary | ICD-10-CM | POA: Diagnosis present

## 2021-03-06 DIAGNOSIS — Z7401 Bed confinement status: Secondary | ICD-10-CM | POA: Diagnosis not present

## 2021-03-06 DIAGNOSIS — Z7982 Long term (current) use of aspirin: Secondary | ICD-10-CM | POA: Diagnosis not present

## 2021-03-06 DIAGNOSIS — W19XXXA Unspecified fall, initial encounter: Secondary | ICD-10-CM | POA: Diagnosis present

## 2021-03-06 DIAGNOSIS — N1832 Chronic kidney disease, stage 3b: Secondary | ICD-10-CM | POA: Diagnosis present

## 2021-03-06 LAB — COMPREHENSIVE METABOLIC PANEL
ALT: 10 U/L (ref 0–44)
AST: 14 U/L — ABNORMAL LOW (ref 15–41)
Albumin: 3.5 g/dL (ref 3.5–5.0)
Alkaline Phosphatase: 54 U/L (ref 38–126)
Anion gap: 8 (ref 5–15)
BUN: 15 mg/dL (ref 8–23)
CO2: 29 mmol/L (ref 22–32)
Calcium: 8.5 mg/dL — ABNORMAL LOW (ref 8.9–10.3)
Chloride: 98 mmol/L (ref 98–111)
Creatinine, Ser: 0.87 mg/dL (ref 0.44–1.00)
GFR, Estimated: >60 mL/min (ref >60)
Glucose, Bld: 118 mg/dL — ABNORMAL HIGH (ref 70–99)
Potassium: 4 mmol/L (ref 3.5–5.1)
Sodium: 135 mmol/L (ref 135–145)
Total Bilirubin: 0.9 mg/dL (ref 0.3–1.2)
Total Protein: 6.8 g/dL (ref 6.5–8.1)

## 2021-03-06 LAB — CBC
HCT: 38.6 % (ref 36.0–46.0)
Hemoglobin: 12.6 g/dL (ref 12.0–15.0)
MCH: 32.5 pg (ref 26.0–34.0)
MCHC: 32.6 g/dL (ref 30.0–36.0)
MCV: 99.5 fL (ref 80.0–100.0)
Platelets: 168 10*3/uL (ref 150–400)
RBC: 3.88 MIL/uL (ref 3.87–5.11)
RDW: 13.3 % (ref 11.5–15.5)
WBC: 10.5 10*3/uL (ref 4.0–10.5)
nRBC: 0 % (ref 0.0–0.2)

## 2021-03-06 LAB — URINALYSIS, ROUTINE W REFLEX MICROSCOPIC
Bilirubin Urine: NEGATIVE
Glucose, UA: NEGATIVE mg/dL
Hgb urine dipstick: NEGATIVE
Ketones, ur: NEGATIVE mg/dL
Nitrite: NEGATIVE
Protein, ur: NEGATIVE mg/dL
Specific Gravity, Urine: 1.014 (ref 1.005–1.030)
WBC, UA: >50 WBC/hpf — ABNORMAL HIGH (ref 0–5)
pH: 6 (ref 5.0–8.0)

## 2021-03-06 LAB — TROPONIN I (HIGH SENSITIVITY): Troponin I (High Sensitivity): 12 ng/L (ref <18)

## 2021-03-06 LAB — RESP PANEL BY RT-PCR (FLU A&B, COVID) ARPGX2
Influenza A by PCR: NEGATIVE
Influenza B by PCR: NEGATIVE
SARS Coronavirus 2 by RT PCR: NEGATIVE

## 2021-03-06 LAB — HEMOGLOBIN A1C
Hgb A1c MFr Bld: 5.7 % — ABNORMAL HIGH (ref 4.8–5.6)
Mean Plasma Glucose: 117 mg/dL

## 2021-03-06 MED ORDER — LIDOCAINE 5 % EX PTCH
1.0000 | MEDICATED_PATCH | CUTANEOUS | Status: DC
  Administered 2021-03-06 – 2021-03-07 (×2): 1 via TRANSDERMAL
  Filled 2021-03-06 (×2): qty 1

## 2021-03-06 MED ORDER — CITALOPRAM HYDROBROMIDE 20 MG PO TABS
20.0000 mg | ORAL_TABLET | Freq: Every day | ORAL | Status: DC
  Administered 2021-03-06 – 2021-03-07 (×2): 20 mg via ORAL
  Filled 2021-03-06 (×2): qty 1

## 2021-03-06 MED ORDER — MONTELUKAST SODIUM 10 MG PO TABS
10.0000 mg | ORAL_TABLET | Freq: Every day | ORAL | Status: DC
  Administered 2021-03-06: 10 mg via ORAL
  Filled 2021-03-06: qty 1

## 2021-03-06 NOTE — ED Notes (Signed)
Informed RN bed assigned 

## 2021-03-06 NOTE — Progress Notes (Addendum)
[facility] ED37 [facility]) Hospitalized Hospice Patient Visit    Daisy [name] is a current hospice patient with a terminal diagnosis of hypertensive heart and chronic kidney disease with heart failure. Patient was transported to [facility] via EMS at family request with c/o shortness of breath and chest pain s/p fall. Admitted to [facility] with diagnosis of chest pain and right rib fracture. Per Dr. Dan Humphreys with [facility], this is a related hospital admission.    Visited patient at bedside. Daughter present. Patient resting with eyes closed. Awakens to voice. Daughter states no needs that are not currently med by hospital care team. Plan for possible discharge on 10.18.22. Report exchanged with hospital care team.   Patient remains inpatient appropriate in order to monitor respiratory status s/p rib fracture and monitor effectiveness of current symptom management.   V/S: 97.9, 169/84, 64, 19, 92% sats on RA   I/O:  Not documented   Abnormal Labs:  Glucose: 118 (H) Calcium: 8.5 (L) AST: 14 (L) D-Dimer, Quant: 16.94 (H)  Diagnostics:  CLINICAL DATA:  Right rib pain, fall   EXAM: RIGHT RIBS AND CHEST - 3+ VIEW    IMPRESSION: Fractures through the right lateral 4th and 5th ribs. No visible effusion or pneumothorax.   Cardiomegaly, aortic atherosclerosis.   IV/PRN:  Morphine 2 mg one time dose, NS 500 ml bolus x 1   Problem List: Principal Problem:   Chest pain in adult Active Problems:   Fall   Right rib fracture   Essential hypertension, benign   COPD (chronic obstructive pulmonary disease) (HCC)   CKD (chronic kidney disease), stage III (HCC)   Dementia (HCC)   Anemia in stage 3b chronic kidney disease (HCC)   Chest pain: Attribute to right sided 4/5 rib fractures.  Cont with pain control and immobility. IS when pt is awake.  Do not suspect cardiac however tni and ekg is pending.    Fall/ rt rib fracture: Pain control and IS. CT if dimer is above age  limit.    Htn: Prn hydralazine. Losartan at 25 because abnl renal function.  Stop lasix.    COPD: PRN albuterol. IS when awake every 2-3 hours.   Dementia: Monitor for any behavioral issues although none reported.  Pt is allergic to Aricept.    Anemia: Resolved since 11/18/2019.  We will monitor cbc in am.   Discharge Planning: Ongoing. Plan to discharge home with hospice once medically stable.   Family Contact: Spoke with daughter at bedside   IDT: Updated   Goals of Care: Clear. DNR, treat the treatable.  Medication list and transfer summary placed on shadow chart.   Please do not hesitate to call with any hospice related questions or concerns.    Thank you,    Bobbie "Einar Gip, RN, BSN [facility] Liaison [phone]

## 2021-03-06 NOTE — Progress Notes (Addendum)
Good, PROGRESS NOTE    Daisy [name]  DUK:[id number] DOB: Dec 03, 1921 DOA: 03/05/2021 PCP: Kandyce Rud, MD    Brief Narrative:  Daisy [name] is a 87 year old female with past medical history significant for essential hypertension, COPD/asthma, CVA, recurrent UTI, who presented to [facility] ED on 10/16 following fall at home with associated shortness of breath and right-sided chest wall pain.  Patient reportedly fell while getting changed onto her right side.  No loss of consciousness was reported.  She is reported to be breathing more quickly and shallow than usual with wincing of any type of movement on palpation of her right side.  Patient currently followed by hospice outpatient but is "graduating" per family.  No recent medication changes and has not been unwell otherwise.  In the ED, temperature 99.2 F, HR 75, RR 25, BP 175/94, SPO2 88% on room air. Sodium 135, potassium 4.8, chloride 95, CO2 32, BUN 17, creatinine 1.04, glucose 151, AST 18, ALT 11, BNP 220.9, lactic acid 1.0.  WBC 12.7, hemoglobin 13.4, platelets 188.  D-dimer elevated 16.94, but but age-adjusted is within normal limits.  Rib x-ray with fractures through right lateral fourth/fifth ribs, no visible effusion or pneumothorax.  EDP consulted hospitalist service for further evaluation management given her hypoxia, pain and possible concern for underlying infection with UTI given her history.   Assessment & Plan:   Principal Problem:   Chest pain in adult Active Problems:   Essential hypertension, benign   CKD (chronic kidney disease), stage III (HCC)   Dementia (HCC)   COPD (chronic obstructive pulmonary disease) (HCC)   Anemia in stage 3b chronic kidney disease (HCC)   Fall   Right rib fracture   Rib fractures   Right rib fracture Patient presenting to the ED after fall while getting changed yesterday.  No loss of consciousness.  Patient is afebrile.  X-ray ribs notable for right lateral fourth/fifth  rib fractures without effusion or pneumothorax.  Patient was noted to be hypoxic satting 88% on room air, not oxygen dependent at baseline. --Lidocaine patch --Tylenol 650 mg p.o. every 6 hours as needed mild pain --Incentive spirometry --Supportive care  Fall Patient reportedly fell at home while being changed yesterday.  No loss of consciousness.  Patient is bedbound at baseline with 24-hour care at home per family.  Currently on hospice outpatient.  Family declines PT/OT evaluation.  Was at home.  Anticipate discharge back home with hospice and continued care by family members. --Fall precautions --Check UA  Essential hypertension --Carvedilol 12.5 mg p.o. twice daily --Losartan 25 mg p.o. daily --Aspirin 81 mg p.o. daily  Depression/anxiety: --Celexa 30 mg p.o. daily  COPD/asthma: --Singulair 10 mg p.o. daily --Albuterol/duo nebs as needed --Continue supplemental oxygen, maintain SPO2 greater than 88%; likely will need home oxygen as noted to desaturate to 86/87% at rest in the room this morning. --See for home health oxygen  Dementia: Mental status currently at baseline per daughter.  Is nonambulatory/bedbound at baseline.  Lives at home with multiple family members with 24/7 care and currently on home hospice.  Daughter reports patient is "graduating" from hospice.  But currently followed by [facility].     DVT prophylaxis: heparin injection 5,000 Units Start: 03/05/21 2200 SCDs Start: 03/05/21 2136   Code Status: DNR Family Communication: Updated patient's daughter present at bedside this morning  Disposition Plan:  Level of care: Med-Surg Status is: Inpatient  Remains inpatient appropriate because: Fall, remains hypoxic and not oxygen dependent at baseline likely  secondary to splinting from rib fractures versus underlying COPD.  Will need home oxygen set up.   Consultants:  None  Procedures:  None  Antimicrobials:  None   Subjective: Patient seen  examined bedside, resting comfortably.  Pleasantly confused.  Daughter present at bedside and updated on plan of care.  Remains hypoxic when attempted to wean oxygen this morning down to 86 and 87% at rest.  Will need home O2, daughter appreciative of care.  No other questions or concerns at this time.  Unable to obtain further ROS from patient due to her underlying dementia.  No acute concerns this morning per nursing staff.  Objective: Vitals:   03/06/21 0832 03/06/21 0833 03/06/21 0834 03/06/21 0835  BP:      Pulse: 63 64 64   Resp: 17 (!) 21 19   Temp:      TempSrc:      SpO2: 96% 95% 92% (!) 87%  Weight:       No intake or output data in the 24 hours ending 03/06/21 1033 Filed Weights   03/05/21 1921  Weight: 83.9 kg    Examination:  General exam: Appears calm and comfortable, pleasantly confused Respiratory system: Clear to auscultation. Respiratory effort normal.  On 1 L nasal cannula Cardiovascular system: S1 & S2 heard, RRR. No JVD, murmurs, rubs, gallops or clicks. No pedal edema. Gastrointestinal system: Abdomen is nondistended, soft and nontender. No organomegaly or masses felt. Normal bowel sounds heard. Central nervous system: Alert, not oriented to person/place/time/situation. No focal neurological deficits. Extremities: Symmetric 5 x 5 power. Skin: No rashes, lesions or ulcers Psychiatry: Judgement and insight appear poor. Mood & affect appropriate.     Data Reviewed: I have personally reviewed following labs and imaging studies  CBC: Recent Labs  Lab 03/05/21 2001 03/06/21 0622  WBC 12.7* 10.5  NEUTROABS 10.6*  --   HGB 13.4 12.6  HCT 41.6 38.6  MCV 99.3 99.5  PLT 188 168   Basic Metabolic Panel: Recent Labs  Lab 03/05/21 2001 03/06/21 0622  NA 135 135  K 4.8 4.0  CL 95* 98  CO2 32 29  GLUCOSE 151* 118*  BUN 17 15  CREATININE 1.04* 0.87  CALCIUM 8.9 8.5*   GFR: CrCl cannot be calculated (Unknown ideal weight.). Liver Function  Tests: Recent Labs  Lab 03/05/21 2001 03/06/21 0622  AST 18 14*  ALT 11 10  ALKPHOS 59 54  BILITOT 0.8 0.9  PROT 7.7 6.8  ALBUMIN 3.7 3.5   No results for input(s): LIPASE, AMYLASE in the last 168 hours. No results for input(s): AMMONIA in the last 168 hours. Coagulation Profile: No results for input(s): INR, PROTIME in the last 168 hours. Cardiac Enzymes: No results for input(s): CKTOTAL, CKMB, CKMBINDEX, TROPONINI in the last 168 hours. BNP (last 3 results) No results for input(s): PROBNP in the last 8760 hours. HbA1C: No results for input(s): HGBA1C in the last 72 hours. CBG: No results for input(s): GLUCAP in the last 168 hours. Lipid Profile: No results for input(s): CHOL, HDL, LDLCALC, TRIG, CHOLHDL, LDLDIRECT in the last 72 hours. Thyroid Function Tests: No results for input(s): TSH, T4TOTAL, FREET4, T3FREE, THYROIDAB in the last 72 hours. Anemia Panel: No results for input(s): VITAMINB12, FOLATE, FERRITIN, TIBC, IRON, RETICCTPCT in the last 72 hours. Sepsis Labs: Recent Labs  Lab 03/05/21 2002 03/05/21 2237  LATICACIDVEN 1.0 0.8    No results found for this or any previous visit (from the past 240 hour(s)).  Radiology Studies: DG Ribs Unilateral W/Chest Right  Result Date: 03/05/2021 CLINICAL DATA:  Right rib pain, fall EXAM: RIGHT RIBS AND CHEST - 3+ VIEW COMPARISON:  09/01/2017 FINDINGS: Cardiomegaly. Tortuous, calcified aorta. Left base scarring or atelectasis. Right lung clear. No effusions or pneumothorax. Fractures through the right lateral 4th and 5th ribs. IMPRESSION: Fractures through the right lateral 4th and 5th ribs. No visible effusion or pneumothorax. Cardiomegaly, aortic atherosclerosis. Electronically Signed   By: Charlett Nose M.D.   On: 03/05/2021 20:02        Scheduled Meds:  acetaminophen  1,000 mg Oral Once   aspirin EC  81 mg Oral Daily   carvedilol  12.5 mg Oral BID WC   citalopram  30 mg Oral Daily   heparin  5,000 Units  Subcutaneous Q8H   ipratropium-albuterol  3 mL Nebulization TID   lidocaine  1 patch Transdermal Q24H   losartan  25 mg Oral Daily   Continuous Infusions:   LOS: 0 days    Time spent: 39 minutes spent on chart review, discussion with nursing staff, consultants, updating family and interview/physical exam; more than 50% of that time was spent in counseling and/or coordination of care.    Alvira Philips [place], DO [facility] Available via Epic secure chat 7am-7pm After these hours, please refer to coverage provider listed on [email or web address] 03/06/2021, 10:33 AM

## 2021-03-07 MED ORDER — LIDOCAINE 5 % EX PTCH: 1.0000 | MEDICATED_PATCH | CUTANEOUS | 2 refills | Status: AC

## 2021-03-07 MED ORDER — CEFDINIR 300 MG PO CAPS: 300.0000 mg | ORAL_CAPSULE | Freq: Two times a day (BID) | ORAL | 0 refills | Status: AC

## 2021-03-07 NOTE — TOC Initial Note (Signed)
Transition of Care [facility]) - Initial/Assessment Note    Patient Details  Name: Daisy [name] MRN: [id number] Date of Birth: 1922-01-19  [facility] CM/SW Contact:    Chapman Fitch, RN Phone Number: 03/07/2021, 12:46 PM  Clinical Narrative:                    Patient to discharge today with resumption of hospice through [facility].  Gwynneth Aliment notified of discharge.  She has arranged EMS transport and confirmed home O2 has been delivered.  EMS packet and signed DNR on chart      Patient Goals and CMS Choice        Expected Discharge Plan and Services           Expected Discharge Date: 03/07/21                                    Prior Living Arrangements/Services                       Activities of Daily Living Home Assistive Devices/Equipment: Wheelchair ADL Screening (condition at time of admission) Patient's cognitive ability adequate to safely complete daily activities?: No Is the patient deaf or have difficulty hearing?: Yes Does the patient have difficulty seeing, even when wearing glasses/contacts?: No Does the patient have difficulty concentrating, remembering, or making decisions?: Yes Patient able to express need for assistance with ADLs?: Yes Does the patient have difficulty dressing or bathing?: Yes Independently performs ADLs?: No Does the patient have difficulty walking or climbing stairs?: Yes Weakness of Legs: Both Weakness of Arms/Hands: Both  Permission Sought/Granted                  Emotional Assessment              Admission diagnosis:  Rib fractures [S22.49XA] Fall [W19.XXXA] Hypoxia [R09.02] Fall, initial encounter [id number]] Closed fracture of multiple ribs of right side, initial encounter [S22.41XA] Patient Active Problem List   Diagnosis Date Noted   Rib fractures 03/06/2021   Fall 03/05/2021   Right rib fracture 03/05/2021   Chest pain in adult 03/05/2021   Anemia in  stage 3b chronic kidney disease (HCC) 08/05/2019   UTI (urinary tract infection) 09/01/2017   Diarrhea 03/12/2017   Sepsis (HCC) 02/23/2017   Rectal bleed 11/11/2015   Dementia (HCC) 11/11/2015   COPD (chronic obstructive pulmonary disease) (HCC) 11/11/2015   Dyslipidemia 11/11/2015   Infection of urinary tract 10/04/2015   Escherichia coli (E. coli) infection 10/04/2015   CKD (chronic kidney disease), stage III (HCC) 10/04/2015   Hyperglycemia 10/04/2015   MRSA carrier 10/04/2015   Closed right hip fracture (HCC) 09/30/2015   Recurrent UTI 11/12/2014   Microscopic hematuria 11/12/2014   CVA (cerebral infarction) 06/05/2013   Other and unspecified hyperlipidemia 06/05/2013   Acute renal failure (HCC) 06/05/2013   Leukocytosis, unspecified 06/05/2013   Essential hypertension, benign 06/05/2013   Depression 06/05/2013   GERD (gastroesophageal reflux disease) 06/05/2013   PCP:  Kandyce Rud, MD Pharmacy:   [facility] [phone] [place], [place] - [address] [place] [address] Phone: [phone] Fax: [phone]     Social Determinants of Health (SDOH) Interventions    Readmission Risk Interventions No flowsheet data found.

## 2021-03-07 NOTE — Discharge Summary (Signed)
Physician Discharge Summary  Daisy [name]:[id number] DOB: 11-26-1921 DOA: 03/05/2021  PCP: Kandyce Rud, MD  Admit date: 03/05/2021 Discharge date: 03/07/2021  Admitted From: Home Disposition: Home with hospice  Recommendations for Outpatient Follow-up:  Follow up with PCP/hospice provider on discharge Prescribe lidocaine patch for right rib fractures Cefdinir 300 mg p.o. twice daily x5 days for UTI Follow-up urine culture which was pending at time of discharge  Home Health: No Equipment/Devices: Oxygen  Discharge Condition: Stable CODE STATUS: DNR Diet recommendation: Heart healthy diet  History of present illness:  Daisy [name] is a 87 year old female with past medical history significant for essential hypertension, COPD/asthma, CVA, recurrent UTI, who presented to [facility] ED on 10/16 following fall at home with associated shortness of breath and right-sided chest wall pain.  Patient reportedly fell while getting changed onto her right side.  No loss of consciousness was reported.  She is reported to be breathing more quickly and shallow than usual with wincing of any type of movement on palpation of her right side.   Patient currently followed by hospice outpatient but is "graduating" per family.  No recent medication changes and has not been unwell otherwise.   In the ED, temperature 99.2 F, HR 75, RR 25, BP 175/94, SPO2 88% on room air. Sodium 135, potassium 4.8, chloride 95, CO2 32, BUN 17, creatinine 1.04, glucose 151, AST 18, ALT 11, BNP 220.9, lactic acid 1.0.  WBC 12.7, hemoglobin 13.4, platelets 188.  D-dimer elevated 16.94, but but age-adjusted is within normal limits.  Rib x-ray with fractures through right lateral fourth/fifth ribs, no visible effusion or pneumothorax.  EDP consulted hospitalist service for further evaluation management given her hypoxia, pain and possible concern for underlying infection with UTI given her history.  Hospital  course:  Right rib fracture Patient presenting to the ED after fall while getting changed yesterday.  No loss of consciousness.  Patient is afebrile.  X-ray ribs notable for right lateral fourth/fifth rib fractures without effusion or pneumothorax.  Patient was noted to be hypoxic satting 88% on room air, not oxygen dependent at baseline.  Started on lidocaine patch Tylenol as needed.  Continues to desaturate to 87/88% on room air requiring 1-2 L nasal cannula to maintain SPO2 likely secondary to rib fractures versus underlying COPD.  Discharge back to hospice care.   Fall Patient reportedly fell at home while being changed yesterday.  No loss of consciousness.  Patient is bedbound at baseline with 24-hour care at home per family.  Currently on hospice outpatient.  Family declines PT/OT evaluation.  Was at home.  Anticipate discharge back home with hospice and continued care by family members.  UTI Patient with history of recurrent UTIs; Citrobacter and E. coli.  Underlying dementia.  Urinalysis with large leukocytes, negative nitrite, few bacteria and greater than 50 WBCs.  Discharging on cefdinir 300 mg p.o. twice daily x5 days.  Follow-up urine culture was pending at time of discharge.   Essential hypertension Carvedilol 12.5 mg p.o. twice daily, Losartan 25 mg p.o. daily, furosemide. Aspirin 81 mg p.o. daily   Depression/anxiety: Celexa 30 mg p.o. daily   COPD/asthma: Singulair 10 mg p.o. daily, Albuterol/duo nebs as needed.  Discharging on oxygen given the saturation while at rest to 87-88%.   Dementia: Mental status currently at baseline per daughter.  Is nonambulatory/bedbound at baseline.  Lives at home with multiple family members with 24/7 care and currently on home hospice.  Daughter reports patient is "graduating" from  hospice.  But currently followed by [facility].    Discharge Diagnoses:  Principal Problem:   Chest pain in adult Active Problems:   Essential hypertension,  benign   CKD (chronic kidney disease), stage III (HCC)   Dementia (HCC)   COPD (chronic obstructive pulmonary disease) (HCC)   Anemia in stage 3b chronic kidney disease (HCC)   Fall   Right rib fracture   Rib fractures    Discharge Instructions  Discharge Instructions     Call MD for:  difficulty breathing, headache or visual disturbances   Complete by: As directed    Call MD for:  extreme fatigue   Complete by: As directed    Call MD for:  persistant dizziness or light-headedness   Complete by: As directed    Call MD for:  persistant nausea and vomiting   Complete by: As directed    Call MD for:  severe uncontrolled pain   Complete by: As directed    Call MD for:  temperature >100.4   Complete by: As directed    Diet - low sodium heart healthy   Complete by: As directed    Increase activity slowly   Complete by: As directed       Allergies as of 03/07/2021       Reactions   Aricept [donepezil Hcl] Other (See Comments)   Reaction:  Nightmares    Codeine Other (See Comments)   Reaction:  Unknown    Donepezil Other (See Comments)   Telithromycin Other (See Comments)   Reaction:  Mood changes  "fearful state, not herself"        Medication List     TAKE these medications    acetaminophen 325 MG tablet Commonly known as: TYLENOL Take 650 mg by mouth every 4 (four) hours as needed for fever.   aspirin EC 81 MG tablet Take 81 mg by mouth daily.   carvedilol 12.5 MG tablet Commonly known as: COREG Take 1 tablet (12.5 mg total) by mouth 2 (two) times daily with a meal. What changed: when to take this   cefdinir 300 MG capsule Commonly known as: OMNICEF Take 1 capsule (300 mg total) by mouth 2 (two) times daily for 5 days.   citalopram 10 MG tablet Commonly known as: CELEXA Take 20 mg by mouth daily.   ferrous sulfate 325 (65 FE) MG EC tablet Take 1 tablet (325 mg total) by mouth 2 (two) times daily.   furosemide 20 MG tablet Commonly known as:  LASIX Take 20 mg by mouth daily.   ipratropium-albuterol 0.5-2.5 (3) MG/3ML Soln Commonly known as: DUONEB Take 3 mLs by nebulization 3 (three) times daily.   lidocaine 5 % Commonly known as: LIDODERM Place 1 patch onto the skin daily. Remove & Discard patch within 12 hours or as directed by MD Start taking on: March 08, 2021   losartan 25 MG tablet Commonly known as: COZAAR Take 50 mg by mouth daily.   montelukast 10 MG tablet Commonly known as: SINGULAIR Take 10 mg by mouth at bedtime.   nystatin cream Commonly known as: MYCOSTATIN APPLY TO AFFECTED AREA TWICE A DAY   simvastatin 20 MG tablet Commonly known as: ZOCOR Take 20 mg by mouth daily.   vitamin B-12 1000 MCG tablet Commonly known as: CYANOCOBALAMIN Take 1,000 mcg by mouth daily.   vitamin C 100 MG tablet Take 100 mg by mouth daily.               Durable  Medical Equipment  (From admission, onward)           Start     Ordered   03/06/21 0916  For home use only DME oxygen  Once       Question Answer Comment  Length of Need 6 Months   Mode or (Route) Nasal cannula   Liters per Minute 1   Frequency Continuous (stationary and portable oxygen unit needed)   Oxygen conserving device Yes   Oxygen delivery system Gas      03/06/21 0915            Allergies  Allergen Reactions   Aricept [Donepezil Hcl] Other (See Comments)    Reaction:  Nightmares    Codeine Other (See Comments)    Reaction:  Unknown    Donepezil Other (See Comments)   Telithromycin Other (See Comments)    Reaction:  Mood changes  "fearful state, not herself"    Consultations: None   Procedures/Studies: DG Ribs Unilateral W/Chest Right  Result Date: 03/05/2021 CLINICAL DATA:  Right rib pain, fall EXAM: RIGHT RIBS AND CHEST - 3+ VIEW COMPARISON:  09/01/2017 FINDINGS: Cardiomegaly. Tortuous, calcified aorta. Left base scarring or atelectasis. Right lung clear. No effusions or pneumothorax. Fractures through the  right lateral 4th and 5th ribs. IMPRESSION: Fractures through the right lateral 4th and 5th ribs. No visible effusion or pneumothorax. Cardiomegaly, aortic atherosclerosis. Electronically Signed   By: Charlett Nose M.D.   On: 03/05/2021 20:02     Subjective: Patient seen examined bedside, resting comfortably.  Daughter present.  No specific complaints this morning but remains pleasantly confused.  Discussed with daughter discharging home to return with hospice with submental oxygen, lidocaine patch and short course of antibiotics for likely UTI.  No other questions or concerns at this time.  Appreciative of all the care her mother was received at [facility].  No acute concerns overnight per nursing staff.  Discharge Exam: Vitals:   03/07/21 0733 03/07/21 0817  BP:  139/67  Pulse:  66  Resp:  18  Temp:  98.6 F (37 C)  SpO2: 96% 95%   Vitals:   03/06/21 2048 03/07/21 0450 03/07/21 0733 03/07/21 0817  BP: 135/73 (!) 143/81  139/67  Pulse: 80 70  66  Resp: 20 18  18   Temp: 98 F (36.7 C) (!) 97.3 F (36.3 C)  98.6 F (37 C)  TempSrc: Oral Oral  Oral  SpO2: 93% 95% 96% 95%  Weight:      Height:        General: Pt is alert, awake, not in acute distress, pleasantly confused Cardiovascular: RRR, S1/S2 +, no rubs, no gallops Respiratory: CTA bilaterally, no wheezing, no rhonchi 2 L nasal cannula Abdominal: Soft, NT, ND, bowel sounds + Extremities: no edema, no cyanosis    The results of significant diagnostics from this hospitalization (including imaging, microbiology, ancillary and laboratory) are listed below for reference.     Microbiology: Recent Results (from the past 240 hour(s))  Resp Panel by RT-PCR (Flu A&B, Covid) Nasopharyngeal Swab     Status: None   Collection Time: 03/06/21 12:13 PM   Specimen: Nasopharyngeal Swab; Nasopharyngeal(NP) swabs in vial transport medium  Result Value Ref Range Status   SARS Coronavirus 2 by RT PCR NEGATIVE NEGATIVE Final    Comment:  (NOTE) SARS-CoV-2 target nucleic acids are NOT DETECTED.  The SARS-CoV-2 RNA is generally detectable in upper respiratory specimens during the acute phase of infection. The lowest concentration of SARS-CoV-2 viral  copies this assay can detect is 138 copies/mL. A negative result does not preclude SARS-Cov-2 infection and should not be used as the sole basis for treatment or other patient management decisions. A negative result may occur with  improper specimen collection/handling, submission of specimen other than nasopharyngeal swab, presence of viral mutation(s) within the areas targeted by this assay, and inadequate number of viral copies(<138 copies/mL). A negative result must be combined with clinical observations, patient history, and epidemiological information. The expected result is Negative.  Fact Sheet for Patients:  BloggerCourse.com  Fact Sheet for Healthcare Providers:  SeriousBroker.it  This test is no t yet approved or cleared by the [place] FDA and  has been authorized for detection and/or diagnosis of SARS-CoV-2 by FDA under an Emergency Use Authorization (EUA). This EUA will remain  in effect (meaning this test can be used) for the duration of the COVID-19 declaration under Section 564(b)(1) of the Act, 21 U.S.C.section 360bbb-3(b)(1), unless the authorization is terminated  or revoked sooner.       Influenza A by PCR NEGATIVE NEGATIVE Final   Influenza B by PCR NEGATIVE NEGATIVE Final    Comment: (NOTE) The Xpert Xpress SARS-CoV-2/FLU/RSV plus assay is intended as an aid in the diagnosis of influenza from Nasopharyngeal swab specimens and should not be used as a sole basis for treatment. Nasal washings and aspirates are unacceptable for Xpert Xpress SARS-CoV-2/FLU/RSV testing.  Fact Sheet for Patients: BloggerCourse.com  Fact Sheet for Healthcare  Providers: SeriousBroker.it  This test is not yet approved or cleared by the [place] FDA and has been authorized for detection and/or diagnosis of SARS-CoV-2 by FDA under an Emergency Use Authorization (EUA). This EUA will remain in effect (meaning this test can be used) for the duration of the COVID-19 declaration under Section 564(b)(1) of the Act, 21 U.S.C. section 360bbb-3(b)(1), unless the authorization is terminated or revoked.  Performed at [facility], [address] [place], [place] [address]      Labs: BNP (last 3 results) Recent Labs    03/05/21 2001  BNP 220.9*   Basic Metabolic Panel: Recent Labs  Lab 03/05/21 2001 03/06/21 0622  NA 135 135  K 4.8 4.0  CL 95* 98  CO2 32 29  GLUCOSE 151* 118*  BUN 17 15  CREATININE 1.04* 0.87  CALCIUM 8.9 8.5*   Liver Function Tests: Recent Labs  Lab 03/05/21 2001 03/06/21 0622  AST 18 14*  ALT 11 10  ALKPHOS 59 54  BILITOT 0.8 0.9  PROT 7.7 6.8  ALBUMIN 3.7 3.5   No results for input(s): LIPASE, AMYLASE in the last 168 hours. No results for input(s): AMMONIA in the last 168 hours. CBC: Recent Labs  Lab 03/05/21 2001 03/06/21 0622  WBC 12.7* 10.5  NEUTROABS 10.6*  --   HGB 13.4 12.6  HCT 41.6 38.6  MCV 99.3 99.5  PLT 188 168   Cardiac Enzymes: No results for input(s): CKTOTAL, CKMB, CKMBINDEX, TROPONINI in the last 168 hours. BNP: Invalid input(s): POCBNP CBG: No results for input(s): GLUCAP in the last 168 hours. D-Dimer Recent Labs    03/05/21 2237  DDIMER 16.94*   Hgb A1c Recent Labs    03/05/21 2237  HGBA1C 5.7*   Lipid Profile No results for input(s): CHOL, HDL, LDLCALC, TRIG, CHOLHDL, LDLDIRECT in the last 72 hours. Thyroid function studies No results for input(s): TSH, T4TOTAL, T3FREE, THYROIDAB in the last 72 hours.  Invalid input(s): FREET3 Anemia work up No results for input(s):  VITAMINB12, FOLATE, FERRITIN, TIBC, IRON, RETICCTPCT  in the last 72 hours. Urinalysis    Component Value Date/Time   COLORURINE YELLOW (A) 03/06/2021 0830   APPEARANCEUR CLOUDY (A) 03/06/2021 0830   APPEARANCEUR Clear 02/22/2015 1630   LABSPEC 1.014 03/06/2021 0830   LABSPEC 1.005 02/08/2014 1444   PHURINE 6.0 03/06/2021 0830   GLUCOSEU NEGATIVE 03/06/2021 0830   GLUCOSEU Negative 02/08/2014 1444   HGBUR NEGATIVE 03/06/2021 0830   BILIRUBINUR NEGATIVE 03/06/2021 0830   BILIRUBINUR Negative 02/22/2015 1630   BILIRUBINUR Negative 02/08/2014 1444   KETONESUR NEGATIVE 03/06/2021 0830   PROTEINUR NEGATIVE 03/06/2021 0830   UROBILINOGEN 0.2 06/04/2013 2110   NITRITE NEGATIVE 03/06/2021 0830   LEUKOCYTESUR LARGE (A) 03/06/2021 0830   LEUKOCYTESUR 3+ 02/08/2014 1444   Sepsis Labs Invalid input(s): PROCALCITONIN,  WBC,  LACTICIDVEN Microbiology Recent Results (from the past 240 hour(s))  Resp Panel by RT-PCR (Flu A&B, Covid) Nasopharyngeal Swab     Status: None   Collection Time: 03/06/21 12:13 PM   Specimen: Nasopharyngeal Swab; Nasopharyngeal(NP) swabs in vial transport medium  Result Value Ref Range Status   SARS Coronavirus 2 by RT PCR NEGATIVE NEGATIVE Final    Comment: (NOTE) SARS-CoV-2 target nucleic acids are NOT DETECTED.  The SARS-CoV-2 RNA is generally detectable in upper respiratory specimens during the acute phase of infection. The lowest concentration of SARS-CoV-2 viral copies this assay can detect is 138 copies/mL. A negative result does not preclude SARS-Cov-2 infection and should not be used as the sole basis for treatment or other patient management decisions. A negative result may occur with  improper specimen collection/handling, submission of specimen other than nasopharyngeal swab, presence of viral mutation(s) within the areas targeted by this assay, and inadequate number of viral copies(<138 copies/mL). A negative result must be combined with clinical observations, patient history, and  epidemiological information. The expected result is Negative.  Fact Sheet for Patients:  BloggerCourse.com  Fact Sheet for Healthcare Providers:  SeriousBroker.it  This test is no t yet approved or cleared by the [place] FDA and  has been authorized for detection and/or diagnosis of SARS-CoV-2 by FDA under an Emergency Use Authorization (EUA). This EUA will remain  in effect (meaning this test can be used) for the duration of the COVID-19 declaration under Section 564(b)(1) of the Act, 21 U.S.C.section 360bbb-3(b)(1), unless the authorization is terminated  or revoked sooner.       Influenza A by PCR NEGATIVE NEGATIVE Final   Influenza B by PCR NEGATIVE NEGATIVE Final    Comment: (NOTE) The Xpert Xpress SARS-CoV-2/FLU/RSV plus assay is intended as an aid in the diagnosis of influenza from Nasopharyngeal swab specimens and should not be used as a sole basis for treatment. Nasal washings and aspirates are unacceptable for Xpert Xpress SARS-CoV-2/FLU/RSV testing.  Fact Sheet for Patients: BloggerCourse.com  Fact Sheet for Healthcare Providers: SeriousBroker.it  This test is not yet approved or cleared by the [place] FDA and has been authorized for detection and/or diagnosis of SARS-CoV-2 by FDA under an Emergency Use Authorization (EUA). This EUA will remain in effect (meaning this test can be used) for the duration of the COVID-19 declaration under Section 564(b)(1) of the Act, 21 U.S.C. section 360bbb-3(b)(1), unless the authorization is terminated or revoked.  Performed at [facility], [address], [place], [place] [address]      Time coordinating discharge: Over 30 minutes  SIGNED:   Alvira Philips [place], DO  [facility] 03/07/2021, 9:51 AM

## 2021-03-08 LAB — URINE CULTURE: Culture: >=100000 — AB

## 2021-03-21 DEATH — deceased

## 2021-04-25 ENCOUNTER — Encounter: Payer: Self-pay | Admitting: Internal Medicine
